# Patient Record
Sex: Female | Born: 1935 | Race: White | Hispanic: No | Smoking: Never smoker
Health system: Southern US, Community
[De-identification: ages and names within clinical notes are randomized; demographics above are authoritative.]

## PROBLEM LIST (undated history)

## (undated) DIAGNOSIS — M1990 Unspecified osteoarthritis, unspecified site: Secondary | ICD-10-CM

## (undated) DIAGNOSIS — I10 Essential (primary) hypertension: Secondary | ICD-10-CM

## (undated) DIAGNOSIS — R51 Headache: Secondary | ICD-10-CM

## (undated) DIAGNOSIS — Z8719 Personal history of other diseases of the digestive system: Secondary | ICD-10-CM

## (undated) DIAGNOSIS — R0600 Dyspnea, unspecified: Secondary | ICD-10-CM

## (undated) DIAGNOSIS — J449 Chronic obstructive pulmonary disease, unspecified: Secondary | ICD-10-CM

## (undated) DIAGNOSIS — I671 Cerebral aneurysm, nonruptured: Secondary | ICD-10-CM

## (undated) DIAGNOSIS — G4730 Sleep apnea, unspecified: Secondary | ICD-10-CM

## (undated) DIAGNOSIS — Z8672 Personal history of thrombophlebitis: Secondary | ICD-10-CM

## (undated) DIAGNOSIS — Z972 Presence of dental prosthetic device (complete) (partial): Secondary | ICD-10-CM

## (undated) DIAGNOSIS — R42 Dizziness and giddiness: Secondary | ICD-10-CM

## (undated) DIAGNOSIS — K219 Gastro-esophageal reflux disease without esophagitis: Secondary | ICD-10-CM

## (undated) DIAGNOSIS — E039 Hypothyroidism, unspecified: Secondary | ICD-10-CM

## (undated) DIAGNOSIS — J189 Pneumonia, unspecified organism: Secondary | ICD-10-CM

## (undated) DIAGNOSIS — M810 Age-related osteoporosis without current pathological fracture: Secondary | ICD-10-CM

## (undated) DIAGNOSIS — R519 Headache, unspecified: Secondary | ICD-10-CM

## (undated) DIAGNOSIS — R011 Cardiac murmur, unspecified: Secondary | ICD-10-CM

## (undated) DIAGNOSIS — F32A Depression, unspecified: Secondary | ICD-10-CM

## (undated) DIAGNOSIS — F329 Major depressive disorder, single episode, unspecified: Secondary | ICD-10-CM

## (undated) HISTORY — DX: Headache: R51

## (undated) HISTORY — DX: Depression, unspecified: F32.A

## (undated) HISTORY — DX: Headache, unspecified: R51.9

## (undated) HISTORY — PX: COLONOSCOPY: SHX174

## (undated) HISTORY — DX: Essential (primary) hypertension: I10

## (undated) HISTORY — PX: EYE SURGERY: SHX253

## (undated) HISTORY — DX: Gastro-esophageal reflux disease without esophagitis: K21.9

## (undated) HISTORY — DX: Sleep apnea, unspecified: G47.30

## (undated) HISTORY — PX: REPLACEMENT TOTAL KNEE: SUR1224

## (undated) HISTORY — PX: BREAST BIOPSY: SHX20

## (undated) HISTORY — PX: APPENDECTOMY: SHX54

---
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
## 2005-04-04 ENCOUNTER — Ambulatory Visit: Payer: Self-pay | Admitting: Physical Medicine & Rehabilitation

## 2005-04-04 ENCOUNTER — Inpatient Hospital Stay (HOSPITAL_COMMUNITY): Admission: RE | Admit: 2005-04-04 | Discharge: 2005-04-07 | Payer: Self-pay | Admitting: Orthopaedic Surgery

## 2005-04-07 ENCOUNTER — Inpatient Hospital Stay
Admission: RE | Admit: 2005-04-07 | Discharge: 2005-04-12 | Payer: Self-pay | Admitting: Physical Medicine & Rehabilitation

## 2005-04-10 HISTORY — PX: REPLACEMENT TOTAL KNEE: SUR1224

## 2005-05-04 ENCOUNTER — Encounter: Payer: Self-pay | Admitting: Orthopaedic Surgery

## 2005-05-11 ENCOUNTER — Encounter: Payer: Self-pay | Admitting: Orthopaedic Surgery

## 2005-06-10 ENCOUNTER — Encounter: Payer: Self-pay | Admitting: Orthopaedic Surgery

## 2007-05-28 ENCOUNTER — Ambulatory Visit: Payer: Self-pay | Admitting: Family Medicine

## 2007-10-07 ENCOUNTER — Ambulatory Visit: Payer: Self-pay | Admitting: Gastroenterology

## 2008-07-07 ENCOUNTER — Ambulatory Visit: Payer: Self-pay | Admitting: Family Medicine

## 2009-10-13 ENCOUNTER — Ambulatory Visit: Payer: Self-pay | Admitting: Family Medicine

## 2010-04-12 ENCOUNTER — Emergency Department: Payer: Self-pay | Admitting: Emergency Medicine

## 2010-07-20 ENCOUNTER — Ambulatory Visit: Payer: Self-pay | Admitting: Family Medicine

## 2010-08-20 ENCOUNTER — Observation Stay: Payer: Self-pay | Admitting: Internal Medicine

## 2010-09-12 ENCOUNTER — Emergency Department: Payer: Self-pay | Admitting: Emergency Medicine

## 2011-02-09 ENCOUNTER — Emergency Department: Payer: Self-pay | Admitting: Emergency Medicine

## 2011-06-15 ENCOUNTER — Emergency Department: Payer: Self-pay | Admitting: Emergency Medicine

## 2011-08-09 ENCOUNTER — Ambulatory Visit: Payer: Self-pay | Admitting: Family Medicine

## 2011-09-18 ENCOUNTER — Emergency Department: Payer: Self-pay | Admitting: Emergency Medicine

## 2012-03-28 IMAGING — NM NM MYOCARD GATED
7 series · 42 of 42 positions shown · non-contrast
Comparison: none

REASON FOR EXAM: chest pain
COMMENTS:

PROCEDURE:     NM  - NM GATED MYOCARDIAL ST SCAN [DATE]OF[DATE]  - [DATE] [DATE] [DATE] [DATE]
RESULT:
HISTORY: This is a 80-year-old with chest pain. The patient underwent a
Lexiscan infusion over 1 minute with a peak heart rate of 107 beats per
minute.  Peak systolic blood pressure 160/83, baseline 73; 150/71. The
patient had no symptoms. Baseline EKG shows normal sinus rhythm with
nonspecific ST and T-wave changes. Peak stress shows no electrocardiographic
changes or arrhythmia. The patient received 12.87 mCi of Sestamibi at rest
and 29.01 mCi of Sestamibi peak stress intravenously in the left wrist.

[Series 1000: rest mibi (recon) · 6.6mm · 6.59mm/px · 6 of 27 frames shown]
[frame 3/27]
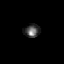
[frame 7/27]
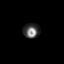
[frame 12/27]
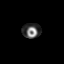
[frame 16/27]
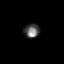
[frame 21/27]
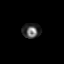
[frame 25/27]
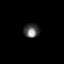

[Series 1000: gated stress myoview · 6.59mm/px · 6 of 64 frames shown]
[frame 6/64]
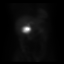
[frame 16/64]
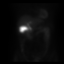
[frame 27/64]
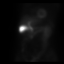
[frame 38/64]
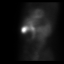
[frame 48/64]
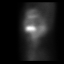
[frame 59/64]
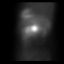

[Series 1000: rest mibi · 6.59mm/px · 6 of 64 frames shown]
[frame 6/64]
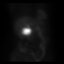
[frame 16/64]
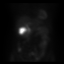
[frame 27/64]
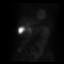
[frame 38/64]
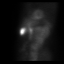
[frame 48/64]
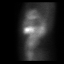
[frame 59/64]
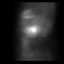

[Series 1000: gated stress myoview (recon) · 6.6mm · 6.59mm/px · 6 of 28 frames shown]
[frame 3/28]
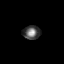
[frame 7/28]
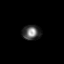
[frame 12/28]
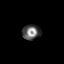
[frame 17/28]
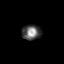
[frame 21/28]
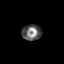
[frame 26/28]
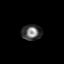

[Series 1000: gated stress myoview-gated (corrected) · 6.59mm/px · 6 of 512 frames shown]
[frame 43/512]
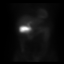
[frame 128/512]
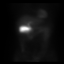
[frame 214/512]
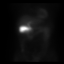
[frame 299/512]
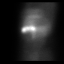
[frame 384/512]
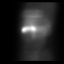
[frame 470/512]
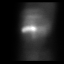

[Series 1000: gated stress myoview-gated · 6.59mm/px · 6 of 512 frames shown]
[frame 43/512]
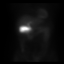
[frame 128/512]
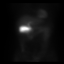
[frame 214/512]
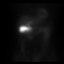
[frame 299/512]
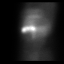
[frame 384/512]
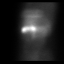
[frame 470/512]
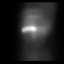

[Series 1000: gated stress myoview-gated (recon) · 6.6mm · 6.59mm/px · 6 of 224 frames shown]
[frame 19/224]
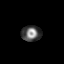
[frame 56/224]
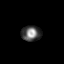
[frame 94/224]
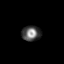
[frame 131/224]
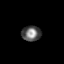
[frame 168/224]
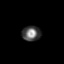
[frame 206/224]
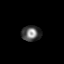

[42 of 42 positions shown; findings below may reference images not displayed]

IMAGE ANALYSIS: Normal LV systolic function with ejection fraction of 80%.
All myocardial walls have normal myocardial function and wall motion. Rest
and peak stress myocardial perfusion images were compared showing normal
myocardial perfusion at rest and peak stress without evidence of myocardial
ischemia.
IMPRESSION: 1.Normal Lexiscan infusion EKG without evidence of ischemia or arrhythmia.

2.Normal LV systolic function with ejection fraction of 80%.

## 2012-08-13 ENCOUNTER — Ambulatory Visit: Payer: Self-pay | Admitting: Family Medicine

## 2013-04-17 ENCOUNTER — Emergency Department: Payer: Self-pay | Admitting: Internal Medicine

## 2013-04-17 LAB — COMPREHENSIVE METABOLIC PANEL
Albumin: 3.3 g/dL — ABNORMAL LOW (ref 3.4–5.0)
Anion Gap: 6 — ABNORMAL LOW (ref 7–16)
BUN: 10 mg/dL (ref 7–18)
Bilirubin,Total: 0.4 mg/dL (ref 0.2–1.0)
Calcium, Total: 9 mg/dL (ref 8.5–10.1)
Chloride: 102 mmol/L (ref 98–107)
Co2: 29 mmol/L (ref 21–32)
EGFR (African American): >60
EGFR (Non-African Amer.): >60
Glucose: 87 mg/dL (ref 65–99)
Osmolality: 272 (ref 275–301)
Potassium: 3.9 mmol/L (ref 3.5–5.1)
SGOT(AST): 22 U/L (ref 15–37)
Sodium: 137 mmol/L (ref 136–145)
Total Protein: 6.8 g/dL (ref 6.4–8.2)

## 2013-04-17 LAB — PROTIME-INR
INR: 1
Prothrombin Time: 13.1 secs (ref 11.5–14.7)

## 2013-04-17 LAB — CK TOTAL AND CKMB (NOT AT ARMC)
CK, Total: 60 U/L (ref 21–215)
CK-MB: 0.9 ng/mL (ref 0.5–3.6)

## 2013-04-17 LAB — CBC
HGB: 12.8 g/dL (ref 12.0–16.0)
Platelet: 215 10*3/uL (ref 150–440)

## 2013-08-14 ENCOUNTER — Ambulatory Visit: Payer: Self-pay | Admitting: Family Medicine

## 2014-05-29 DIAGNOSIS — F419 Anxiety disorder, unspecified: Secondary | ICD-10-CM | POA: Insufficient documentation

## 2014-06-16 DIAGNOSIS — K625 Hemorrhage of anus and rectum: Secondary | ICD-10-CM | POA: Insufficient documentation

## 2014-06-16 DIAGNOSIS — R194 Change in bowel habit: Secondary | ICD-10-CM | POA: Insufficient documentation

## 2014-07-07 ENCOUNTER — Ambulatory Visit: Payer: Self-pay | Admitting: Gastroenterology

## 2014-07-09 LAB — PATHOLOGY REPORT

## 2014-10-07 ENCOUNTER — Ambulatory Visit: Payer: Self-pay | Admitting: Physician Assistant

## 2014-10-07 LAB — COMPREHENSIVE METABOLIC PANEL
ANION GAP: 5 — AB (ref 7–16)
AST: 21 U/L (ref 15–37)
Albumin: 3.4 g/dL (ref 3.4–5.0)
Alkaline Phosphatase: 57 U/L
BILIRUBIN TOTAL: 0.5 mg/dL (ref 0.2–1.0)
BUN: 9 mg/dL (ref 7–18)
CHLORIDE: 92 mmol/L — AB (ref 98–107)
Calcium, Total: 9.4 mg/dL (ref 8.5–10.1)
Co2: 30 mmol/L (ref 21–32)
Creatinine: 1.05 mg/dL (ref 0.60–1.30)
EGFR (African American): >60
GFR CALC NON AF AMER: 53 — AB
Glucose: 105 mg/dL — ABNORMAL HIGH (ref 65–99)
OSMOLALITY: 254 (ref 275–301)
Potassium: 4.6 mmol/L (ref 3.5–5.1)
SGPT (ALT): 17 U/L
SODIUM: 127 mmol/L — AB (ref 136–145)
TOTAL PROTEIN: 7.2 g/dL (ref 6.4–8.2)

## 2014-10-07 LAB — URINALYSIS, COMPLETE
Bacteria: NEGATIVE
Bilirubin,UR: NEGATIVE
Blood: NEGATIVE
GLUCOSE, UR: NEGATIVE
Ketone: NEGATIVE
LEUKOCYTE ESTERASE: NEGATIVE
Nitrite: NEGATIVE
PROTEIN: NEGATIVE
Ph: 7 (ref 5.0–8.0)
RBC, UR: NONE SEEN /HPF (ref 0–5)
SPECIFIC GRAVITY: 1.015 (ref 1.000–1.030)

## 2014-10-07 LAB — CBC WITH DIFFERENTIAL/PLATELET
Basophil #: 0 10*3/uL (ref 0.0–0.1)
Basophil %: 0.5 %
EOS ABS: 0.1 10*3/uL (ref 0.0–0.7)
Eosinophil %: 1.5 %
HCT: 39.6 % (ref 35.0–47.0)
HGB: 13.2 g/dL (ref 12.0–16.0)
LYMPHS PCT: 19.6 %
Lymphocyte #: 1.4 10*3/uL (ref 1.0–3.6)
MCH: 30.5 pg (ref 26.0–34.0)
MCHC: 33.4 g/dL (ref 32.0–36.0)
MCV: 91 fL (ref 80–100)
MONOS PCT: 9.4 %
Monocyte #: 0.7 x10 3/mm (ref 0.2–0.9)
NEUTROS ABS: 5 10*3/uL (ref 1.4–6.5)
NEUTROS PCT: 69 %
Platelet: 246 10*3/uL (ref 150–440)
RBC: 4.34 10*6/uL (ref 3.80–5.20)
RDW: 14.3 % (ref 11.5–14.5)
WBC: 7.3 10*3/uL (ref 3.6–11.0)

## 2014-11-19 ENCOUNTER — Emergency Department: Payer: Self-pay | Admitting: Internal Medicine

## 2014-11-19 LAB — CBC WITH DIFFERENTIAL/PLATELET
Basophil #: 0.1 10*3/uL (ref 0.0–0.1)
Basophil %: 0.6 %
Eosinophil #: 0.2 10*3/uL (ref 0.0–0.7)
Eosinophil %: 2.9 %
HCT: 38.1 % (ref 35.0–47.0)
HGB: 12.4 g/dL (ref 12.0–16.0)
Lymphocyte #: 1.9 10*3/uL (ref 1.0–3.6)
Lymphocyte %: 22.3 %
MCH: 30.5 pg (ref 26.0–34.0)
MCHC: 32.6 g/dL (ref 32.0–36.0)
MCV: 94 fL (ref 80–100)
Monocyte #: 0.5 x10 3/mm (ref 0.2–0.9)
Monocyte %: 5.8 %
Neutrophil #: 5.9 10*3/uL (ref 1.4–6.5)
Neutrophil %: 68.4 %
Platelet: 236 10*3/uL (ref 150–440)
RBC: 4.08 10*6/uL (ref 3.80–5.20)
RDW: 14.6 % — ABNORMAL HIGH (ref 11.5–14.5)
WBC: 8.6 10*3/uL (ref 3.6–11.0)

## 2014-11-19 LAB — BASIC METABOLIC PANEL
ANION GAP: 5 — AB (ref 7–16)
BUN: 11 mg/dL (ref 7–18)
CHLORIDE: 101 mmol/L (ref 98–107)
CREATININE: 0.89 mg/dL (ref 0.60–1.30)
Calcium, Total: 8.4 mg/dL — ABNORMAL LOW (ref 8.5–10.1)
Co2: 31 mmol/L (ref 21–32)
EGFR (African American): >60
EGFR (Non-African Amer.): >60
GLUCOSE: 101 mg/dL — AB (ref 65–99)
Osmolality: 273 (ref 275–301)
POTASSIUM: 4 mmol/L (ref 3.5–5.1)
SODIUM: 137 mmol/L (ref 136–145)

## 2014-11-19 LAB — TROPONIN I

## 2015-11-10 DIAGNOSIS — G4733 Obstructive sleep apnea (adult) (pediatric): Secondary | ICD-10-CM | POA: Insufficient documentation

## 2016-03-08 ENCOUNTER — Other Ambulatory Visit: Payer: Self-pay | Admitting: Family Medicine

## 2016-03-08 DIAGNOSIS — Z1231 Encounter for screening mammogram for malignant neoplasm of breast: Secondary | ICD-10-CM

## 2016-03-14 ENCOUNTER — Ambulatory Visit
Admission: RE | Admit: 2016-03-14 | Discharge: 2016-03-14 | Disposition: A | Discharge status: Home / Self Care | Payer: PPO | Source: Ambulatory Visit | Attending: Family Medicine | Admitting: Family Medicine

## 2016-03-14 DIAGNOSIS — Z1231 Encounter for screening mammogram for malignant neoplasm of breast: Secondary | ICD-10-CM

## 2016-04-12 ENCOUNTER — Other Ambulatory Visit: Payer: Self-pay | Admitting: Adult Health

## 2016-04-12 DIAGNOSIS — M79661 Pain in right lower leg: Secondary | ICD-10-CM

## 2016-04-12 DIAGNOSIS — M7989 Other specified soft tissue disorders: Principal | ICD-10-CM

## 2016-04-13 ENCOUNTER — Ambulatory Visit
Admission: RE | Admit: 2016-04-13 | Discharge: 2016-04-13 | Disposition: A | Discharge status: Home / Self Care | Payer: PPO | Source: Ambulatory Visit | Attending: Adult Health | Admitting: Adult Health

## 2016-04-13 DIAGNOSIS — M7989 Other specified soft tissue disorders: Secondary | ICD-10-CM | POA: Insufficient documentation

## 2016-04-13 DIAGNOSIS — M79661 Pain in right lower leg: Secondary | ICD-10-CM | POA: Insufficient documentation

## 2017-03-20 ENCOUNTER — Other Ambulatory Visit: Payer: Self-pay | Admitting: Ophthalmology

## 2017-03-20 DIAGNOSIS — H532 Diplopia: Secondary | ICD-10-CM

## 2017-03-29 ENCOUNTER — Ambulatory Visit: Payer: PPO

## 2017-03-30 ENCOUNTER — Ambulatory Visit: Payer: PPO

## 2017-04-02 ENCOUNTER — Ambulatory Visit
Admission: RE | Admit: 2017-04-02 | Discharge: 2017-04-02 | Disposition: A | Discharge status: Home / Self Care | Payer: Medicare PPO | Source: Ambulatory Visit | Attending: Ophthalmology | Admitting: Ophthalmology

## 2017-04-02 DIAGNOSIS — H532 Diplopia: Secondary | ICD-10-CM | POA: Diagnosis present

## 2017-04-02 DIAGNOSIS — I671 Cerebral aneurysm, nonruptured: Secondary | ICD-10-CM | POA: Insufficient documentation

## 2017-04-02 DIAGNOSIS — I6782 Cerebral ischemia: Secondary | ICD-10-CM | POA: Insufficient documentation

## 2017-04-02 LAB — POCT I-STAT CREATININE: Creatinine, Ser: 0.8 mg/dL (ref 0.44–1.00)

## 2017-04-02 MED ORDER — GADOBENATE DIMEGLUMINE 529 MG/ML IV SOLN
15.0000 mL | Freq: Once | INTRAVENOUS | Status: AC | PRN
  Administered 2017-04-02: 13 mL via INTRAVENOUS

## 2017-04-10 ENCOUNTER — Other Ambulatory Visit: Payer: Self-pay | Admitting: Neurosurgery

## 2017-04-10 DIAGNOSIS — I671 Cerebral aneurysm, nonruptured: Secondary | ICD-10-CM

## 2017-04-17 ENCOUNTER — Ambulatory Visit (HOSPITAL_COMMUNITY)
Admission: RE | Admit: 2017-04-17 | Discharge: 2017-04-17 | Disposition: A | Discharge status: Home / Self Care | Payer: Medicare PPO | Source: Ambulatory Visit | Attending: Neurosurgery | Admitting: Neurosurgery

## 2017-04-17 ENCOUNTER — Other Ambulatory Visit: Payer: Self-pay | Admitting: Neurosurgery

## 2017-04-17 DIAGNOSIS — I671 Cerebral aneurysm, nonruptured: Secondary | ICD-10-CM | POA: Insufficient documentation

## 2017-04-17 DIAGNOSIS — H532 Diplopia: Secondary | ICD-10-CM | POA: Insufficient documentation

## 2017-04-17 DIAGNOSIS — Z7982 Long term (current) use of aspirin: Secondary | ICD-10-CM | POA: Insufficient documentation

## 2017-04-17 DIAGNOSIS — Z79899 Other long term (current) drug therapy: Secondary | ICD-10-CM | POA: Insufficient documentation

## 2017-04-17 HISTORY — PX: IR ANGIO VERTEBRAL SEL VERTEBRAL UNI L MOD SED: IMG5367

## 2017-04-17 HISTORY — PX: IR ANGIO INTRA EXTRACRAN SEL COM CAROTID INNOMINATE BILAT MOD SED: IMG5360

## 2017-04-17 LAB — URINALYSIS, ROUTINE W REFLEX MICROSCOPIC
BACTERIA UA: NONE SEEN
Bilirubin Urine: NEGATIVE
Glucose, UA: NEGATIVE mg/dL
Hgb urine dipstick: NEGATIVE
Ketones, ur: NEGATIVE mg/dL
Nitrite: NEGATIVE
PROTEIN: NEGATIVE mg/dL
Specific Gravity, Urine: 1.011 (ref 1.005–1.030)
pH: 7 (ref 5.0–8.0)

## 2017-04-17 LAB — CBC WITH DIFFERENTIAL/PLATELET
BASOS ABS: 0.1 10*3/uL (ref 0.0–0.1)
Basophils Relative: 1 %
EOS PCT: 3 %
Eosinophils Absolute: 0.2 10*3/uL (ref 0.0–0.7)
HEMATOCRIT: 29.9 % — AB (ref 36.0–46.0)
HEMOGLOBIN: 9.2 g/dL — AB (ref 12.0–15.0)
LYMPHS PCT: 34 %
Lymphs Abs: 1.8 10*3/uL (ref 0.7–4.0)
MCH: 23.1 pg — ABNORMAL LOW (ref 26.0–34.0)
MCHC: 30.8 g/dL (ref 30.0–36.0)
MCV: 74.9 fL — AB (ref 78.0–100.0)
Monocytes Absolute: 0.4 10*3/uL (ref 0.1–1.0)
Monocytes Relative: 8 %
NEUTROS PCT: 54 %
Neutro Abs: 2.9 10*3/uL (ref 1.7–7.7)
Platelets: 250 10*3/uL (ref 150–400)
RBC: 3.99 MIL/uL (ref 3.87–5.11)
RDW: 16.7 % — AB (ref 11.5–15.5)
WBC: 5.4 10*3/uL (ref 4.0–10.5)

## 2017-04-17 LAB — BASIC METABOLIC PANEL
Anion gap: 7 (ref 5–15)
BUN: 11 mg/dL (ref 6–20)
CHLORIDE: 104 mmol/L (ref 101–111)
CO2: 28 mmol/L (ref 22–32)
Calcium: 9.2 mg/dL (ref 8.9–10.3)
Creatinine, Ser: 0.71 mg/dL (ref 0.44–1.00)
GFR calc Af Amer: >60 mL/min (ref >60)
GFR calc non Af Amer: >60 mL/min (ref >60)
GLUCOSE: 93 mg/dL (ref 65–99)
POTASSIUM: 4 mmol/L (ref 3.5–5.1)
Sodium: 139 mmol/L (ref 135–145)

## 2017-04-17 LAB — PROTIME-INR
INR: 1.08
Prothrombin Time: 14 seconds (ref 11.4–15.2)

## 2017-04-17 LAB — APTT: APTT: 33 s (ref 24–36)

## 2017-04-17 MED ORDER — LIDOCAINE HCL 1 % IJ SOLN
INTRAMUSCULAR | Status: AC | PRN
  Administered 2017-04-17: 10 mL

## 2017-04-17 MED ORDER — IOPAMIDOL (ISOVUE-300) INJECTION 61%
INTRAVENOUS | Status: AC
  Administered 2017-04-17: 50 mL
  Filled 2017-04-17: qty 150

## 2017-04-17 MED ORDER — MIDAZOLAM HCL 2 MG/2ML IJ SOLN
INTRAMUSCULAR | Status: AC
  Filled 2017-04-17: qty 2

## 2017-04-17 MED ORDER — HEPARIN SODIUM (PORCINE) 1000 UNIT/ML IJ SOLN
INTRAMUSCULAR | Status: AC
  Filled 2017-04-17: qty 3

## 2017-04-17 MED ORDER — SODIUM CHLORIDE 0.9 % IV SOLN: INTRAVENOUS | Status: DC

## 2017-04-17 MED ORDER — HEPARIN SODIUM (PORCINE) 1000 UNIT/ML IJ SOLN
INTRAMUSCULAR | Status: AC | PRN
  Administered 2017-04-17: 2000 [IU] via INTRAVENOUS

## 2017-04-17 MED ORDER — FENTANYL CITRATE (PF) 100 MCG/2ML IJ SOLN
INTRAMUSCULAR | Status: AC | PRN
  Administered 2017-04-17 (×2): 25 ug via INTRAVENOUS

## 2017-04-17 MED ORDER — MIDAZOLAM HCL 2 MG/2ML IJ SOLN
INTRAMUSCULAR | Status: AC | PRN
  Administered 2017-04-17: 0.5 mg via INTRAVENOUS
  Administered 2017-04-17: 1 mg via INTRAVENOUS

## 2017-04-17 MED ORDER — FENTANYL CITRATE (PF) 100 MCG/2ML IJ SOLN
INTRAMUSCULAR | Status: AC
  Filled 2017-04-17: qty 2

## 2017-04-17 MED ORDER — HYDROCODONE-ACETAMINOPHEN 5-325 MG PO TABS: 1.0000 | ORAL_TABLET | ORAL | Status: DC | PRN

## 2017-04-17 MED ORDER — LIDOCAINE HCL 1 % IJ SOLN
INTRAMUSCULAR | Status: AC
  Filled 2017-04-17: qty 20

## 2017-04-17 NOTE — Discharge Instructions (Signed)

## 2017-04-17 NOTE — H&P (Signed)
CC:  Aneurysm  HPI: Mrs. Ferd GlassingFarrell is a 83 year old woman I am seeing for the above at the request of Dr. 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 She initially began experiencing the double vision about 3 or 4 months ago. Initially, she was wearing fruste lenses to see if that would correct the problem, however she says that the double vision has not improved at all. She therefore underwent further testing which included MRI of the brain which demonstrated likely possibility of a right internal carotid artery aneurysm. She was therefore referred for neurosurgical evaluation. Aside from the double vision which she says is worse when looking towards the right, she does not have associated symptoms of headache, numbness, tingling, or weakness of the extremities. She has not had any difficulty walking, imbalance, or falls. She does have a daughter in law with intracranial aneurysms, but no blood relatives that she knows of. She does have a history of hypertension which appears well controlled, and is not a smoker.   PMH: No past medical history on file.  PSH: Past Surgical History:  Procedure Laterality Date  . BREAST BIOPSY Right 1970's   EXCISIONAL - NEG    SH: Social History  Substance Use Topics  . Smoking status: Not on file  . Smokeless tobacco: Not on file  . Alcohol use Not on file    MEDS: Prior to Admission medications   Not on File    ALLERGY: Allergies not on file  ROS: ROS  NEUROLOGIC EXAM: Awake, alert, oriented Memory and concentration grossly intact Speech fluent, appropriate CN grossly intact Motor exam: Upper Extremities Deltoid Bicep Tricep Grip  Right 5/5 5/5 5/5 5/5  Left 5/5 5/5 5/5 5/5   Lower Extremity IP Quad PF DF EHL  Right 5/5 5/5 5/5 5/5 5/5  Left 5/5 5/5 5/5 5/5 5/5   Sensation grossly intact to LT  [facility]: MRI of the brain was reviewed. This does demonstrate flow void in the region of  the right cavernous sinus measuring approximately 1.4 cm. This likely indicates cavernous segment right-sided internal carotid artery aneurysm.  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 I suspect this is likely due to direct compression of the 6th nerve within the cavernous sinus.  PLAN: we will plan on proceeding with diagnostic cerebral angiogram   I did review the MRI findings with the patient and her son in the office. Treatment options at this point were discussed, including no further treatment. I explained to them that given the location it is unlikely that she would suffer intracranial hemorrhage from rupture. I did also explained to them that without treatment it is unlikely that her diplopia would improve. I explained to her that treatment of the aneurysm with a flow diverter device may allow the aneurysm to regress and therefore provide some chance of improvement, although this is certainly not guaranteed. All her questions were answered, and she does wish to proceed with angiogram to gather more information.

## 2017-04-23 ENCOUNTER — Encounter (HOSPITAL_COMMUNITY): Payer: Self-pay | Admitting: Neurosurgery

## 2017-12-19 ENCOUNTER — Other Ambulatory Visit: Payer: Self-pay | Admitting: Unknown Physician Specialty

## 2017-12-19 ENCOUNTER — Ambulatory Visit
Admission: RE | Admit: 2017-12-19 | Discharge: 2017-12-19 | Disposition: A | Discharge status: Home / Self Care | Payer: Medicare PPO | Source: Ambulatory Visit | Attending: Unknown Physician Specialty | Admitting: Unknown Physician Specialty

## 2017-12-19 DIAGNOSIS — K449 Diaphragmatic hernia without obstruction or gangrene: Secondary | ICD-10-CM | POA: Insufficient documentation

## 2017-12-19 DIAGNOSIS — R05 Cough: Secondary | ICD-10-CM | POA: Diagnosis present

## 2017-12-19 DIAGNOSIS — R059 Cough, unspecified: Secondary | ICD-10-CM

## 2017-12-19 DIAGNOSIS — I700 Atherosclerosis of aorta: Secondary | ICD-10-CM | POA: Insufficient documentation

## 2017-12-19 DIAGNOSIS — R918 Other nonspecific abnormal finding of lung field: Secondary | ICD-10-CM | POA: Insufficient documentation

## 2017-12-21 ENCOUNTER — Ambulatory Visit: Payer: Medicare PPO | Admitting: Internal Medicine

## 2017-12-21 ENCOUNTER — Encounter: Payer: Self-pay | Admitting: Internal Medicine

## 2017-12-21 VITALS — BP 122/74 | HR 58 | Ht 61.5 in | Wt 142.0 lb

## 2017-12-21 DIAGNOSIS — J449 Chronic obstructive pulmonary disease, unspecified: Secondary | ICD-10-CM | POA: Diagnosis not present

## 2017-12-21 DIAGNOSIS — G4733 Obstructive sleep apnea (adult) (pediatric): Secondary | ICD-10-CM | POA: Diagnosis not present

## 2017-12-21 DIAGNOSIS — R0602 Shortness of breath: Secondary | ICD-10-CM

## 2017-12-21 DIAGNOSIS — R053 Chronic cough: Secondary | ICD-10-CM

## 2017-12-21 DIAGNOSIS — R05 Cough: Secondary | ICD-10-CM

## 2017-12-21 MED ORDER — BENZONATATE 100 MG PO CAPS: 100.0000 mg | ORAL_CAPSULE | Freq: Three times a day (TID) | ORAL | 3 refills | Status: DC

## 2017-12-21 NOTE — Progress Notes (Addendum)
[facility] Pulmonary Medicine Consultation      Assessment and Plan:  Right hilar fullness. -We will schedule CT chest.  Will contact the patient once we have results.  Chronic cough. -Chronic cough of 2 years, dry. -This has responded to Tessalon in the past, we will restart Tessalon.  Emphysema/COPD. - Demonstrated on chest x-ray with hyperinflation. -Currently the patient appears to be asymptomatic from this, we will not start inhalers at this time, if she does become symptomatically future can consider starting. -Patient remains fairly active she is encouraged to continue to do so.  Obstructive sleep apnea. - Currently on CPAP, doing well.  Addendum:  Ct chest imaging personally reviewed; large hiatal hernia, air trapping c/w emphysema, small nodules. Will start Breo, pt informed.    Date: 12/21/2017  MRN# [id number] Merlin Ege 08/03/1929   Briana Lozano is a 84 y.o. old female seen in consultation for chief complaint of:    Chief Complaint  Patient presents with  . pulmonary consult    per Dr. Jenne Campus- CXR 12/19/17. pt reports non prod cough &sob with exertion x2y, voice hoarseness x53m    HPI:  She is present with her son who gives some of the history. She has been hoarse for about 2 months and has throat pain.  She has never been diagnosed with COPD, she has had asthma in the past. She currently takes no inhalers. She gets winded with walking short distances, carrying laundry. She does have trouble with getting dressed or bathing. She takes no inhalers. She has had a cough, which has been dry for about 2 years. She was taken off ACEi, but did not help.  She has sinus drainage, she uses occasional nasal spray. She takes occasional benadryl which helps.  She has rare reflux/heartburn.  She has no pets at home.  She lives by herself, she drives a car, go a dance twice per week, working there.   She has OSA and is on CPAP for 6 months, she uses it  every night except for one night except the day she gets her hair done.    She has never smoked, no occupational exposures. Her dad and brother had lung cancer.   Imaging personally reviewed, chest x-ray 12/19/17; right hilar fullness, increased interstitial markings, chronic bronchitis changes, hiatal hernia.  Hyperinflation consistent with emphysema.     PMHX:   Past Medical History:  Diagnosis Date  . Depression   . GERD (gastroesophageal reflux disease)   . Headache   . Hiatal hernia   . Hypertension   . Obesity   . Overactive bladder   . Sleep apnea    Surgical Hx:  Past Surgical History:  Procedure Laterality Date  . BREAST BIOPSY    . BREAST BIOPSY Right    EXCISIONAL - NEG  . COLONOSCOPY    . EYE SURGERY    . GLAUCOMA SURGERY    . HERNIA REPAIR    . IR ANGIO INTRA EXTRACRAN SEL COM CAROTID INNOMINATE BILAT MOD SED  04/17/2017  . IR ANGIO VERTEBRAL SEL VERTEBRAL UNI L MOD SED  04/17/2017  . REPLACEMENT TOTAL KNEE     Family Hx:  Family History  Problem Relation Age of Onset  . Stroke Mother   . Lung cancer Father   . Lung cancer Brother   . Breast cancer Neg Hx    Social Hx:   Social History   Tobacco Use  . Smoking status: Never Smoker  . Smokeless tobacco:  Never Used  Substance Use Topics  . Alcohol use: No    Frequency: Never  . Drug use: No   Medication:    Current Outpatient Medications:  .  aspirin 325 MG tablet, Take 325 mg by mouth daily., Disp: , Rfl:  .  bisoprolol-hydrochlorothiazide (ZIAC) 5-6.25 MG tablet, Take 0.5 tablets by mouth 2 (two) times daily., Disp: , Rfl:  .  Cholecalciferol (VITAMIN D3) 1000 units CAPS, Take 1,000 Units by mouth daily., Disp: , Rfl:  .  citalopram (CELEXA) 20 MG tablet, Take 20 mg by mouth daily., Disp: , Rfl:  .  cyanocobalamin (,VITAMIN B-12,) 1000 MCG/ML injection, Inject 1,000 mcg into the muscle every 30 (thirty) days., Disp: , Rfl:  .  guaiFENesin (MUCINEX) 600 MG 12 hr tablet, Take 600 mg by mouth 2 (two)  times daily as needed for cough or to loosen phlegm., Disp: , Rfl:  .  levothyroxine (SYNTHROID, LEVOTHROID) 100 MCG tablet, Take 100 mcg by mouth daily before breakfast., Disp: , Rfl:  .  loratadine (CLARITIN) 10 MG tablet, Take 10 mg by mouth daily., Disp: , Rfl:  .  losartan (COZAAR) 100 MG tablet, Take by mouth., Disp: , Rfl:  .  Multiple Vitamins-Minerals (PRESERVISION AREDS 2 PO), Take 1 capsule by mouth 2 (two) times daily., Disp: , Rfl:  .  Polyethyl Glycol-Propyl Glycol (SYSTANE OP), Place 1 drop into both eyes daily., Disp: , Rfl:  .  polyethylene glycol (MIRALAX / GLYCOLAX) packet, Take 17 g by mouth 2 (two) times a week., Disp: , Rfl:    Allergies:  Penicillins and Valium [diazepam]  Review of Systems: Gen:  Denies  fever, sweats, chills HEENT: Denies blurred vision, double vision. bleeds, sore throat Cvc:  No dizziness, chest pain. Resp:   Denies cough or sputum production, shortness of breath Gi: Denies swallowing difficulty, stomach pain. Gu:  Denies bladder incontinence, burning urine Ext:   No Joint pain, stiffness. Skin: No skin rash,  hives  Endoc:  No polyuria, polydipsia. Psych: No depression, insomnia. Other:  All other systems were reviewed with the patient and were negative other that what is mentioned in the HPI.   Physical Examination:   VS: BP 122/74 (BP Location: Left Arm, Cuff Size: Normal)   Pulse (!) 58   Ht 5' 1.5" (1.562 m)   Wt 142 lb (64.4 kg)   SpO2 95%   BMI 26.40 kg/m   General Appearance: No distress  Neuro:without focal findings,  speech normal,  HEENT: PERRLA, EOM intact.   Pulmonary: normal breath sounds, No wheezing.  CardiovascularNormal S1,S2.  No m/r/g.   Abdomen: Benign, Soft, non-tender. Renal:  No costovertebral tenderness  GU:  No performed at this time. Endoc: No evident thyromegaly, no signs of acromegaly. Skin:   warm, no rashes, no ecchymosis  Extremities: normal, no cyanosis, clubbing.  Other findings:     LABORATORY PANEL:   CBC No results for input(s): WBC, HGB, HCT, PLT in the last 168 hours. ------------------------------------------------------------------------------------------------------------------  Chemistries  No results for input(s): NA, K, CL, CO2, GLUCOSE, BUN, CREATININE, CALCIUM, MG, AST, ALT, ALKPHOS, BILITOT in the last 168 hours.  Invalid input(s): GFRCGP ------------------------------------------------------------------------------------------------------------------  Cardiac Enzymes No results for input(s): TROPONINI in the last 168 hours. ------------------------------------------------------------  RADIOLOGY:  No results found.     Thank  you for the consultation and for allowing [facility] to assist in the care of your patient. Our recommendations are noted above.  Please contact us if we can  be of further service.   Wells Guiles, MD.  Board Certified in Internal Medicine, Pulmonary Medicine, Critical Care Medicine, and Sleep Medicine.  [facility] Office Number: [phone]  Santiago Glad, M.D.  Billy Fischer, M.D  12/21/2017

## 2017-12-21 NOTE — Patient Instructions (Signed)
Will send you for a CT chest.  

## 2018-01-04 ENCOUNTER — Other Ambulatory Visit: Payer: Self-pay | Admitting: Internal Medicine

## 2018-01-04 ENCOUNTER — Telehealth: Payer: Self-pay | Admitting: Internal Medicine

## 2018-01-04 DIAGNOSIS — Z01818 Encounter for other preprocedural examination: Secondary | ICD-10-CM

## 2018-01-04 NOTE — Telephone Encounter (Signed)
Patient calling in regards to the CT scan that was ordered Checking on status for when it will be scheduled Please call to advise

## 2018-01-04 NOTE — Progress Notes (Signed)
BMET ordered for CT scan scheduled 01/14/18.

## 2018-01-04 NOTE — Telephone Encounter (Signed)
[facility].

## 2018-01-08 ENCOUNTER — Other Ambulatory Visit
Admission: RE | Admit: 2018-01-08 | Discharge: 2018-01-08 | Disposition: A | Discharge status: Home / Self Care | Payer: Medicare PPO | Source: Ambulatory Visit | Attending: Internal Medicine | Admitting: Internal Medicine

## 2018-01-08 DIAGNOSIS — Z01818 Encounter for other preprocedural examination: Secondary | ICD-10-CM | POA: Diagnosis present

## 2018-01-08 LAB — BASIC METABOLIC PANEL
Anion gap: 7 (ref 5–15)
BUN: 10 mg/dL (ref 6–20)
CHLORIDE: 99 mmol/L — AB (ref 101–111)
CO2: 28 mmol/L (ref 22–32)
Calcium: 9 mg/dL (ref 8.9–10.3)
Creatinine, Ser: 0.73 mg/dL (ref 0.44–1.00)
GFR calc Af Amer: >60 mL/min (ref >60)
GFR calc non Af Amer: >60 mL/min (ref >60)
Glucose, Bld: 90 mg/dL (ref 65–99)
POTASSIUM: 3.9 mmol/L (ref 3.5–5.1)
SODIUM: 134 mmol/L — AB (ref 135–145)

## 2018-01-14 ENCOUNTER — Ambulatory Visit
Admission: RE | Admit: 2018-01-14 | Discharge: 2018-01-14 | Disposition: A | Discharge status: Home / Self Care | Payer: Medicare PPO | Source: Ambulatory Visit | Attending: Internal Medicine | Admitting: Internal Medicine

## 2018-01-14 DIAGNOSIS — R918 Other nonspecific abnormal finding of lung field: Secondary | ICD-10-CM | POA: Diagnosis not present

## 2018-01-14 DIAGNOSIS — R0602 Shortness of breath: Secondary | ICD-10-CM | POA: Diagnosis not present

## 2018-01-14 DIAGNOSIS — K449 Diaphragmatic hernia without obstruction or gangrene: Secondary | ICD-10-CM | POA: Insufficient documentation

## 2018-01-14 DIAGNOSIS — I700 Atherosclerosis of aorta: Secondary | ICD-10-CM | POA: Insufficient documentation

## 2018-01-14 MED ORDER — IOPAMIDOL (ISOVUE-300) INJECTION 61%
75.0000 mL | Freq: Once | INTRAVENOUS | Status: AC | PRN
  Administered 2018-01-14: 75 mL via INTRAVENOUS

## 2018-01-17 ENCOUNTER — Telehealth: Payer: Self-pay | Admitting: Internal Medicine

## 2018-01-17 DIAGNOSIS — I10 Essential (primary) hypertension: Secondary | ICD-10-CM | POA: Insufficient documentation

## 2018-01-17 DIAGNOSIS — E039 Hypothyroidism, unspecified: Secondary | ICD-10-CM | POA: Insufficient documentation

## 2018-01-17 DIAGNOSIS — I200 Unstable angina: Secondary | ICD-10-CM | POA: Insufficient documentation

## 2018-01-17 DIAGNOSIS — M810 Age-related osteoporosis without current pathological fracture: Secondary | ICD-10-CM | POA: Insufficient documentation

## 2018-01-17 DIAGNOSIS — H3530 Unspecified macular degeneration: Secondary | ICD-10-CM | POA: Insufficient documentation

## 2018-01-17 MED ORDER — FLUTICASONE FUROATE-VILANTEROL 200-25 MCG/INH IN AEPB: 1.0000 | INHALATION_SPRAY | Freq: Every day | RESPIRATORY_TRACT | 5 refills | Status: DC

## 2018-01-17 NOTE — Telephone Encounter (Signed)
Pt informed of results. Will send in Breo inhaler, please fax to her pharmacy in [place].

## 2018-01-17 NOTE — Addendum Note (Signed)
Addended by: Shane CrutchAMACHANDRAN, Leilanee Righetti on: 01/17/2018 02:23 PM   Modules accepted: Orders

## 2018-01-17 NOTE — Telephone Encounter (Signed)
done

## 2018-01-17 NOTE — Telephone Encounter (Signed)
Pt would like CT results. Please call. 

## 2018-01-18 NOTE — Telephone Encounter (Signed)
Advair Diskus change to Breo 200 mcg.

## 2018-01-21 ENCOUNTER — Telehealth: Payer: Self-pay | Admitting: Internal Medicine

## 2018-01-21 ENCOUNTER — Other Ambulatory Visit: Payer: Self-pay | Admitting: Internal Medicine

## 2018-01-21 MED ORDER — BUDESONIDE-FORMOTEROL FUMARATE 160-4.5 MCG/ACT IN AERO: 2.0000 | INHALATION_SPRAY | Freq: Two times a day (BID) | RESPIRATORY_TRACT | 6 refills | Status: DC

## 2018-01-21 MED ORDER — FLUTICASONE-SALMETEROL 250-50 MCG/DOSE IN AEPB: 1.0000 | INHALATION_SPRAY | Freq: Every day | RESPIRATORY_TRACT | 5 refills | Status: DC

## 2018-01-21 NOTE — Telephone Encounter (Signed)
Advair was the 1st medication sent then [facility]. What else can be prescribed?

## 2018-01-21 NOTE — Telephone Encounter (Signed)
Sent script for advair which should be generic.

## 2018-01-21 NOTE — Telephone Encounter (Signed)
Pt calling stating we prescribed patient Brio  It was costing her about $300   She is calling to see if there is a generic for this or if we can give her something that is lower in price  Please call back

## 2018-01-21 NOTE — Telephone Encounter (Signed)
symbicort 160 2 puffs twice daily, rinse mouth after use. If this is not covered, she will need to check with her insurance to see what medications are covered.

## 2018-01-21 NOTE — Telephone Encounter (Signed)
Pt made aware. Symbicort ordered Nothing further needed.

## 2018-01-22 ENCOUNTER — Telehealth: Payer: Self-pay | Admitting: *Deleted

## 2018-01-22 NOTE — Telephone Encounter (Signed)
PA pending Advair Diskus 250-50 mcg. Key: [id number]

## 2018-09-24 ENCOUNTER — Other Ambulatory Visit: Payer: Self-pay | Admitting: Gastroenterology

## 2018-09-24 DIAGNOSIS — R1013 Epigastric pain: Secondary | ICD-10-CM

## 2018-09-24 DIAGNOSIS — R140 Abdominal distension (gaseous): Secondary | ICD-10-CM

## 2018-09-24 DIAGNOSIS — R634 Abnormal weight loss: Secondary | ICD-10-CM

## 2018-09-24 DIAGNOSIS — R6881 Early satiety: Secondary | ICD-10-CM

## 2018-09-30 ENCOUNTER — Ambulatory Visit
Admission: RE | Admit: 2018-09-30 | Discharge: 2018-09-30 | Disposition: A | Discharge status: Home / Self Care | Payer: Medicare PPO | Source: Ambulatory Visit | Attending: Gastroenterology | Admitting: Gastroenterology

## 2018-09-30 DIAGNOSIS — K8020 Calculus of gallbladder without cholecystitis without obstruction: Secondary | ICD-10-CM | POA: Insufficient documentation

## 2018-09-30 DIAGNOSIS — I700 Atherosclerosis of aorta: Secondary | ICD-10-CM | POA: Diagnosis not present

## 2018-09-30 DIAGNOSIS — R634 Abnormal weight loss: Secondary | ICD-10-CM | POA: Diagnosis not present

## 2018-09-30 DIAGNOSIS — R6881 Early satiety: Secondary | ICD-10-CM | POA: Insufficient documentation

## 2018-09-30 DIAGNOSIS — K449 Diaphragmatic hernia without obstruction or gangrene: Secondary | ICD-10-CM | POA: Diagnosis not present

## 2018-09-30 DIAGNOSIS — M47896 Other spondylosis, lumbar region: Secondary | ICD-10-CM | POA: Diagnosis not present

## 2018-09-30 DIAGNOSIS — M4186 Other forms of scoliosis, lumbar region: Secondary | ICD-10-CM | POA: Insufficient documentation

## 2018-09-30 DIAGNOSIS — R1013 Epigastric pain: Secondary | ICD-10-CM | POA: Insufficient documentation

## 2018-09-30 DIAGNOSIS — R140 Abdominal distension (gaseous): Secondary | ICD-10-CM | POA: Diagnosis not present

## 2018-09-30 LAB — POCT I-STAT CREATININE: Creatinine, Ser: 0.8 mg/dL (ref 0.44–1.00)

## 2018-09-30 MED ORDER — IOPAMIDOL (ISOVUE-300) INJECTION 61%
80.0000 mL | Freq: Once | INTRAVENOUS | Status: AC | PRN
  Administered 2018-09-30: 85 mL via INTRAVENOUS

## 2018-10-09 ENCOUNTER — Other Ambulatory Visit: Payer: Self-pay | Admitting: Gastroenterology

## 2018-10-09 DIAGNOSIS — R935 Abnormal findings on diagnostic imaging of other abdominal regions, including retroperitoneum: Secondary | ICD-10-CM

## 2018-10-09 DIAGNOSIS — R6881 Early satiety: Secondary | ICD-10-CM

## 2018-10-09 DIAGNOSIS — R140 Abdominal distension (gaseous): Secondary | ICD-10-CM

## 2018-10-26 ENCOUNTER — Ambulatory Visit
Admission: RE | Admit: 2018-10-26 | Discharge: 2018-10-26 | Disposition: A | Discharge status: Home / Self Care | Payer: Medicare PPO | Source: Ambulatory Visit | Attending: Gastroenterology | Admitting: Gastroenterology

## 2018-10-26 DIAGNOSIS — R6881 Early satiety: Secondary | ICD-10-CM | POA: Diagnosis present

## 2018-10-26 DIAGNOSIS — R935 Abnormal findings on diagnostic imaging of other abdominal regions, including retroperitoneum: Secondary | ICD-10-CM | POA: Insufficient documentation

## 2018-10-26 DIAGNOSIS — R140 Abdominal distension (gaseous): Secondary | ICD-10-CM | POA: Diagnosis present

## 2018-10-26 MED ORDER — TECHNETIUM TC 99M SULFUR COLLOID
2.0000 | Freq: Once | INTRAVENOUS | Status: AC | PRN
  Administered 2018-10-26: 2.26 via ORAL

## 2019-01-28 ENCOUNTER — Ambulatory Visit (INDEPENDENT_AMBULATORY_CARE_PROVIDER_SITE_OTHER): Payer: Medicare Other | Admitting: Vascular Surgery

## 2019-01-28 ENCOUNTER — Encounter (INDEPENDENT_AMBULATORY_CARE_PROVIDER_SITE_OTHER): Payer: Self-pay | Admitting: Vascular Surgery

## 2019-01-28 VITALS — BP 138/76 | HR 59 | Resp 16 | Ht 61.0 in | Wt 123.4 lb

## 2019-01-28 DIAGNOSIS — I10 Essential (primary) hypertension: Secondary | ICD-10-CM | POA: Diagnosis not present

## 2019-01-28 DIAGNOSIS — R1084 Generalized abdominal pain: Secondary | ICD-10-CM

## 2019-01-28 DIAGNOSIS — R109 Unspecified abdominal pain: Secondary | ICD-10-CM | POA: Insufficient documentation

## 2019-01-28 NOTE — Assessment & Plan Note (Signed)
The patient has symptoms that are worrisome for chronic visceral ischemia with postprandial abdominal pain and weight loss.  No other obvious source of her symptoms have been found. She had a CT scan of the abdomen pelvis as part of her work-up which I have reviewed.  She does have some atherosclerosis of the aorta and branch vessels but this was a 5 mm cut study and very little information can be gathered on the visceral vessels from the study. At this point, I discussed the pathophysiology and natural history of chronic visceral ischemia.  I discussed this is a very serious and life-threatening situation if it is present.  I will obtain a mesenteric duplex in the near future at her convenience.  We will see her back following the study to discuss the results and determine further treatment options.

## 2019-01-28 NOTE — Progress Notes (Signed)
Patient ID: Briana Lozano, female   DOB: November 05, 1929, 85 y.o.   MRN: [id number]  Chief Complaint  Patient presents with  . New Patient (Initial Visit)    ref Clydene Pugh for abdominal pain    HPI Briana Lozano is a 85 y.o. female.  I am asked to see the patient by J. Woodard for evaluation of abdominal pain and weight loss.  The patient reports bloating and discomfort in her abdomen for 1 to 2 hours after eating.  This happens after most meals.  She denies any particular food or activity that seems to make it worse.  This happens after most meals.  This has been gradually progressing over months.  No clear inciting event or causative factor that started the symptoms.  She has had an extensive GI work-up without an obvious cause of her discomfort.  She had a CT scan of the abdomen pelvis as part of her work-up which I have reviewed.  She does have some atherosclerosis of the aorta and branch vessels but this was a 5 mm cut study and very little information can be gathered on the visceral vessels from the study.   Past Medical History:  Diagnosis Date  . GERD (gastroesophageal reflux disease)   . Headache   . Hypertension   . Sleep apnea     Past Surgical History:  Procedure Laterality Date  . BREAST BIOPSY    . EYE SURGERY    . IR ANGIO INTRA EXTRACRAN SEL COM CAROTID INNOMINATE BILAT MOD SED  04/17/2017  . IR ANGIO VERTEBRAL SEL VERTEBRAL UNI L MOD SED  04/17/2017  . REPLACEMENT TOTAL KNEE      Family History  Problem Relation Age of Onset  . Stroke Mother   . Lung cancer Father   . Lung cancer Brother   . Breast cancer Neg Hx     Social History Social History   Tobacco Use  . Smoking status: Never Smoker  . Smokeless tobacco: Never Used  Substance Use Topics  . Alcohol use: No    Frequency: Never  . Drug use: No    Allergies  Allergen Reactions  . Cortisone     Other reaction(s): Unknown  . Penicillins Swelling  . Valium [Diazepam] Other (See  Comments)    Couldn't walk    Current Outpatient Medications  Medication Sig Dispense Refill  . albuterol (PROVENTIL HFA;VENTOLIN HFA) 108 (90 Base) MCG/ACT inhaler Inhale into the lungs.    Marland Kitchen aspirin 325 MG tablet Take 81 mg by mouth daily.     . bisoprolol-hydrochlorothiazide (ZIAC) 5-6.25 MG tablet Take 0.5 tablets by mouth 2 (two) times daily.    . cetirizine (ZYRTEC) 10 MG tablet Take by mouth.    . cyanocobalamin (,VITAMIN B-12,) 1000 MCG/ML injection Inject 1,000 mcg into the muscle every 30 (thirty) days.    . fluticasone (FLONASE) 50 MCG/ACT nasal spray Place into the nose.    Marland Kitchen guaiFENesin (MUCINEX) 600 MG 12 hr tablet Take 600 mg by mouth 2 (two) times daily as needed for cough or to loosen phlegm.    Marland Kitchen levothyroxine (SYNTHROID, LEVOTHROID) 100 MCG tablet Take 100 mcg by mouth daily before breakfast.    . loratadine (CLARITIN) 10 MG tablet Take 10 mg by mouth daily.    Marland Kitchen losartan (COZAAR) 100 MG tablet Take 50 mg by mouth.     . metoCLOPramide (REGLAN) 5 MG tablet Take by mouth.    . montelukast (SINGULAIR) 10 MG tablet     .  Multiple Vitamins-Minerals (PRESERVISION AREDS 2 PO) Take 1 capsule by mouth 2 (two) times daily.    . naproxen sodium (ALEVE) 220 MG tablet Take by mouth.    . polyethylene glycol (MIRALAX / GLYCOLAX) packet Take 17 g by mouth 2 (two) times a week.    . Probiotic Product (PROBIOTIC DAILY PO) Take by mouth.    . Simethicone (PHAZYME PO) Take by mouth.    . benzonatate (TESSALON) 100 MG capsule Take 1 capsule (100 mg total) by mouth 3 (three) times daily. (Patient not taking: Reported on 01/28/2019) 90 capsule 3  . budesonide-formoterol (SYMBICORT) 160-4.5 MCG/ACT inhaler Inhale 2 puffs into the lungs 2 (two) times daily. (Patient not taking: Reported on 01/28/2019) 1 Inhaler 6  . Cholecalciferol (VITAMIN D3) 1000 units CAPS Take 1,000 Units by mouth daily.    . citalopram (CELEXA) 20 MG tablet Take 20 mg by mouth daily.    Bertram Gala. Polyethyl Glycol-Propyl Glycol  (SYSTANE OP) Place 1 drop into both eyes daily.     No current facility-administered medications for this visit.       REVIEW OF SYSTEMS (Negative unless checked)  Constitutional: [x] Weight loss  [] Fever  [] Chills Cardiac: [] Chest pain   [] Chest pressure   [] Palpitations   [] Shortness of breath when laying flat   [] Shortness of breath at rest   [] Shortness of breath with exertion. Vascular:  [] Pain in legs with walking   [] Pain in legs at rest   [] Pain in legs when laying flat   [] Claudication   [] Pain in feet when walking  [] Pain in feet at rest  [] Pain in feet when laying flat   [] History of DVT   [] Phlebitis   [] Swelling in legs   [] Varicose veins   [] Non-healing ulcers Pulmonary:   [] Uses home oxygen   [] Productive cough   [] Hemoptysis   [] Wheeze  [] COPD   [] Asthma Neurologic:  [] Dizziness  [] Blackouts   [] Seizures   [] History of stroke   [] History of TIA  [] Aphasia   [] Temporary blindness   [] Dysphagia   [] Weakness or numbness in arms   [] Weakness or numbness in legs Musculoskeletal:  [x] Arthritis   [] Joint swelling   [] Joint pain   [] Low back pain Hematologic:  [] Easy bruising  [] Easy bleeding   [] Hypercoagulable state   [] Anemic  [] Hepatitis Gastrointestinal:  [] Blood in stool   [] Vomiting blood  [] Gastroesophageal reflux/heartburn   [x] Abdominal pain Genitourinary:  [] Chronic kidney disease   [] Difficult urination  [] Frequent urination  [] Burning with urination   [] Hematuria Skin:  [] Rashes   [] Ulcers   [] Wounds Psychological:  [] History of anxiety   []  History of major depression.    Physical Exam BP 138/76 (BP Location: Left Arm)   Pulse (!) 59   Resp 16   Ht 5\' 1"  (1.549 m)   Wt 123 lb 6.4 oz (56 kg)   BMI 23.32 kg/m  Gen:  WD/WN, NAD. Appears younger than stated age. Head: [place]/AT, No temporalis wasting. Ear/Nose/Throat: Hearing grossly intact, nares w/o erythema or drainage, oropharynx w/o Erythema/Exudate Eyes: Conjunctiva clear, sclera non-icteric  Neck: trachea  midline.  Pulmonary:  Good air movement, respirations not labored, no use of accessory muscles Cardiac: RRR, no JVD Vascular:  Vessel Right Left  Radial Palpable Palpable                          PT 1+ Palpable Palpable  DP Palpable Palpable   Gastrointestinal: soft, non-tender/non-distended. No guarding/reflex.  Musculoskeletal: M/S 5/5  throughout.  Extremities without ischemic changes.  No deformity or atrophy. Trace LE edema. Neurologic: Sensation grossly intact in extremities.  Symmetrical.  Speech is fluent. Motor exam as listed above. Psychiatric: Judgment intact, Mood & affect appropriate for pt's clinical situation. Dermatologic: No rashes or ulcers noted.  No cellulitis or open wounds.    Radiology No results found.  Labs No results found for this or any previous visit (from the past 2160 hour(s)).  Assessment/Plan:  Benign essential hypertension blood pressure control important in reducing the progression of atherosclerotic disease. On appropriate oral medications.   Abdominal pain The patient has symptoms that are worrisome for chronic visceral ischemia with postprandial abdominal pain and weight loss.  No other obvious source of her symptoms have been found. She had a CT scan of the abdomen pelvis as part of her work-up which I have reviewed.  She does have some atherosclerosis of the aorta and branch vessels but this was a 5 mm cut study and very little information can be gathered on the visceral vessels from the study. At this point, I discussed the pathophysiology and natural history of chronic visceral ischemia.  I discussed this is a very serious and life-threatening situation if it is present.  I will obtain a mesenteric duplex in the near future at her convenience.  We will see her back following the study to discuss the results and determine further treatment options.      Festus Barren 01/28/2019, 10:50 AM   This note was created with Dragon medical  transcription system.  Any errors from dictation are unintentional.

## 2019-01-28 NOTE — Assessment & Plan Note (Signed)
blood pressure control important in reducing the progression of atherosclerotic disease. On appropriate oral medications.  

## 2019-01-28 NOTE — Patient Instructions (Signed)
Chronic Mesenteric Ischemia  Mesenteric ischemia is poor blood flow (circulation) in the vessels that supply blood to the stomach, intestines, and liver (mesenteric organs). Chronic mesenteric ischemia, also called mesenteric angina or intestinal angina, is a long-term (chronic) condition. It happens when an artery or vein that provides blood to the mesenteric organs gradually becomes blocked or narrow, restricting the blood supply to the organs. When the blood supply is severely restricted, the mesenteric organs cannot work properly. What are the causes? This condition is commonly caused by fatty deposits that build up in an artery (plaque), which can narrow the artery and restrict blood flow. Other causes include:  Weakened areas in blood vessel walls (aneurysms).  Conditions that cause twisting or inflammation of blood vessels, such as fibromuscular dysplasia or arteritis.  A disorder in which blood clots form in the veins (venous thrombosis).  Scarring and thickening (fibrosis) of blood vessels caused by radiation therapy.  A tear in the aorta, the body's main artery (aortic dissection).  Blood vessel problems after illegal drug use, such as use of cocaine.  Tumors in the nervous system (neurofibromatosis).  Certain autoimmune diseases, such as lupus. What increases the risk? The following factors may make you more likely to develop this condition:  Being female.  Being over age 50, especially if you have a history of heart problems.  Smoking.  Congestive heart failure.  Irregular heartbeat (arrhythmia).  Having a history of heart attack or stroke.  Diabetes.  High cholesterol.  High blood pressure (hypertension).  Being overweight or obese.  Kidney disease (renal disease) requiring dialysis. What are the signs or symptoms? Symptoms of this condition include:  Abdomen (abdominal) pain or cramps that develop 15-60 minutes after a meal. This pain may last for 1-3  hours. Some people may develop a fear of eating because of this symptom.  Weight loss.  Diarrhea.  Bloody stool.  Nausea.  Vomiting.  Bloating.  Abdominal pain after stress or with exercise. How is this diagnosed? This condition is diagnosed based on:  Your medical history.  A physical exam.  Tests, such as: ? Ultrasound. ? CT scan. ? Blood tests. ? Urine tests. ? An imaging test that involves injecting a dye into your arteries to show blood flow through blood vessels (angiogram). This can help to show if there are any blockages in the vessels that lead to the intestines. ? Passing a small probe through the mouth and into the stomach to measure the output of carbon dioxide (gastric tonometry). This can help to indicate whether there is decreased blood flow to the stomach and intestines. How is this treated? This condition may be treated with:  Dietary changes such as eating smaller, low-fat, meals more frequently.  Lifestyle changes to treat underlying conditions that contribute to the disease, such as high cholesterol and high blood pressure.  Medicines to reduce blood clotting and increase blood flow.  Surgery to remove the blockage, repair arteries or veins, and restore blood flow. This may involve: ? Angioplasty. This is surgery to widen the affected artery, reduce the blockage, and sometimes insert a small, mesh tube (stent). ? Bypass surgery. This may be done to go around (bypass) the blockage and reconnect healthy arteries or veins. ? Placing a stent in the affected area. This may be done to help keep blocked arteries open. Follow these instructions at home: Eating and drinking  Eat a heart-healthy diet. This includes fresh fruits and vegetables, whole grains, and lean proteins like chicken, fish, eggs,   and beans.  Avoid foods that contain a lot of: ? Salt (sodium). ? Sugar. ? Saturated fat (such as red meat). ? Trans fat (such as fried foods).  Stay  hydrated. Drink enough fluid to keep your urine clear or pale yellow. Lifestyle  Stay active and get regular exercise as told by your health care provider. Aim for 150 minutes of moderate activity or 75 minutes of vigorous activity a week. Ask your health care provider what activities and forms of exercise are safe for you.  Maintain a healthy weight.  Work with your health care provider to manage your cholesterol.  Manage any other health problems you have, such as high blood pressure, diabetes, or heart rhythm problems.  Do not use any products that contain nicotine or tobacco, such as cigarettes and e-cigarettes. If you need help quitting, ask your health care provider. General instructions  Take over-the-counter and prescription medicines only as told by your health care provider.  Keep all follow-up visits as told by your health care provider. This is important. Contact a health care provider if:  Your symptoms do not improve or they return after treatment.  You have a fever. Get help right away if:  You have severe abdominal pain.  You have severe chest pain.  You have shortness of breath.  You feel weak or dizzy.  You have palpitations.  You have numbness or weakness in your face, arm, or leg.  You are confused.  You have trouble speaking or people have trouble understanding what you are saying.  You are constipated.  You have trouble urinating.  You have blood in your stool.  You have severe nausea, vomiting, or persistent diarrhea. Summary  Mesenteric ischemia is poor circulation in the vessels that supply blood to the the stomach, intestines, and liver (mesenteric organs).  This condition happens when an artery or vein that provides blood to the mesenteric organs gradually becomes blocked or narrow, restricting the blood supply to the organs.  This condition is commonly caused by fatty deposits that build up in an artery (plaque), which can narrow the  artery and restrict blood flow.  You are more likely to develop this condition if you are over age 50 and have a history of heart problems, high blood pressure, diabetes, or high cholesterol.  This condition is usually treated with medicines, dietary and lifestyle changes, and surgery to remove the blockage, repair arteries or veins, and restore blood flow. This information is not intended to replace advice given to you by your health care provider. Make sure you discuss any questions you have with your health care provider. Document Released: 07/17/2011 Document Revised: 11/11/2016 Document Reviewed: 11/11/2016 Elsevier Interactive Patient Education  2019 [employer]  

## 2019-02-10 ENCOUNTER — Ambulatory Visit (INDEPENDENT_AMBULATORY_CARE_PROVIDER_SITE_OTHER): Payer: Medicare Other

## 2019-02-10 ENCOUNTER — Other Ambulatory Visit: Payer: Self-pay

## 2019-02-10 ENCOUNTER — Encounter (INDEPENDENT_AMBULATORY_CARE_PROVIDER_SITE_OTHER): Payer: Self-pay | Admitting: Nurse Practitioner

## 2019-02-10 ENCOUNTER — Ambulatory Visit (INDEPENDENT_AMBULATORY_CARE_PROVIDER_SITE_OTHER): Payer: Medicare Other | Admitting: Nurse Practitioner

## 2019-02-10 VITALS — BP 131/73 | HR 61 | Resp 10 | Ht 61.0 in | Wt 124.0 lb

## 2019-02-10 DIAGNOSIS — F419 Anxiety disorder, unspecified: Secondary | ICD-10-CM | POA: Diagnosis not present

## 2019-02-10 DIAGNOSIS — R1084 Generalized abdominal pain: Secondary | ICD-10-CM

## 2019-02-10 DIAGNOSIS — I10 Essential (primary) hypertension: Secondary | ICD-10-CM | POA: Diagnosis not present

## 2019-02-10 NOTE — Progress Notes (Signed)
SUBJECTIVE:  Patient ID: Briana Lozano, female    DOB: 24-May-1929, 85 y.o.   MRN: [id number] Chief Complaint  Patient presents with  . Bloated    abdominal discomfort after eating or drinking, bloating    HPI  Briana Lozano is a 85 y.o. female that presents for follow-up studies due to abdominal discomfort.  She describes more bloating rather than pain.  She said that the swelling is uncomfortable that she is unable to eat or drink.  She states that even water causes her swelling.  Since October she has lost approximately 10 pounds.  She denies any nausea or vomiting.  She is try to supplement her diet with boost high protein drinks, however these to cause significant discomfort.  She has not been able to pinpoint any food or drink that seems to make it worse.  She states that this happens after most meals.  There was a previous CT scan done of her abdomen pelvis however due to the large Size we were unable to examine the mesenteric arteries for stenosis.  She presents today for noninvasive studies.  She denies any fever, chills, chest pain or shortness of breath.  She denies any TIA-like symptoms.  She denies any changes in bowel patterns.  Today she underwent a mesenteric ultrasound which revealed no evidence of abdominal aortic aneurysm or stenosis.  The velocities were consistent with a normal celiac artery, superior mesenteric artery, inferior mesenteric artery, splenic artery and hepatic artery.  No evidence of significant stenosis is seen within vessels. Past Medical History:  Diagnosis Date  . GERD (gastroesophageal reflux disease)   . Headache   . Hypertension   . Sleep apnea     Past Surgical History:  Procedure Laterality Date  . BREAST BIOPSY    . EYE SURGERY    . IR ANGIO INTRA EXTRACRAN SEL COM CAROTID INNOMINATE BILAT MOD SED  04/17/2017  . IR ANGIO VERTEBRAL SEL VERTEBRAL UNI L MOD SED  04/17/2017  . REPLACEMENT TOTAL KNEE      Social History    Socioeconomic History  . Marital status: Divorced    Spouse name: Not on file  . Number of children: Not on file  . Years of education: Not on file  . Highest education level: Not on file  Occupational History  . Not on file  Social Needs  . Financial resource strain: Not on file  . Food insecurity:    Worry: Not on file    Inability: Not on file  . Transportation needs:    Medical: Not on file    Non-medical: Not on file  Tobacco Use  . Smoking status: Never Smoker  . Smokeless tobacco: Never Used  Substance and Sexual Activity  . Alcohol use: No    Frequency: Never  . Drug use: No  . Sexual activity: Not on file  Lifestyle  . Physical activity:    Days per week: Not on file    Minutes per session: Not on file  . Stress: Not on file  Relationships  . Social connections:    Talks on phone: Not on file    Gets together: Not on file    Attends religious service: Not on file    Active member of club or organization: Not on file    Attends meetings of clubs or organizations: Not on file    Relationship status: Not on file  . Intimate partner violence:    Fear of current or ex partner:  Not on file    Emotionally abused: Not on file    Physically abused: Not on file    Forced sexual activity: Not on file  Other Topics Concern  . Not on file  Social History Narrative  . Not on file    Family History  Problem Relation Age of Onset  . Stroke Mother   . Lung cancer Father   . Lung cancer Brother   . Breast cancer Neg Hx     Allergies  Allergen Reactions  . Cortisone     Other reaction(s): Unknown  . Penicillins Swelling  . Valium [Diazepam] Other (See Comments)    Couldn't walk     Review of Systems   Review of Systems: Negative Unless Checked Constitutional: Weight loss  Fever  Chills Cardiac: Chest pain    Atrial Fibrillation  Palpitations   Shortness of breath when laying flat   Shortness of breath with exertion. Shortness of breath  at rest Vascular:  Pain in legs with walking   Pain in legs with standing Pain in legs when laying flat   Claudication    Pain in feet when laying flat    History of DVT   Phlebitis   Swelling in legs   Varicose veins   Non-healing ulcers Pulmonary:   Uses home oxygen   Productive cough   Hemoptysis   Wheeze  COPD   Asthma Neurologic:  Dizziness   Seizures  Blackouts History of stroke   History of TIA  Aphasia   Temporary Blindness   Weakness or numbness in arm   Weakness or numbness in leg Musculoskeletal:   Joint swelling   Joint pain   Low back pain   History of Knee Replacement Arthritis back Surgeries   Spinal Stenosis    Hematologic:  Easy bruising  Easy bleeding   Hypercoagulable state   Anemic Gastrointestinal:  Diarrhea   Vomiting  Gastroesophageal reflux/heartburn   Difficulty swallowing. Abdominal pain Genitourinary:  Chronic kidney disease   Difficult urination  Anuric   Blood in urine Frequent urination  Burning with urination   Hematuria Skin:  Rashes   Ulcers Wounds Psychological:  History of anxiety    History of major depression   Memory Difficulties      OBJECTIVE:   Physical Exam  BP 131/73 (BP Location: Left Arm, Patient Position: Sitting, Cuff Size: Small)   Pulse 61   Resp 10   Ht  (1.549 m)   Wt 124 lb (56.2 kg)   BMI 23.43 kg/m   Gen: WD/WN, NAD Head: [place]/AT, No temporalis wasting.  Ear/Nose/Throat: Hearing grossly intact, nares w/o erythema or drainage Eyes: PER, EOMI, sclera nonicteric.  Neck: Supple, no masses.  No JVD.  Pulmonary:  Good air movement, no use of accessory muscles.  Cardiac: RRR Vascular:  Scattered varicosities Vessel Right Left  Radial Palpable Palpable   Gastrointestinal: soft, non-distended. No guarding/no peritoneal signs.  Musculoskeletal: M/S 5/5 throughout.  No deformity or atrophy.  Neurologic: Pain and light touch  intact in extremities.  Symmetrical.  Speech is fluent. Motor exam as listed above. Psychiatric: Judgment intact, Mood & affect appropriate for pt's clinical situation. Dermatologic: No Venous rashes. No Ulcers Noted.  No changes consistent with cellulitis. Lymph : No Cervical lymphadenopathy, no lichenification or skin changes of chronic lymphedema.       ASSESSMENT AND PLAN:  1. Generalized abdominal pain  Today she underwent a mesenteric ultrasound which revealed no evidence of abdominal aortic aneurysm or  stenosis.  The velocities were consistent with a normal celiac artery, superior mesenteric artery, inferior mesenteric artery, splenic artery and hepatic artery.  No evidence of significant stenosis is seen within vessels.   Recommend:  The patient has no evidence of chronic asymptomatic mesenteric atherosclerosis.  Given the lack of symptoms no intervention is warranted at this time.   No further invasive studies, angiography or surgery at this time  Patient will follow up on an as needed basis.   2. Benign essential hypertension Continue antihypertensive medications as already ordered, these medications have been reviewed and there are no changes at this time.   3. Anxiety Currently managed by her primary care physician, at this time it is well under control.   Current Outpatient Medications on File Prior to Visit  Medication Sig Dispense Refill  . albuterol (PROVENTIL HFA;VENTOLIN HFA) 108 (90 Base) MCG/ACT inhaler Inhale into the lungs.    . bisoprolol-hydrochlorothiazide (ZIAC) 5-6.25 MG tablet Take 0.5 tablets by mouth 2 (two) times daily.    . cetirizine (ZYRTEC) 10 MG tablet Take by mouth.    . Cholecalciferol (VITAMIN D3) 1000 units CAPS Take 1,000 Units by mouth daily.    . cyanocobalamin (,VITAMIN B-12,) 1000 MCG/ML injection Inject 1,000 mcg into the muscle every 30 (thirty) days.    . fluticasone (FLONASE) 50 MCG/ACT nasal spray Place into the nose.    Marland Kitchen  guaiFENesin (MUCINEX) 600 MG 12 hr tablet Take 600 mg by mouth 2 (two) times daily as needed for cough or to loosen phlegm.    Marland Kitchen levothyroxine (SYNTHROID, LEVOTHROID) 100 MCG tablet Take 100 mcg by mouth daily before breakfast.    . losartan (COZAAR) 100 MG tablet Take 50 mg by mouth. Every 2 or 3 days    . Multiple Vitamins-Minerals (PRESERVISION AREDS 2 PO) Take 1 capsule by mouth 2 (two) times daily.    . naproxen sodium (ALEVE) 220 MG tablet Take by mouth.    Bertram Gala Glycol-Propyl Glycol (SYSTANE OP) Place 1 drop into both eyes daily.    . polyethylene glycol (MIRALAX / GLYCOLAX) packet Take 17 g by mouth 2 (two) times a week.    Marland Kitchen aspirin 325 MG tablet Take 81 mg by mouth daily.     . benzonatate (TESSALON) 100 MG capsule Take 1 capsule (100 mg total) by mouth 3 (three) times daily. (Patient not taking: Reported on 01/28/2019) 90 capsule 3  . budesonide-formoterol (SYMBICORT) 160-4.5 MCG/ACT inhaler Inhale 2 puffs into the lungs 2 (two) times daily. (Patient not taking: Reported on 01/28/2019) 1 Inhaler 6  . citalopram (CELEXA) 20 MG tablet Take 20 mg by mouth daily.    Marland Kitchen loratadine (CLARITIN) 10 MG tablet Take 10 mg by mouth daily.    . metoCLOPramide (REGLAN) 5 MG tablet Take by mouth.    . montelukast (SINGULAIR) 10 MG tablet     . Probiotic Product (PROBIOTIC DAILY PO) Take by mouth.    . Simethicone (PHAZYME PO) Take by mouth.     No current facility-administered medications on file prior to visit.     There are no Patient Instructions on file for this visit. No follow-ups on file.   Georgiana Spinner, NP  This note was completed with Office manager.  Any errors are purely unintentional.

## 2019-05-08 ENCOUNTER — Other Ambulatory Visit: Payer: Self-pay | Admitting: Gastroenterology

## 2019-05-08 DIAGNOSIS — R1013 Epigastric pain: Secondary | ICD-10-CM

## 2019-05-08 DIAGNOSIS — R634 Abnormal weight loss: Secondary | ICD-10-CM

## 2019-05-08 DIAGNOSIS — K219 Gastro-esophageal reflux disease without esophagitis: Secondary | ICD-10-CM

## 2019-05-20 ENCOUNTER — Other Ambulatory Visit: Payer: Self-pay

## 2019-05-20 ENCOUNTER — Ambulatory Visit
Admission: RE | Admit: 2019-05-20 | Discharge: 2019-05-20 | Disposition: A | Discharge status: Home / Self Care | Payer: Medicare Other | Source: Ambulatory Visit | Attending: Gastroenterology | Admitting: Gastroenterology

## 2019-05-20 DIAGNOSIS — R1013 Epigastric pain: Secondary | ICD-10-CM | POA: Diagnosis present

## 2019-05-20 DIAGNOSIS — R634 Abnormal weight loss: Secondary | ICD-10-CM | POA: Diagnosis present

## 2019-05-20 DIAGNOSIS — K219 Gastro-esophageal reflux disease without esophagitis: Secondary | ICD-10-CM | POA: Diagnosis present

## 2019-05-23 ENCOUNTER — Other Ambulatory Visit (HOSPITAL_COMMUNITY): Payer: Self-pay | Admitting: Gastroenterology

## 2019-07-25 IMAGING — CR DG CHEST 2V
1 series · 2 of 2 positions shown · non-contrast
Comparison: Chest x-ray of October 07, 2014

CLINICAL DATA: Several month history of cough. History of
hypertension.

EXAM:
CHEST  2 VIEW

[Series 1: dg chest 2 view · 0.14mm/px · 2 of 2 slices shown]
[im 1/2]
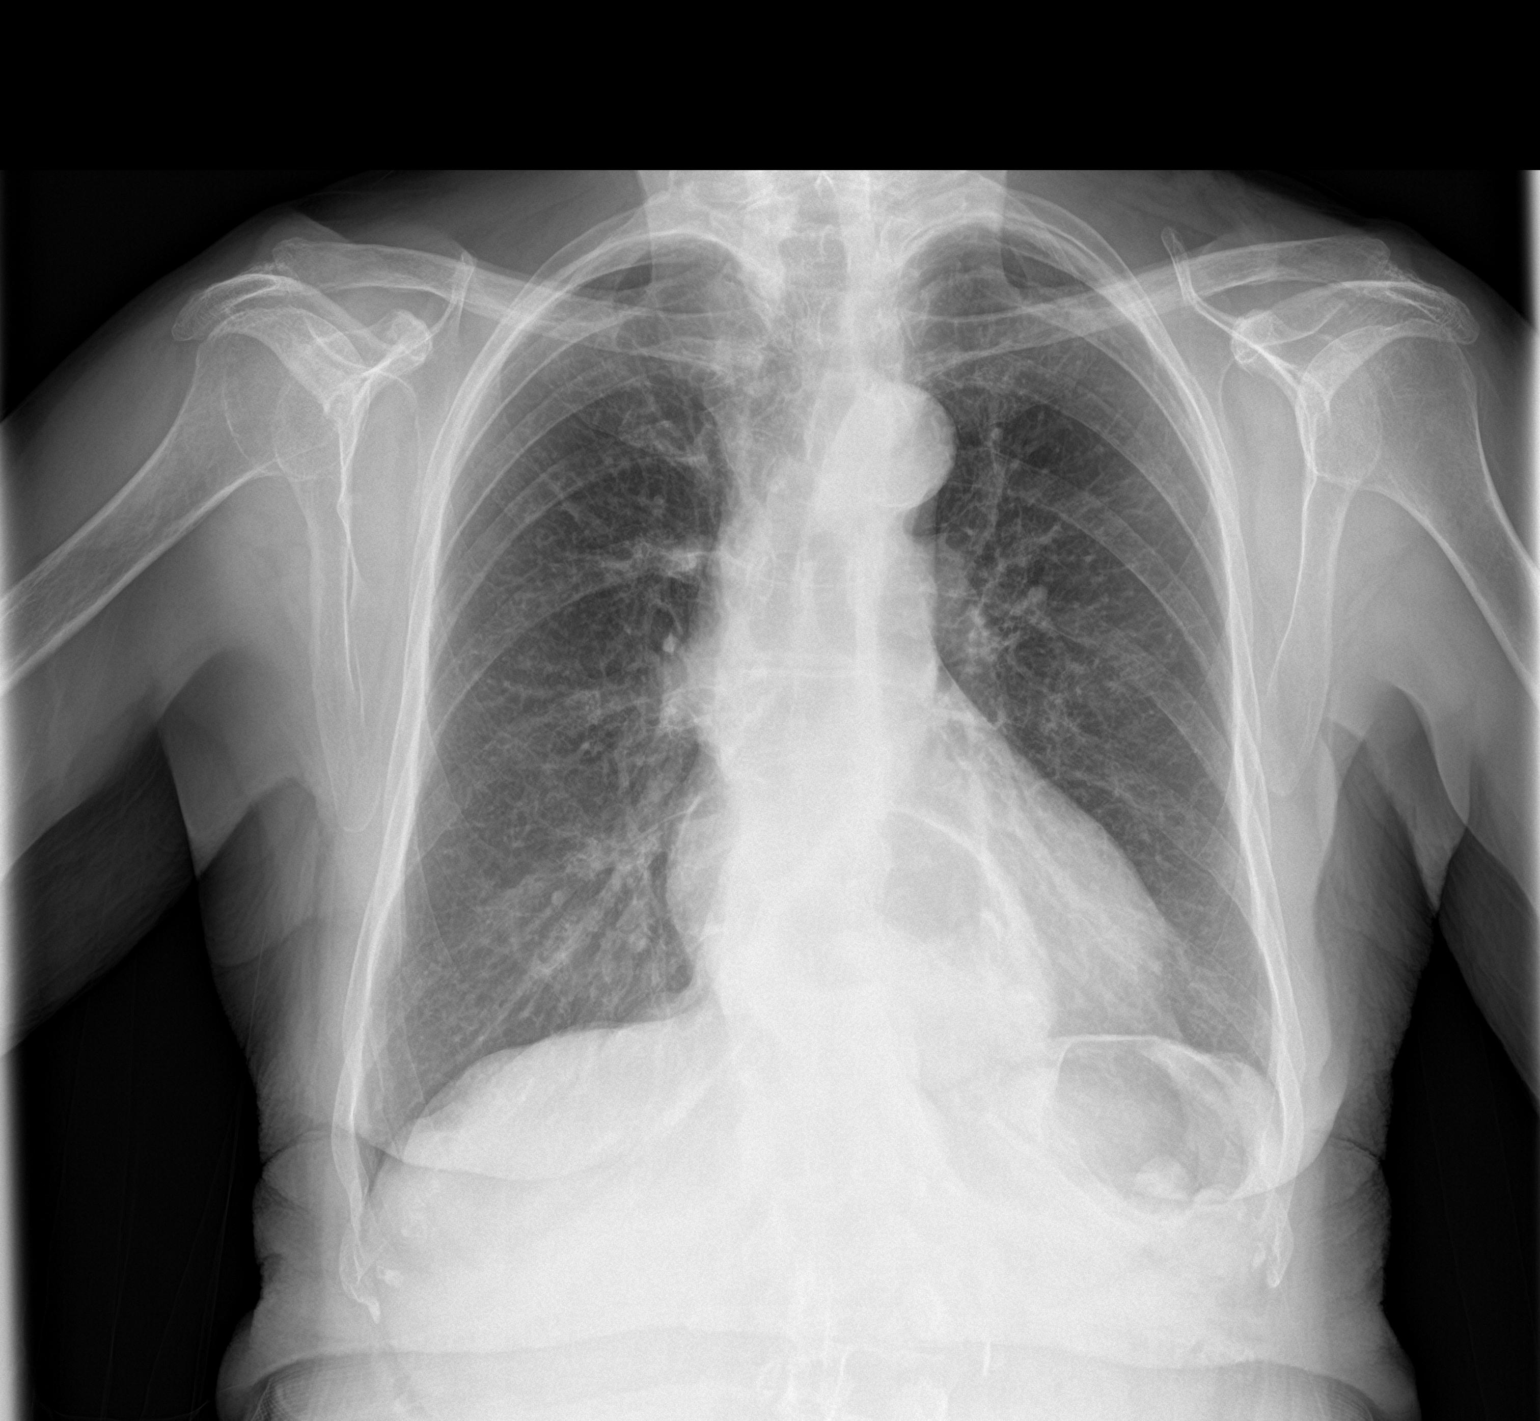
[im 2/2]
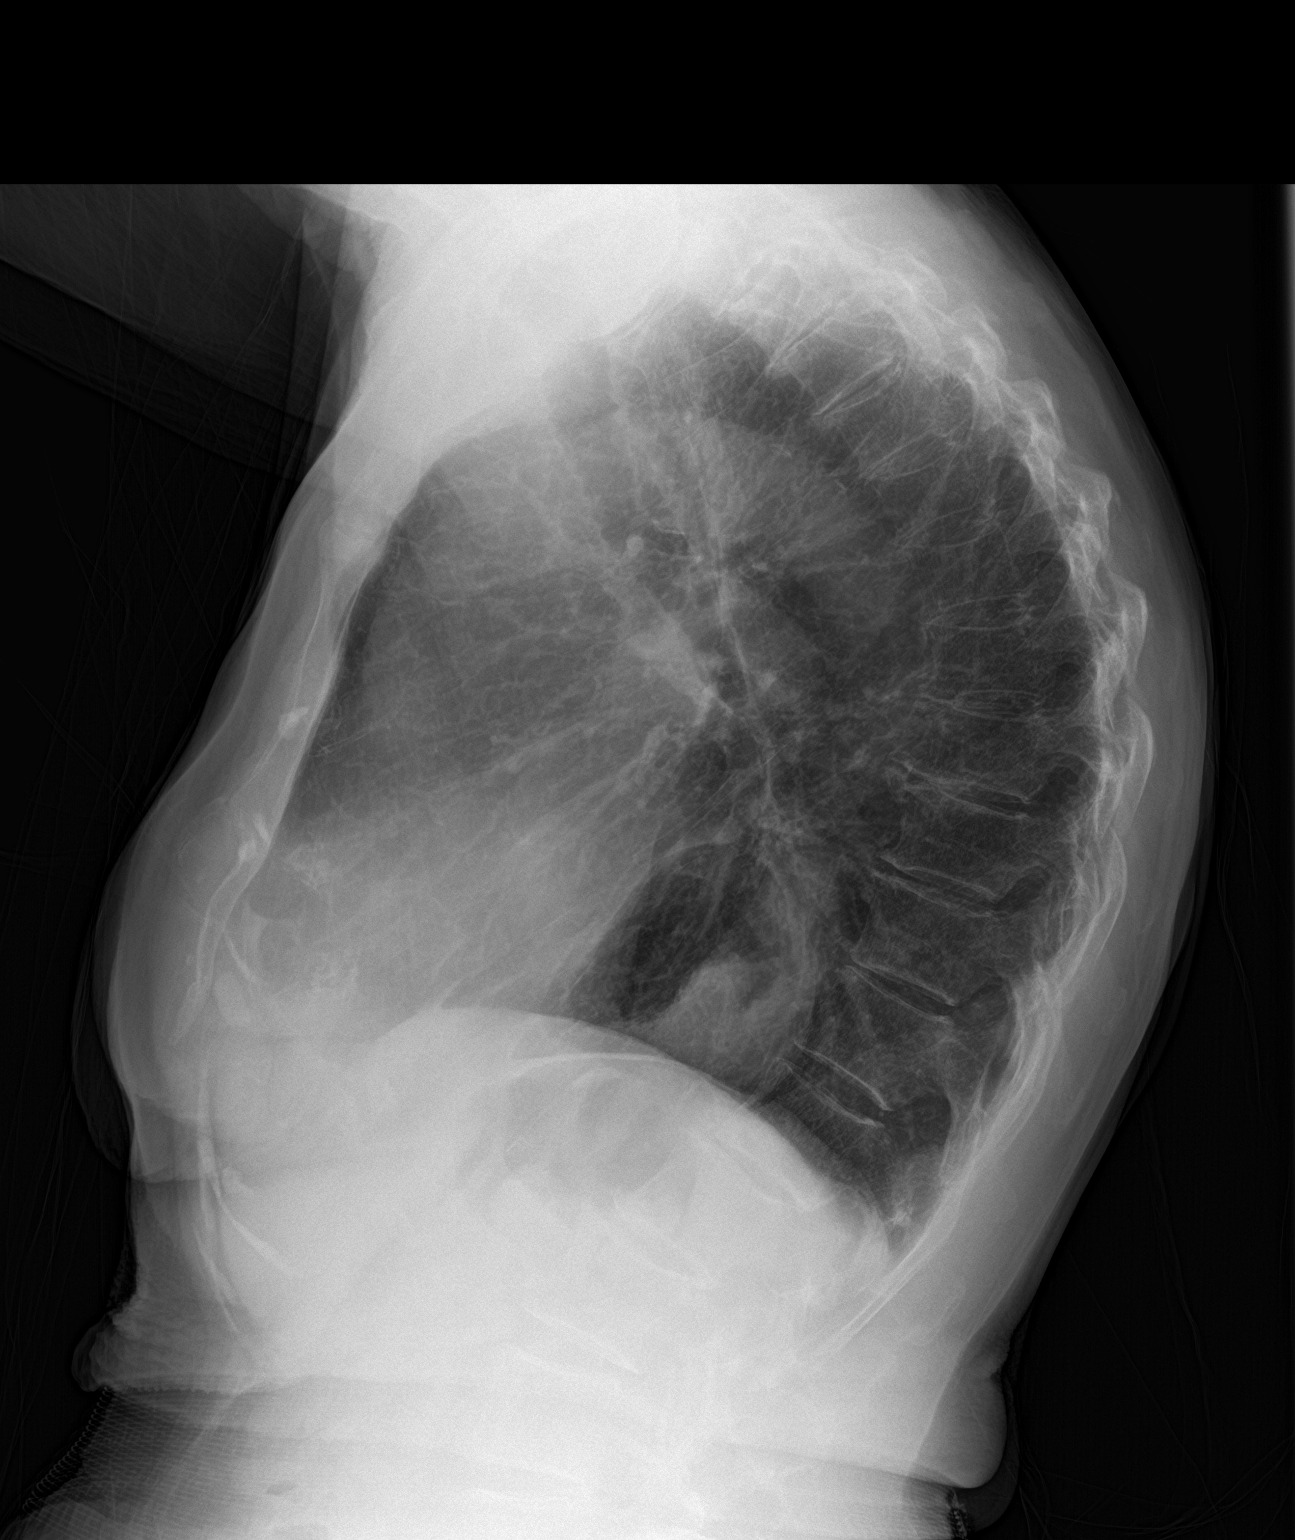

[2 of 2 positions shown; findings below may reference images not displayed]

FINDINGS: The lungs are adequately inflated. The lung markings are coarse in
the right infrahilar region. There is a large hiatal hernia. The
heart and pulmonary vascularity are normal. There calcification in
the wall of the aortic arch. There is prominent thoracic kyphosis
without thoracic compression fracture.
IMPRESSION: Chronic bronchitic changes, stable. Coarse infrahilar lung markings
on the right may reflect lower lobe atelectasis or early pneumonia.
The large hiatal hernia does place the patient at increased risk for
aspiration. Followup PA and lateral chest X-ray is recommended in
3-4 weeks following trial of antibiotic therapy to ensure resolution
and exclude underlying malignancy.

Thoracic aortic atherosclerosis.

## 2019-11-11 ENCOUNTER — Encounter: Payer: Self-pay | Admitting: *Deleted

## 2019-11-11 ENCOUNTER — Other Ambulatory Visit: Payer: Self-pay

## 2019-11-14 ENCOUNTER — Other Ambulatory Visit
Admission: RE | Admit: 2019-11-14 | Discharge: 2019-11-14 | Disposition: A | Discharge status: Home / Self Care | Payer: Medicare Other | Source: Ambulatory Visit | Attending: Ophthalmology | Admitting: Ophthalmology

## 2019-11-14 DIAGNOSIS — Z01812 Encounter for preprocedural laboratory examination: Secondary | ICD-10-CM | POA: Insufficient documentation

## 2019-11-14 DIAGNOSIS — Z20828 Contact with and (suspected) exposure to other viral communicable diseases: Secondary | ICD-10-CM | POA: Diagnosis not present

## 2019-11-14 LAB — SARS CORONAVIRUS 2 (TAT 6-24 HRS): SARS Coronavirus 2: NEGATIVE

## 2019-11-17 NOTE — Anesthesia Preprocedure Evaluation (Addendum)
Anesthesia Evaluation  Patient identified by MRN, date of birth, ID band Patient awake    Reviewed: Allergy & Precautions, NPO status , Patient's Chart, lab work & pertinent test results  History of Anesthesia Complications Negative for: history of anesthetic complications  Airway Mallampati: III   Neck ROM: Full    Dental  (+) Upper Dentures,    Pulmonary sleep apnea , COPD,    Pulmonary exam normal breath sounds clear to auscultation       Cardiovascular hypertension,  Rhythm:Regular Rate:Normal + Systolic murmurs Murmur    Neuro/Psych  Headaches, PSYCHIATRIC DISORDERS Anxiety    GI/Hepatic hiatal hernia, GERD  ,  Endo/Other  Hypothyroidism   Renal/GU negative Renal ROS     Musculoskeletal  (+) Arthritis ,   Abdominal   Peds  Hematology negative hematology ROS (+)   Anesthesia Other Findings   Reproductive/Obstetrics                            Anesthesia Physical Anesthesia Plan  ASA: II  Anesthesia Plan: MAC   Post-op Pain Management:    Induction: Intravenous  PONV Risk Score and Plan: 2 and TIVA, Midazolam and Treatment may vary due to age or medical condition  Airway Management Planned: Natural Airway  Additional Equipment:   Intra-op Plan:   Post-operative Plan:   Informed Consent: I have reviewed the patients History and Physical, chart, labs and discussed the procedure including the risks, benefits and alternatives for the proposed anesthesia with the patient or authorized representative who has indicated his/her understanding and acceptance.       Plan Discussed with: CRNA  Anesthesia Plan Comments:        Anesthesia Quick Evaluation

## 2019-11-17 NOTE — Discharge Instructions (Signed)

## 2019-11-18 ENCOUNTER — Encounter
Admission: RE | Disposition: A | Discharge status: Home / Self Care | Payer: Self-pay | Source: Home / Self Care | Attending: Ophthalmology

## 2019-11-18 ENCOUNTER — Other Ambulatory Visit: Payer: Self-pay

## 2019-11-18 ENCOUNTER — Ambulatory Visit: Payer: Medicare Other | Admitting: Anesthesiology

## 2019-11-18 ENCOUNTER — Ambulatory Visit
Admission: RE | Admit: 2019-11-18 | Discharge: 2019-11-18 | Disposition: A | Discharge status: Home / Self Care | Payer: Medicare Other | Attending: Ophthalmology | Admitting: Ophthalmology

## 2019-11-18 DIAGNOSIS — Z79899 Other long term (current) drug therapy: Secondary | ICD-10-CM | POA: Insufficient documentation

## 2019-11-18 DIAGNOSIS — H2512 Age-related nuclear cataract, left eye: Secondary | ICD-10-CM | POA: Diagnosis not present

## 2019-11-18 DIAGNOSIS — Z79890 Hormone replacement therapy (postmenopausal): Secondary | ICD-10-CM | POA: Insufficient documentation

## 2019-11-18 DIAGNOSIS — J449 Chronic obstructive pulmonary disease, unspecified: Secondary | ICD-10-CM | POA: Diagnosis not present

## 2019-11-18 DIAGNOSIS — I671 Cerebral aneurysm, nonruptured: Secondary | ICD-10-CM | POA: Diagnosis not present

## 2019-11-18 DIAGNOSIS — I10 Essential (primary) hypertension: Secondary | ICD-10-CM | POA: Diagnosis not present

## 2019-11-18 DIAGNOSIS — E039 Hypothyroidism, unspecified: Secondary | ICD-10-CM | POA: Insufficient documentation

## 2019-11-18 DIAGNOSIS — G4730 Sleep apnea, unspecified: Secondary | ICD-10-CM | POA: Insufficient documentation

## 2019-11-18 DIAGNOSIS — Z96651 Presence of right artificial knee joint: Secondary | ICD-10-CM | POA: Insufficient documentation

## 2019-11-18 DIAGNOSIS — Z7982 Long term (current) use of aspirin: Secondary | ICD-10-CM | POA: Insufficient documentation

## 2019-11-18 DIAGNOSIS — K219 Gastro-esophageal reflux disease without esophagitis: Secondary | ICD-10-CM | POA: Insufficient documentation

## 2019-11-18 HISTORY — DX: Cardiac murmur, unspecified: R01.1

## 2019-11-18 HISTORY — DX: Hypothyroidism, unspecified: E03.9

## 2019-11-18 HISTORY — DX: Unspecified osteoarthritis, unspecified site: M19.90

## 2019-11-18 HISTORY — DX: Presence of dental prosthetic device (complete) (partial): Z97.2

## 2019-11-18 HISTORY — PX: CATARACT EXTRACTION W/PHACO: SHX586

## 2019-11-18 HISTORY — DX: Age-related osteoporosis without current pathological fracture: M81.0

## 2019-11-18 HISTORY — DX: Personal history of other diseases of the digestive system: Z87.19

## 2019-11-18 HISTORY — DX: Chronic obstructive pulmonary disease, unspecified: J44.9

## 2019-11-18 SURGERY — PHACOEMULSIFICATION, CATARACT, WITH IOL INSERTION
Anesthesia: Monitor Anesthesia Care | Site: Eye | Laterality: Left

## 2019-11-18 MED ORDER — MOXIFLOXACIN HCL 0.5 % OP SOLN
OPHTHALMIC | Status: DC | PRN
  Administered 2019-11-18: 0.2 mL

## 2019-11-18 MED ORDER — ARMC OPHTHALMIC DILATING DROPS
1.0000 "application " | OPHTHALMIC | Status: DC | PRN
  Administered 2019-11-18 (×3): 1 via OPHTHALMIC

## 2019-11-18 MED ORDER — ACETAMINOPHEN 160 MG/5ML PO SOLN: 325.0000 mg | ORAL | Status: DC | PRN

## 2019-11-18 MED ORDER — TETRACAINE HCL 0.5 % OP SOLN
1.0000 [drp] | OPHTHALMIC | Status: DC | PRN
  Administered 2019-11-18 (×3): 1 [drp] via OPHTHALMIC

## 2019-11-18 MED ORDER — LIDOCAINE HCL (PF) 2 % IJ SOLN
INTRAOCULAR | Status: DC | PRN
  Administered 2019-11-18: 2 mL

## 2019-11-18 MED ORDER — BRIMONIDINE TARTRATE-TIMOLOL 0.2-0.5 % OP SOLN
OPHTHALMIC | Status: DC | PRN
  Administered 2019-11-18: 1 [drp] via OPHTHALMIC

## 2019-11-18 MED ORDER — EPINEPHRINE PF 1 MG/ML IJ SOLN
INTRAOCULAR | Status: DC | PRN
  Administered 2019-11-18: 60 mL via OPHTHALMIC

## 2019-11-18 MED ORDER — FENTANYL CITRATE (PF) 100 MCG/2ML IJ SOLN
INTRAMUSCULAR | Status: DC | PRN
  Administered 2019-11-18: 25 ug via INTRAVENOUS
  Administered 2019-11-18: 50 ug via INTRAVENOUS

## 2019-11-18 MED ORDER — ACETAMINOPHEN 325 MG PO TABS: 650.0000 mg | ORAL_TABLET | Freq: Once | ORAL | Status: DC | PRN

## 2019-11-18 MED ORDER — NA CHONDROIT SULF-NA HYALURON 40-17 MG/ML IO SOLN
INTRAOCULAR | Status: DC | PRN
  Administered 2019-11-18: 1 mL via INTRAOCULAR

## 2019-11-18 MED ORDER — ONDANSETRON HCL 4 MG/2ML IJ SOLN: 4.0000 mg | Freq: Once | INTRAMUSCULAR | Status: DC | PRN

## 2019-11-18 SURGICAL SUPPLY — 20 items
CANNULA ANT/CHMB 27G (MISCELLANEOUS) ×2 IMPLANT
CANNULA ANT/CHMB 27GA (MISCELLANEOUS) ×6 IMPLANT
GLOVE SURG LX 8.0 MICRO (GLOVE) ×2
GLOVE SURG LX STRL 8.0 MICRO (GLOVE) ×1 IMPLANT
GLOVE SURG TRIUMPH 8.0 PF LTX (GLOVE) ×3 IMPLANT
GOWN STRL REUS W/ TWL LRG LVL3 (GOWN DISPOSABLE) ×2 IMPLANT
GOWN STRL REUS W/TWL LRG LVL3 (GOWN DISPOSABLE) ×6
LENS IOL TECNIS ITEC 26.0 (Intraocular Lens) ×2 IMPLANT
MARKER SKIN DUAL TIP RULER LAB (MISCELLANEOUS) ×3 IMPLANT
NDL FILTER BLUNT 18X1 1/2 (NEEDLE) ×1 IMPLANT
NDL RETROBULBAR .5 NSTRL (NEEDLE) ×3 IMPLANT
NEEDLE FILTER BLUNT 18X 1/2SAF (NEEDLE) ×2
NEEDLE FILTER BLUNT 18X1 1/2 (NEEDLE) ×1 IMPLANT
PACK EYE AFTER SURG (MISCELLANEOUS) ×3 IMPLANT
PACK OPTHALMIC (MISCELLANEOUS) ×3 IMPLANT
PACK PORFILIO (MISCELLANEOUS) ×3 IMPLANT
SYR 3ML LL SCALE MARK (SYRINGE) ×3 IMPLANT
SYR TB 1ML LUER SLIP (SYRINGE) ×3 IMPLANT
WATER STERILE IRR 250ML POUR (IV SOLUTION) ×3 IMPLANT
WIPE NON LINTING 3.25X3.25 (MISCELLANEOUS) ×3 IMPLANT

## 2019-11-18 NOTE — OR Nursing (Signed)
Dr, Griffin Dakin into see pt, informed of elevated BP 184/94, OK'd to proceed with eye surgery.

## 2019-11-18 NOTE — Op Note (Signed)
PREOPERATIVE DIAGNOSIS:  Nuclear sclerotic cataract of the left eye.   POSTOPERATIVE DIAGNOSIS:  Nuclear sclerotic cataract of the left eye.   OPERATIVE PROCEDURE:@   SURGEON:  Birder Robson, MD.   ANESTHESIA:  Anesthesiologist: Darrin Nipper, MD CRNA: Georga Bora, CRNA  1.      Managed anesthesia care. 2.     0.3ml of Shugarcaine was instilled following the paracentesis   COMPLICATIONS:  None.   TECHNIQUE:   Stop and chop   DESCRIPTION OF PROCEDURE:  The patient was examined and consented in the preoperative holding area where the aforementioned topical anesthesia was applied to the left eye and then brought back to the Operating Room where the left eye was prepped and draped in the usual sterile ophthalmic fashion and a lid speculum was placed. A paracentesis was created with the side port blade and the anterior chamber was filled with viscoelastic. A near clear corneal incision was performed with the steel keratome. A continuous curvilinear capsulorrhexis was performed with a cystotome followed by the capsulorrhexis forceps. Hydrodissection and hydrodelineation were carried out with BSS on a blunt cannula. The lens was removed in a stop and chop  technique and the remaining cortical material was removed with the irrigation-aspiration handpiece. The capsular bag was inflated with viscoelastic and the Technis [id number] lens was placed in the capsular bag without complication. The remaining viscoelastic was removed from the eye with the irrigation-aspiration handpiece. The wounds were hydrated. The anterior chamber was flushed with BSS and the eye was inflated to physiologic pressure. 0.16ml Vigamox was placed in the anterior chamber. The wounds were found to be water tight. The eye was dressed with Combigan. The patient was given protective glasses to wear throughout the day and a shield with which to sleep tonight. The patient was also given drops with which to begin a drop regimen today and  will follow-up with me in one day. Implant Name Type Inv. Item Serial No. Manufacturer Lot No. LRB No. Used Action  LENS IOL DIOP 26.0 - [id number] Intraocular Lens LENS IOL DIOP 26.0 [id number] AMO  Left 1 Implanted    Procedure(s) with comments: CATARACT EXTRACTION PHACO AND INTRAOCULAR LENS PLACEMENT (IOC) LEFT 5.77, 00:41.2 (Left) - sleep apnea  Electronically signed: Birder Robson 11/18/2019 1:04 PM

## 2019-11-18 NOTE — Anesthesia Procedure Notes (Signed)
Procedure Name: MAC Date/Time: 11/18/2019 12:45 PM Performed by: Georga Bora, CRNA Pre-anesthesia Checklist: Patient identified, Suction available, Emergency Drugs available, Patient being monitored and Timeout performed Patient Re-evaluated:Patient Re-evaluated prior to induction Oxygen Delivery Method: Nasal cannula

## 2019-11-18 NOTE — Anesthesia Postprocedure Evaluation (Signed)
Anesthesia Post Note  Patient: Briana Lozano  Procedure(s) Performed: CATARACT EXTRACTION PHACO AND INTRAOCULAR LENS PLACEMENT (IOC) LEFT 5.77, 00:41.2 (Left Eye)     Patient location during evaluation: PACU Anesthesia Type: MAC Level of consciousness: awake and alert, oriented and patient cooperative Pain management: pain level controlled Vital Signs Assessment: post-procedure vital signs reviewed and stable Respiratory status: spontaneous breathing, nonlabored ventilation and respiratory function stable Cardiovascular status: blood pressure returned to baseline and stable Postop Assessment: adequate PO intake Anesthetic complications: no    Darrin Nipper

## 2019-11-18 NOTE — H&P (Signed)
All labs reviewed. Abnormal studies sent to patients PCP when indicated.  Previous H&P reviewed, patient examined, there are NO CHANGES.  Briana Desa Porfilio12/8/202012:27 PM

## 2019-11-18 NOTE — Transfer of Care (Signed)
Immediate Anesthesia Transfer of Care Note  Patient: Briana Lozano  Procedure(s) Performed: CATARACT EXTRACTION PHACO AND INTRAOCULAR LENS PLACEMENT (IOC) LEFT 5.77, 00:41.2 (Left Eye)  Patient Location: PACU  Anesthesia Type: MAC  Level of Consciousness: awake, alert  and patient cooperative  Airway and Oxygen Therapy: Patient Spontanous Breathing and Patient connected to supplemental oxygen  Post-op Assessment: Post-op Vital signs reviewed, Patient's Cardiovascular Status Stable, Respiratory Function Stable, Patent Airway and No signs of Nausea or vomiting  Post-op Vital Signs: Reviewed and stable  Complications: No apparent anesthesia complications

## 2019-11-19 ENCOUNTER — Encounter: Payer: Self-pay | Admitting: Ophthalmology

## 2019-12-15 ENCOUNTER — Encounter: Payer: Self-pay | Admitting: Ophthalmology

## 2019-12-16 ENCOUNTER — Encounter: Payer: Self-pay | Admitting: Anesthesiology

## 2019-12-19 ENCOUNTER — Other Ambulatory Visit
Admission: RE | Admit: 2019-12-19 | Discharge: 2019-12-19 | Disposition: A | Discharge status: Home / Self Care | Payer: Medicare Other | Source: Ambulatory Visit | Attending: Ophthalmology | Admitting: Ophthalmology

## 2019-12-19 ENCOUNTER — Other Ambulatory Visit: Payer: Self-pay

## 2019-12-19 DIAGNOSIS — U071 COVID-19: Secondary | ICD-10-CM | POA: Insufficient documentation

## 2019-12-19 DIAGNOSIS — Z01812 Encounter for preprocedural laboratory examination: Secondary | ICD-10-CM | POA: Insufficient documentation

## 2019-12-19 HISTORY — DX: COVID-19: U07.1

## 2019-12-19 LAB — SARS CORONAVIRUS 2 (TAT 6-24 HRS): SARS Coronavirus 2: POSITIVE — AB

## 2019-12-19 NOTE — Discharge Instructions (Signed)

## 2019-12-20 ENCOUNTER — Encounter: Payer: Self-pay | Admitting: Infectious Diseases

## 2019-12-23 ENCOUNTER — Ambulatory Visit: Admission: RE | Admit: 2019-12-23 | Payer: Medicare Other | Source: Home / Self Care | Admitting: Ophthalmology

## 2019-12-23 SURGERY — PHACOEMULSIFICATION, CATARACT, WITH IOL INSERTION
Anesthesia: Topical | Laterality: Right

## 2020-01-14 ENCOUNTER — Encounter: Payer: Self-pay | Admitting: Ophthalmology

## 2020-01-15 ENCOUNTER — Encounter: Payer: Self-pay | Admitting: Ophthalmology

## 2020-01-15 ENCOUNTER — Other Ambulatory Visit: Payer: Self-pay

## 2020-01-20 ENCOUNTER — Other Ambulatory Visit: Payer: Self-pay

## 2020-01-20 ENCOUNTER — Encounter
Admission: RE | Disposition: A | Discharge status: Home / Self Care | Payer: Self-pay | Source: Home / Self Care | Attending: Ophthalmology

## 2020-01-20 ENCOUNTER — Encounter: Payer: Self-pay | Admitting: Ophthalmology

## 2020-01-20 ENCOUNTER — Encounter: Payer: Self-pay | Admitting: Anesthesiology

## 2020-01-20 ENCOUNTER — Ambulatory Visit: Payer: Self-pay | Admitting: Anesthesiology

## 2020-01-20 ENCOUNTER — Ambulatory Visit
Admission: RE | Admit: 2020-01-20 | Discharge: 2020-01-20 | Disposition: A | Discharge status: Home / Self Care | Payer: Medicare Other | Attending: Ophthalmology | Admitting: Ophthalmology

## 2020-01-20 DIAGNOSIS — Z7982 Long term (current) use of aspirin: Secondary | ICD-10-CM | POA: Insufficient documentation

## 2020-01-20 DIAGNOSIS — E039 Hypothyroidism, unspecified: Secondary | ICD-10-CM | POA: Insufficient documentation

## 2020-01-20 DIAGNOSIS — Z9842 Cataract extraction status, left eye: Secondary | ICD-10-CM | POA: Diagnosis not present

## 2020-01-20 DIAGNOSIS — H2511 Age-related nuclear cataract, right eye: Secondary | ICD-10-CM | POA: Diagnosis present

## 2020-01-20 DIAGNOSIS — Z96651 Presence of right artificial knee joint: Secondary | ICD-10-CM | POA: Insufficient documentation

## 2020-01-20 DIAGNOSIS — Z79899 Other long term (current) drug therapy: Secondary | ICD-10-CM | POA: Insufficient documentation

## 2020-01-20 DIAGNOSIS — F329 Major depressive disorder, single episode, unspecified: Secondary | ICD-10-CM | POA: Diagnosis not present

## 2020-01-20 DIAGNOSIS — J449 Chronic obstructive pulmonary disease, unspecified: Secondary | ICD-10-CM | POA: Diagnosis not present

## 2020-01-20 DIAGNOSIS — F419 Anxiety disorder, unspecified: Secondary | ICD-10-CM | POA: Diagnosis not present

## 2020-01-20 DIAGNOSIS — Z79890 Hormone replacement therapy (postmenopausal): Secondary | ICD-10-CM | POA: Diagnosis not present

## 2020-01-20 DIAGNOSIS — Z8616 Personal history of COVID-19: Secondary | ICD-10-CM | POA: Diagnosis not present

## 2020-01-20 DIAGNOSIS — G4730 Sleep apnea, unspecified: Secondary | ICD-10-CM | POA: Diagnosis not present

## 2020-01-20 DIAGNOSIS — I671 Cerebral aneurysm, nonruptured: Secondary | ICD-10-CM | POA: Diagnosis not present

## 2020-01-20 DIAGNOSIS — I10 Essential (primary) hypertension: Secondary | ICD-10-CM | POA: Insufficient documentation

## 2020-01-20 HISTORY — DX: Dyspnea, unspecified: R06.00

## 2020-01-20 HISTORY — PX: CATARACT EXTRACTION W/PHACO: SHX586

## 2020-01-20 HISTORY — DX: Cerebral aneurysm, nonruptured: I67.1

## 2020-01-20 HISTORY — DX: Dizziness and giddiness: R42

## 2020-01-20 HISTORY — DX: Pneumonia, unspecified organism: J18.9

## 2020-01-20 HISTORY — DX: Personal history of thrombophlebitis: Z86.72

## 2020-01-20 SURGERY — PHACOEMULSIFICATION, CATARACT, WITH IOL INSERTION
Anesthesia: Monitor Anesthesia Care | Site: Eye | Laterality: Right

## 2020-01-20 MED ORDER — FENTANYL CITRATE (PF) 100 MCG/2ML IJ SOLN
INTRAMUSCULAR | Status: AC
  Filled 2020-01-20: qty 2

## 2020-01-20 MED ORDER — MOXIFLOXACIN HCL 0.5 % OP SOLN
OPHTHALMIC | Status: DC | PRN
  Administered 2020-01-20: 0.2 mL via OPHTHALMIC

## 2020-01-20 MED ORDER — EPINEPHRINE PF 1 MG/ML IJ SOLN
INTRAOCULAR | Status: DC | PRN
  Administered 2020-01-20: 200 mL via OPHTHALMIC

## 2020-01-20 MED ORDER — CARBACHOL 0.01 % IO SOLN
INTRAOCULAR | Status: DC | PRN
  Administered 2020-01-20: 0.5 mL via INTRAOCULAR

## 2020-01-20 MED ORDER — FENTANYL CITRATE (PF) 100 MCG/2ML IJ SOLN
INTRAMUSCULAR | Status: DC | PRN
  Administered 2020-01-20: 25 ug via INTRAVENOUS

## 2020-01-20 MED ORDER — ARMC OPHTHALMIC DILATING DROPS
1.0000 "application " | OPHTHALMIC | Status: DC | PRN
  Administered 2020-01-20 (×2): 1 via OPHTHALMIC

## 2020-01-20 MED ORDER — MOXIFLOXACIN HCL 0.5 % OP SOLN: 1.0000 [drp] | Freq: Once | OPHTHALMIC | Status: DC

## 2020-01-20 MED ORDER — SODIUM CHLORIDE 0.9 % IV SOLN: INTRAVENOUS | Status: DC

## 2020-01-20 MED ORDER — TETRACAINE HCL 0.5 % OP SOLN
1.0000 [drp] | OPHTHALMIC | Status: DC | PRN
  Administered 2020-01-20 (×2): 1 [drp] via OPHTHALMIC

## 2020-01-20 MED ORDER — LIDOCAINE HCL (PF) 4 % IJ SOLN
INTRAOCULAR | Status: DC | PRN
  Administered 2020-01-20: 2 mL via OPHTHALMIC

## 2020-01-20 MED ORDER — ARMC OPHTHALMIC DILATING DROPS
OPHTHALMIC | Status: AC
  Administered 2020-01-20: 1 via OPHTHALMIC
  Filled 2020-01-20: qty 0.5

## 2020-01-20 MED ORDER — NA CHONDROIT SULF-NA HYALURON 40-17 MG/ML IO SOLN
INTRAOCULAR | Status: DC | PRN
  Administered 2020-01-20: 1 mL via INTRAOCULAR

## 2020-01-20 MED ORDER — POVIDONE-IODINE 5 % OP SOLN
OPHTHALMIC | Status: DC | PRN
  Administered 2020-01-20: 1 via OPHTHALMIC

## 2020-01-20 MED ORDER — TETRACAINE HCL 0.5 % OP SOLN
OPHTHALMIC | Status: AC
  Administered 2020-01-20: 1 [drp] via OPHTHALMIC
  Filled 2020-01-20: qty 4

## 2020-01-20 MED ORDER — MOXIFLOXACIN HCL 0.5 % OP SOLN
OPHTHALMIC | Status: AC
  Filled 2020-01-20: qty 3

## 2020-01-20 SURGICAL SUPPLY — 16 items
GLOVE BIO SURGEON STRL SZ8 (GLOVE) ×2 IMPLANT
GLOVE BIOGEL M 6.5 STRL (GLOVE) ×2 IMPLANT
GLOVE SURG LX 8.0 MICRO (GLOVE) ×1
GLOVE SURG LX STRL 8.0 MICRO (GLOVE) ×1 IMPLANT
GOWN STRL REUS W/ TWL LRG LVL3 (GOWN DISPOSABLE) ×2 IMPLANT
GOWN STRL REUS W/TWL LRG LVL3 (GOWN DISPOSABLE) ×4
LABEL CATARACT MEDS ST (LABEL) ×2 IMPLANT
LENS IOL TECNIS ITEC 27.0 (Intraocular Lens) ×1 IMPLANT
PACK CATARACT (MISCELLANEOUS) ×2 IMPLANT
PACK CATARACT BRASINGTON LX (MISCELLANEOUS) ×2 IMPLANT
PACK EYE AFTER SURG (MISCELLANEOUS) ×2 IMPLANT
SOL BSS BAG (MISCELLANEOUS) ×2
SOLUTION BSS BAG (MISCELLANEOUS) ×1 IMPLANT
SYR 5ML LL (SYRINGE) ×2 IMPLANT
WATER STERILE IRR 250ML POUR (IV SOLUTION) ×2 IMPLANT
WIPE NON LINTING 3.25X3.25 (MISCELLANEOUS) ×2 IMPLANT

## 2020-01-20 NOTE — H&P (Signed)
All labs reviewed. Abnormal studies sent to patients PCP when indicated.  Previous H&P reviewed, patient examined, there are NO CHANGES.  Briana Laidler Porfilio2/9/202110:27 AM

## 2020-01-20 NOTE — Discharge Instructions (Addendum)
Cataract Surgery, Care After This sheet gives you information about how to care for yourself after your procedure. Your health care provider may also give you more specific instructions. If you have problems or questions, contact your health care provider. What can I expect after the procedure? After the procedure, it is common to have:  Itching.  Discomfort.  Fluid discharge.  Sensitivity to light and to touch.  Bruising in or around the eye.  Mild blurred vision. Follow these instructions at home: Eye care   Do not touch or rub your eyes.  Protect your eyes as told by your health care provider. You may be told to wear a protective eye shield or sunglasses.  Do not put a contact lens into the affected eye or eyes until your health care provider approves.  Keep the area around your eye clean and dry: ? Avoid swimming. ? Do not allow water to hit you directly in the face while showering. ? Keep soap and shampoo out of your eyes.  Check your eye every day for signs of infection. Watch for: ? Redness, swelling, or pain. ? Fluid, blood, or pus. ? Warmth. ? A bad smell. ? Vision that is getting worse. ? Sensitivity that is getting worse. Activity  Do not drive for 24 hours if you were given a sedative during your procedure.  Avoid strenuous activities, such as playing contact sports, for as long as told by your health care provider.  Do not drive or use heavy machinery until your health care provider approves.  Do not bend or lift heavy objects. Bending increases pressure in the eye. You can walk, climb stairs, and do light household chores.  Ask your health care provider when you can return to work. If you work in a dusty environment, you may be advised to wear protective eyewear for a period of time. General instructions  Take or apply over-the-counter and prescription medicines only as told by your health care provider. This includes eye drops.  Keep all follow-up  visits as told by your health care provider. This is important. Contact a health care provider if:  You have increased bruising around your eye.  You have pain that is not helped with medicine.  You have a fever.  You have redness, swelling, or pain in your eye.  You have fluid, blood, or pus coming from your incision.  Your vision gets worse.  Your sensitivity to light gets worse. Get help right away if:  You have sudden loss of vision.  You see flashes of light or spots (floaters).  You have severe eye pain.  You develop nausea or vomiting. Summary  After your procedure, it is common to have itching, discomfort, bruising, fluid discharge, or sensitivity to light.  Follow instructions from your health care provider about caring for your eye after the procedure.  Do not rub your eye after the procedure. You may need to wear eye protection or sunglasses. Do not wear contact lenses. Keep the area around your eye clean and dry.  Avoid activities that require a lot of effort. These include playing sports and lifting heavy objects.  Contact a health care provider if you have increased bruising, pain that does not go away, or a fever. Get help right away if you suddenly lose your vision, see flashes of light or spots, or have severe pain in the eye. This information is not intended to replace advice given to you by your health care provider. Make sure you discuss  any questions you have with your health care provider. Document Revised: 09/23/2019 Document Reviewed: 05/27/2018 Elsevier Patient Education  2020 [place] Anesthesia, Adult, Care After This sheet gives you information about how to care for yourself after your procedure. Your health care provider may also give you more specific instructions. If you have problems or questions, contact your health care provider. What can I expect after the procedure? After the procedure, the following side effects are  common:  Pain or discomfort at the IV site.  Nausea.  Vomiting.  Sore throat.  Trouble concentrating.  Feeling cold or chills.  Weak or tired.  Sleepiness and fatigue.  Soreness and body aches. These side effects can affect parts of the body that were not involved in surgery. Follow these instructions at home:  For at least 24 hours after the procedure:  Have a responsible adult stay with you. It is important to have someone help care for you until you are awake and alert.  Rest as needed.  Do not: ? Participate in activities in which you could fall or become injured. ? Drive. ? Use heavy machinery. ? Drink alcohol. ? Take sleeping pills or medicines that cause drowsiness. ? Make important decisions or sign legal documents. ? Take care of children on your own. Eating and drinking  Follow any instructions from your health care provider about eating or drinking restrictions.  When you feel hungry, start by eating small amounts of foods that are soft and easy to digest (bland), such as toast. Gradually return to your regular diet.  Drink enough fluid to keep your urine pale yellow.  If you vomit, rehydrate by drinking water, juice, or clear broth. General instructions  If you have sleep apnea, surgery and certain medicines can increase your risk for breathing problems. Follow instructions from your health care provider about wearing your sleep device: ? Anytime you are sleeping, including during daytime naps. ? While taking prescription pain medicines, sleeping medicines, or medicines that make you drowsy.  Return to your normal activities as told by your health care provider. Ask your health care provider what activities are safe for you.  Take over-the-counter and prescription medicines only as told by your health care provider.  If you smoke, do not smoke without supervision.  Keep all follow-up visits as told by your health care provider. This is  important. Contact a health care provider if:  You have nausea or vomiting that does not get better with medicine.  You cannot eat or drink without vomiting.  You have pain that does not get better with medicine.  You are unable to pass urine.  You develop a skin rash.  You have a fever.  You have redness around your IV site that gets worse. Get help right away if:  You have difficulty breathing.  You have chest pain.  You have blood in your urine or stool, or you vomit blood. Summary  After the procedure, it is common to have a sore throat or nausea. It is also common to feel tired.  Have a responsible adult stay with you for the first 24 hours after general anesthesia. It is important to have someone help care for you until you are awake and alert.  When you feel hungry, start by eating small amounts of foods that are soft and easy to digest (bland), such as toast. Gradually return to your regular diet.  Drink enough fluid to keep your urine pale yellow.  Return to  your normal activities as told by your health care provider. Ask your health care provider what activities are safe for you. This information is not intended to replace advice given to you by your health care provider. Make sure you discuss any questions you have with your health care provider. Document Revised: 11/30/2017 Document Reviewed: 07/13/2017 Elsevier Patient Education  2020 [employer]   Eye Surgery Discharge Instructions  Expect mild scratchy sensation or mild soreness. DO NOT RUB YOUR EYE!  The day of surgery: . Minimal physical activity, but bed rest is not required . No reading, computer work, or close hand work . No bending, lifting, or straining. . May watch TV  For 24 hours: . No driving, legal decisions, or alcoholic beverages . Safety precautions . Eat anything you prefer: It is better to start with liquids, then soup then solid foods. *     Solar shield eyeglasses should be  worn for comfort in the sunlight/patch while sleeping  Resume all regular medications including aspirin or Coumadin if these were discontinued prior to surgery. You may shower, bathe, shave, or wash your hair. Tylenol may be taken for mild discomfort. Follow Dr. Gerome Sam eye drop instruction sheet as reviewed.  Call your doctor if you experience significant pain, nausea, or vomiting, fever > 101 or other signs of infection. [phone] or [phone] Specific instructions:  Follow-up Information    Galen Manila, MD. Go on 01/21/2020.   Specialty: Ophthalmology Why: at 10:45 am  Contact information: [address] [place] [address] [phone]

## 2020-01-20 NOTE — Transfer of Care (Signed)
Immediate Anesthesia Transfer of Care Note  Patient: Briana Lozano  Procedure(s) Performed: CATARACT EXTRACTION PHACO AND INTRAOCULAR LENS PLACEMENT (IOC) RIGHT (Right Eye)  Patient Location: Short Stay  Anesthesia Type:MAC  Level of Consciousness: awake, alert  and oriented  Airway & Oxygen Therapy: Patient Spontanous Breathing  Post-op Assessment: Post -op Vital signs reviewed and stable  Post vital signs: stable  Last Vitals:  Vitals Value Taken Time  BP 149/66 01/20/20 1057  Temp 36.4 C 01/20/20 1057  Pulse 61 01/20/20 1057  Resp 16 01/20/20 1057  SpO2 100 % 01/20/20 1057    Last Pain:  Vitals:   01/20/20 1057  TempSrc: Temporal  PainSc: 0-No pain         Complications: No apparent anesthesia complications

## 2020-01-20 NOTE — Op Note (Signed)
PREOPERATIVE DIAGNOSIS:  Nuclear sclerotic cataract of the right eye.   POSTOPERATIVE DIAGNOSIS:  H25.11 Cataract   OPERATIVE PROCEDURE: Procedure(s): CATARACT EXTRACTION PHACO AND INTRAOCULAR LENS PLACEMENT (IOC) RIGHT   SURGEON:  Galen Manila, MD.   ANESTHESIA:  Anesthesiologist: Lenard Simmer, MD CRNA: Irving Burton, CRNA  1.      Managed anesthesia care. 2.      0.7ml of Shugarcaine was instilled in the eye following the paracentesis.   COMPLICATIONS:  None.   TECHNIQUE:   Stop and chop   DESCRIPTION OF PROCEDURE:  The patient was examined and consented in the preoperative holding area where the aforementioned topical anesthesia was applied to the right eye and then brought back to the Operating Room where the right eye was prepped and draped in the usual sterile ophthalmic fashion and a lid speculum was placed. A paracentesis was created with the side port blade and the anterior chamber was filled with viscoelastic. A near clear corneal incision was performed with the steel keratome. A continuous curvilinear capsulorrhexis was performed with a cystotome followed by the capsulorrhexis forceps. Hydrodissection and hydrodelineation were carried out with BSS on a blunt cannula. The lens was removed in a stop and chop  technique and the remaining cortical material was removed with the irrigation-aspiration handpiece. The capsular bag was inflated with viscoelastic and the Technis [id number]  lens was placed in the capsular bag without complication. The remaining viscoelastic was removed from the eye with the irrigation-aspiration handpiece. The wounds were hydrated. The anterior chamber was flushed with Miostat and the eye was inflated to physiologic pressure. 0.52ml of Vigamox was placed in the anterior chamber. The wounds were found to be water tight. The eye was dressed with Vigamox. The patient was given protective glasses to wear throughout the day and a shield with which to sleep  tonight. The patient was also given drops with which to begin a drop regimen today and will follow-up with me in one day. Implant Name Type Inv. Item Serial No. Manufacturer Lot No. LRB No. Used Action  LENS IOL DIOP 27.0 - [id number] Intraocular Lens LENS IOL DIOP 27.0 [id number]  Right 1 Implanted   Procedure(s) with comments: CATARACT EXTRACTION PHACO AND INTRAOCULAR LENS PLACEMENT (IOC) RIGHT (Right) - Lot #[id number] H [place]: 07:12 CDE: 00:59.3   Electronically signed: Galen Manila 01/20/2020 10:54 AM

## 2020-01-20 NOTE — Anesthesia Postprocedure Evaluation (Signed)
Anesthesia Post Note  Patient: Briana Lozano  Procedure(s) Performed: CATARACT EXTRACTION PHACO AND INTRAOCULAR LENS PLACEMENT (IOC) RIGHT (Right Eye)  Patient location during evaluation: Short Stay Anesthesia Type: MAC Level of consciousness: awake and alert Pain management: pain level controlled Vital Signs Assessment: post-procedure vital signs reviewed and stable Respiratory status: spontaneous breathing, nonlabored ventilation, respiratory function stable and patient connected to nasal cannula oxygen Cardiovascular status: stable and blood pressure returned to baseline Postop Assessment: no apparent nausea or vomiting Anesthetic complications: no     Last Vitals:  Vitals:   01/20/20 0851 01/20/20 1057  BP: (!) 148/69 (!) 149/66  Pulse: (!) 58 61  Resp: 16 16  Temp: (!) 36.3 C 36.4 C  SpO2: 98% 100%    Last Pain:  Vitals:   01/20/20 1057  TempSrc: Temporal  PainSc: 0-No pain                 Estill Batten

## 2020-01-20 NOTE — Anesthesia Preprocedure Evaluation (Signed)
Anesthesia Evaluation  Patient identified by MRN, date of birth, ID band Patient awake    Reviewed: Allergy & Precautions, NPO status , Patient's Chart, lab work & pertinent test results  History of Anesthesia Complications Negative for: history of anesthetic complications  Airway Mallampati: III   Neck ROM: Full    Dental  (+) Upper Dentures,    Pulmonary sleep apnea , COPD, Recent URI  (recent COVID+),    Pulmonary exam normal breath sounds clear to auscultation       Cardiovascular hypertension,  Rhythm:Regular Rate:Normal + Systolic murmurs Murmur    Neuro/Psych  Headaches, PSYCHIATRIC DISORDERS Anxiety Depression    GI/Hepatic hiatal hernia, GERD  ,  Endo/Other  Hypothyroidism   Renal/GU negative Renal ROS     Musculoskeletal  (+) Arthritis ,   Abdominal   Peds  Hematology negative hematology ROS (+)   Anesthesia Other Findings Past Medical History: No date: Arthritis     Comment:  fingers No date: Brain aneurysm No date: COPD (chronic obstructive pulmonary disease) (HCC)     Comment:  wheezing occasionally 12/19/2019: COVID-19     Comment:  mild symptoms No date: Depression No date: Dyspnea     Comment:  with exertion No date: GERD (gastroesophageal reflux disease) No date: Headache No date: Heart murmur     Comment:  followed by PCP No date: History of hiatal hernia No date: History of phlebitis No date: Hypertension No date: Hypothyroidism No date: Osteoporosis No date: Pneumonia     Comment:  in PAST No date: Sleep apnea     Comment:  no CPAP No date: Vertigo     Comment:  in past No date: Wears dentures     Comment:  full upper   Reproductive/Obstetrics                             Anesthesia Physical  Anesthesia Plan  ASA: II  Anesthesia Plan: MAC   Post-op Pain Management:    Induction: Intravenous  PONV Risk Score and Plan: 2 and TIVA, Midazolam  and Treatment may vary due to age or medical condition  Airway Management Planned: Natural Airway  Additional Equipment:   Intra-op Plan:   Post-operative Plan:   Informed Consent: I have reviewed the patients History and Physical, chart, labs and discussed the procedure including the risks, benefits and alternatives for the proposed anesthesia with the patient or authorized representative who has indicated his/her understanding and acceptance.       Plan Discussed with: CRNA  Anesthesia Plan Comments:         Anesthesia Quick Evaluation

## 2020-05-20 ENCOUNTER — Other Ambulatory Visit: Payer: Self-pay | Admitting: Gastroenterology

## 2020-05-20 DIAGNOSIS — R1013 Epigastric pain: Secondary | ICD-10-CM

## 2020-05-20 DIAGNOSIS — K8020 Calculus of gallbladder without cholecystitis without obstruction: Secondary | ICD-10-CM

## 2020-05-31 ENCOUNTER — Ambulatory Visit
Admission: RE | Admit: 2020-05-31 | Discharge: 2020-05-31 | Disposition: A | Discharge status: Home / Self Care | Payer: Medicare Other | Source: Ambulatory Visit | Attending: Gastroenterology | Admitting: Gastroenterology

## 2020-05-31 ENCOUNTER — Other Ambulatory Visit: Payer: Self-pay

## 2020-05-31 DIAGNOSIS — K8020 Calculus of gallbladder without cholecystitis without obstruction: Secondary | ICD-10-CM | POA: Diagnosis present

## 2020-05-31 DIAGNOSIS — R1013 Epigastric pain: Secondary | ICD-10-CM | POA: Insufficient documentation

## 2020-05-31 MED ORDER — TECHNETIUM TC 99M MEBROFENIN IV KIT
5.3200 | PACK | Freq: Once | INTRAVENOUS | Status: AC | PRN
  Administered 2020-05-31: 5.32 via INTRAVENOUS

## 2021-09-06 DIAGNOSIS — I10 Essential (primary) hypertension: Secondary | ICD-10-CM | POA: Insufficient documentation

## 2021-09-06 DIAGNOSIS — Z5321 Procedure and treatment not carried out due to patient leaving prior to being seen by health care provider: Secondary | ICD-10-CM | POA: Insufficient documentation

## 2021-09-06 DIAGNOSIS — R519 Headache, unspecified: Secondary | ICD-10-CM | POA: Insufficient documentation

## 2021-09-06 DIAGNOSIS — R112 Nausea with vomiting, unspecified: Secondary | ICD-10-CM | POA: Insufficient documentation

## 2021-09-06 LAB — CBC WITH DIFFERENTIAL/PLATELET
Abs Immature Granulocytes: 0.02 10*3/uL (ref 0.00–0.07)
Basophils Absolute: 0 10*3/uL (ref 0.0–0.1)
Basophils Relative: 0 %
Eosinophils Absolute: 0 10*3/uL (ref 0.0–0.5)
Eosinophils Relative: 0 %
HCT: 42 % (ref 36.0–46.0)
Hemoglobin: 15.4 g/dL — ABNORMAL HIGH (ref 12.0–15.0)
Immature Granulocytes: 0 %
Lymphocytes Relative: 25 %
Lymphs Abs: 2.2 10*3/uL (ref 0.7–4.0)
MCH: 36.1 pg — ABNORMAL HIGH (ref 26.0–34.0)
MCHC: 36.7 g/dL — ABNORMAL HIGH (ref 30.0–36.0)
MCV: 98.4 fL (ref 80.0–100.0)
Monocytes Absolute: 0.6 10*3/uL (ref 0.1–1.0)
Monocytes Relative: 6 %
Neutro Abs: 5.8 10*3/uL (ref 1.7–7.7)
Neutrophils Relative %: 69 %
Platelets: 190 10*3/uL (ref 150–400)
RBC: 4.27 MIL/uL (ref 3.87–5.11)
RDW: 12.9 % (ref 11.5–15.5)
WBC: 8.5 10*3/uL (ref 4.0–10.5)
nRBC: 0 % (ref 0.0–0.2)

## 2021-09-06 LAB — URINALYSIS, ROUTINE W REFLEX MICROSCOPIC
Bilirubin Urine: NEGATIVE
Glucose, UA: NEGATIVE mg/dL
Hgb urine dipstick: NEGATIVE
Ketones, ur: NEGATIVE mg/dL
Leukocytes,Ua: NEGATIVE
Nitrite: NEGATIVE
Protein, ur: NEGATIVE mg/dL
Specific Gravity, Urine: 1.012 (ref 1.005–1.030)
pH: 6 (ref 5.0–8.0)

## 2021-09-06 LAB — COMPREHENSIVE METABOLIC PANEL
ALT: 15 U/L (ref 0–44)
AST: 26 U/L (ref 15–41)
Albumin: 4 g/dL (ref 3.5–5.0)
Alkaline Phosphatase: 55 U/L (ref 38–126)
Anion gap: 11 (ref 5–15)
BUN: 10 mg/dL (ref 8–23)
CO2: 24 mmol/L (ref 22–32)
Calcium: 9.2 mg/dL (ref 8.9–10.3)
Chloride: 94 mmol/L — ABNORMAL LOW (ref 98–111)
Creatinine, Ser: 0.79 mg/dL (ref 0.44–1.00)
GFR, Estimated: >60 mL/min (ref >60)
Glucose, Bld: 135 mg/dL — ABNORMAL HIGH (ref 70–99)
Potassium: 4 mmol/L (ref 3.5–5.1)
Sodium: 129 mmol/L — ABNORMAL LOW (ref 135–145)
Total Bilirubin: 1 mg/dL (ref 0.3–1.2)
Total Protein: 7.7 g/dL (ref 6.5–8.1)

## 2021-09-06 LAB — TROPONIN I (HIGH SENSITIVITY): Troponin I (High Sensitivity): 8 ng/L (ref <18)

## 2021-09-06 NOTE — ED Triage Notes (Signed)
Pt states she has been feeling poorly for a few days. Complains of n/v, HA tha travels along right side of head.  Light sensitivity that started today.  Has tried multiple otc HA meds wo relief. Son gave 37 of losartin this am and double beta blocker this evening per PCP prior recommendations.

## 2021-09-07 ENCOUNTER — Emergency Department
Admission: EM | Admit: 2021-09-07 | Discharge: 2021-09-07 | Disposition: A | Discharge status: Home / Self Care | Payer: Medicare Other | Source: Home / Self Care

## 2021-09-07 ENCOUNTER — Emergency Department
Admission: EM | Admit: 2021-09-07 | Discharge: 2021-09-07 | Disposition: A | Discharge status: Home / Self Care | Payer: Medicare Other | Source: Home / Self Care | Attending: Emergency Medicine | Admitting: Emergency Medicine

## 2021-09-07 ENCOUNTER — Ambulatory Visit: Admission: RE | Admit: 2021-09-07 | Payer: Medicare Other | Source: Ambulatory Visit

## 2021-09-07 ENCOUNTER — Emergency Department: Payer: Medicare Other

## 2021-09-07 ENCOUNTER — Other Ambulatory Visit: Payer: Self-pay

## 2021-09-07 DIAGNOSIS — I10 Essential (primary) hypertension: Secondary | ICD-10-CM | POA: Insufficient documentation

## 2021-09-07 DIAGNOSIS — Z8616 Personal history of COVID-19: Secondary | ICD-10-CM | POA: Insufficient documentation

## 2021-09-07 DIAGNOSIS — R519 Headache, unspecified: Secondary | ICD-10-CM | POA: Insufficient documentation

## 2021-09-07 DIAGNOSIS — Z7982 Long term (current) use of aspirin: Secondary | ICD-10-CM | POA: Insufficient documentation

## 2021-09-07 DIAGNOSIS — K219 Gastro-esophageal reflux disease without esophagitis: Secondary | ICD-10-CM | POA: Insufficient documentation

## 2021-09-07 DIAGNOSIS — E039 Hypothyroidism, unspecified: Secondary | ICD-10-CM | POA: Insufficient documentation

## 2021-09-07 DIAGNOSIS — R1010 Upper abdominal pain, unspecified: Secondary | ICD-10-CM | POA: Insufficient documentation

## 2021-09-07 DIAGNOSIS — Z20822 Contact with and (suspected) exposure to covid-19: Secondary | ICD-10-CM | POA: Insufficient documentation

## 2021-09-07 DIAGNOSIS — R112 Nausea with vomiting, unspecified: Secondary | ICD-10-CM | POA: Insufficient documentation

## 2021-09-07 DIAGNOSIS — J181 Lobar pneumonia, unspecified organism: Secondary | ICD-10-CM | POA: Insufficient documentation

## 2021-09-07 DIAGNOSIS — Z96651 Presence of right artificial knee joint: Secondary | ICD-10-CM | POA: Insufficient documentation

## 2021-09-07 DIAGNOSIS — J449 Chronic obstructive pulmonary disease, unspecified: Secondary | ICD-10-CM | POA: Insufficient documentation

## 2021-09-07 DIAGNOSIS — Z79899 Other long term (current) drug therapy: Secondary | ICD-10-CM | POA: Insufficient documentation

## 2021-09-07 DIAGNOSIS — J189 Pneumonia, unspecified organism: Secondary | ICD-10-CM

## 2021-09-07 LAB — COMPREHENSIVE METABOLIC PANEL
ALT: 14 U/L (ref 0–44)
AST: 33 U/L (ref 15–41)
Albumin: 4 g/dL (ref 3.5–5.0)
Alkaline Phosphatase: 49 U/L (ref 38–126)
Anion gap: 15 (ref 5–15)
BUN: 9 mg/dL (ref 8–23)
CO2: 25 mmol/L (ref 22–32)
Calcium: 9.2 mg/dL (ref 8.9–10.3)
Chloride: 90 mmol/L — ABNORMAL LOW (ref 98–111)
Creatinine, Ser: 0.61 mg/dL (ref 0.44–1.00)
GFR, Estimated: >60 mL/min (ref >60)
Glucose, Bld: 151 mg/dL — ABNORMAL HIGH (ref 70–99)
Potassium: 3.8 mmol/L (ref 3.5–5.1)
Sodium: 130 mmol/L — ABNORMAL LOW (ref 135–145)
Total Bilirubin: 1.1 mg/dL (ref 0.3–1.2)
Total Protein: 7.2 g/dL (ref 6.5–8.1)

## 2021-09-07 LAB — CBC
HCT: 40.3 % (ref 36.0–46.0)
Hemoglobin: 15 g/dL (ref 12.0–15.0)
MCH: 35.8 pg — ABNORMAL HIGH (ref 26.0–34.0)
MCHC: 37.2 g/dL — ABNORMAL HIGH (ref 30.0–36.0)
MCV: 96.2 fL (ref 80.0–100.0)
Platelets: 186 10*3/uL (ref 150–400)
RBC: 4.19 MIL/uL (ref 3.87–5.11)
RDW: 12.6 % (ref 11.5–15.5)
WBC: 7.3 10*3/uL (ref 4.0–10.5)
nRBC: 0 % (ref 0.0–0.2)

## 2021-09-07 LAB — RESP PANEL BY RT-PCR (FLU A&B, COVID) ARPGX2
Influenza A by PCR: NEGATIVE
Influenza B by PCR: NEGATIVE
SARS Coronavirus 2 by RT PCR: NEGATIVE

## 2021-09-07 LAB — TROPONIN I (HIGH SENSITIVITY): Troponin I (High Sensitivity): 10 ng/L (ref <18)

## 2021-09-07 LAB — LIPASE, BLOOD: Lipase: 30 U/L (ref 11–51)

## 2021-09-07 MED ORDER — ACETAMINOPHEN 500 MG PO TABS
ORAL_TABLET | ORAL | Status: AC
  Filled 2021-09-07: qty 2

## 2021-09-07 MED ORDER — ACETAMINOPHEN 500 MG PO TABS
1000.0000 mg | ORAL_TABLET | Freq: Once | ORAL | Status: AC
  Administered 2021-09-07: 1000 mg via ORAL

## 2021-09-07 MED ORDER — ONDANSETRON HCL 4 MG/2ML IJ SOLN
4.0000 mg | Freq: Once | INTRAMUSCULAR | Status: AC
  Administered 2021-09-07: 4 mg via INTRAVENOUS
  Filled 2021-09-07: qty 2

## 2021-09-07 MED ORDER — IOHEXOL 350 MG/ML SOLN
80.0000 mL | Freq: Once | INTRAVENOUS | Status: AC | PRN
  Administered 2021-09-07: 80 mL via INTRAVENOUS

## 2021-09-07 MED ORDER — CEFPODOXIME PROXETIL 200 MG PO TABS: 200.0000 mg | ORAL_TABLET | Freq: Two times a day (BID) | ORAL | 0 refills | Status: DC

## 2021-09-07 MED ORDER — AZITHROMYCIN 250 MG PO TABS: ORAL_TABLET | ORAL | 0 refills | Status: DC

## 2021-09-07 MED ORDER — ONDANSETRON 4 MG PO TBDP: 4.0000 mg | ORAL_TABLET | Freq: Three times a day (TID) | ORAL | 0 refills | Status: DC | PRN

## 2021-09-07 MED ORDER — LACTATED RINGERS IV BOLUS
500.0000 mL | Freq: Once | INTRAVENOUS | Status: AC
  Administered 2021-09-07: 500 mL via INTRAVENOUS

## 2021-09-07 NOTE — ED Notes (Signed)
Pt to ED with son c/o was hypertensive this morning 212/134 with severe HA that started several days ago and has worsened. Pt c/o nausea with continuous dry heaves. Pt is currently resting comfortably in bed.

## 2021-09-07 NOTE — ED Notes (Signed)
Paper discharge consent signed. 

## 2021-09-07 NOTE — ED Notes (Signed)
Pt to CT

## 2021-09-07 NOTE — ED Notes (Signed)
First Rn note:  Pt comes into the ED via [facility] from home c/o headache, nausea and vomiting.  Pt states the headache is on the right side with dizziness.  Pt is orthostatic stable.  188/107 95% RA 71 HR 12 lea unremarkable

## 2021-09-07 NOTE — ED Triage Notes (Signed)
Pt to ED for headache, nausea, vomiting that started Monday. LWBS yesterday. Denies hx of headache or migraines. Denies recent falls or head injuries  [facility] [place]

## 2021-09-07 NOTE — ED Notes (Signed)
Dr Larinda Buttery talking with pt and son. Pt provided graham crackers and water for PO challenge.

## 2021-09-07 NOTE — ED Provider Notes (Signed)
[facility] Emergency Department Provider Note   ____________________________________________   Event Date/Time   First MD Initiated Contact with Patient 09/07/21 0945     (approximate)  I have reviewed the triage vital signs and the nursing notes.   HISTORY  Chief Complaint Nausea    HPI Briana Lozano is a 87 y.o. female with past medical history of hypertension, GERD, COPD, and arthritis who presents to the ED complaining of headache and nausea.  Patient reports that she developed nausea along with headache yesterday morning, symptoms have been gradually worsening since then.  She describes the headache as a constant throbbing that affects the entirety of the right side of her head, exacerbated by bright lights.  Since onset of headache, she has been feeling nauseous with multiple episodes of vomiting, denies any diarrhea.  She does endorse a cough with some mild difficulty breathing, denies any fevers or chest pain.  She additionally endorses some discomfort in her upper abdomen going on since she started vomiting.  She denies any associated dysuria, hematuria, or flank pain.  Son is at bedside and denies any recent sick contacts.        Past Medical History:  Diagnosis Date   Arthritis    fingers   Brain aneurysm    COPD (chronic obstructive pulmonary disease) (HCC)    wheezing occasionally   COVID-19 12/19/2019   mild symptoms   Depression    Dyspnea    with exertion   GERD (gastroesophageal reflux disease)    Headache    Heart murmur    followed by PCP   History of hiatal hernia    History of phlebitis    Hypertension    Hypothyroidism    Osteoporosis    Pneumonia    in PAST   Sleep apnea    no CPAP   Vertigo    in past   Wears dentures    full upper    Patient Active Problem List   Diagnosis Date Noted   Abdominal pain 01/28/2019   Acquired hypothyroidism 01/17/2018   Benign essential hypertension 01/17/2018    Intermediate coronary syndrome (HCC) 01/17/2018   Macular degeneration 01/17/2018   Senile osteoporosis 01/17/2018   Obstructive sleep apnea syndrome 11/10/2015   Bowel habit changes 06/16/2014   BRBPR (bright red blood per rectum) 06/16/2014   Anxiety 05/29/2014    Past Surgical History:  Procedure Laterality Date   APPENDECTOMY     BREAST BIOPSY     CATARACT EXTRACTION W/PHACO Left 11/18/2019   Procedure: CATARACT EXTRACTION PHACO AND INTRAOCULAR LENS PLACEMENT (IOC) LEFT 5.77, 00:41.2;  Surgeon: Galen Manila, MD;  Location: [facility];  Service: Ophthalmology;  Laterality: Left;  sleep apnea   CATARACT EXTRACTION W/PHACO Right 01/20/2020   Procedure: CATARACT EXTRACTION PHACO AND INTRAOCULAR LENS PLACEMENT (IOC) RIGHT;  Surgeon: Galen Manila, MD;  Location: [facility];  Service: Ophthalmology;  Laterality: Right;  Lot #[id number] H [place]: 07:12 CDE: 00:59.3    COLONOSCOPY  2002,2008,2015   EYE SURGERY     HEMORRHOID SURGERY  1950   IR ANGIO INTRA EXTRACRAN SEL COM CAROTID INNOMINATE BILAT MOD SED  04/17/2017   IR ANGIO VERTEBRAL SEL VERTEBRAL UNI L MOD SED  04/17/2017   REPLACEMENT TOTAL KNEE Right 04/2005    Prior to Admission medications   Medication Sig Start Date End Date Taking? Authorizing Provider  acetaminophen (TYLENOL) 650 MG CR tablet Take 1,300 mg by mouth every 8 (eight) hours as needed for pain.  Yes [provider]  azithromycin (ZITHROMAX Z-PAK) 250 MG tablet Take 2 tablets (500 mg) on  Day 1,  followed by 1 tablet (250 mg) once daily on Days 2 through 5. 09/07/21 09/12/21 Yes Chesley Noon, MD  bisoprolol-hydrochlorothiazide [facility]) 5-6.25 MG tablet Take 0.5 tablets by mouth every evening.    Yes [provider]  cefpodoxime (VANTIN) 200 MG tablet Take 1 tablet (200 mg total) by mouth 2 (two) times daily for 7 days. 09/07/21 09/14/21 Yes Chesley Noon, MD  citalopram (CELEXA) 20 MG tablet Take 20 mg by mouth daily.    Yes [provider]  cyanocobalamin (,VITAMIN B-12,) 1000 MCG/ML injection Inject 1,000 mcg into the muscle every 30 (thirty) days.   Yes [provider]  diphenhydrAMINE-APAP, sleep, (GOODYS PM PO) Take 1 packet by mouth at bedtime.   Yes [provider]  guaiFENesin (MUCINEX) 600 MG 12 hr tablet Take 600 mg by mouth 2 (two) times daily as needed for cough or to loosen phlegm.   Yes [provider]  levothyroxine (SYNTHROID) 88 MCG tablet Take 1 tablet by mouth daily. 12/29/20  Yes [provider]  loratadine (CLARITIN) 10 MG tablet Take 10 mg by mouth daily.   Yes [provider]  losartan (COZAAR) 50 MG tablet Take 50 mg by mouth daily. am   Yes [provider]  Multiple Vitamin (MULTIVITAMIN WITH MINERALS) TABS tablet Take 1 tablet by mouth daily.   Yes [provider]  Multiple Vitamins-Minerals (PRESERVISION AREDS 2 PO) Take 1 capsule by mouth 2 (two) times daily.   Yes [provider]  ondansetron (ZOFRAN ODT) 4 MG disintegrating tablet Take 1 tablet (4 mg total) by mouth every 8 (eight) hours as needed for nausea or vomiting. 09/07/21  Yes Chesley Noon, MD  pantoprazole (PROTONIX) 40 MG tablet Take 1 tablet by mouth daily. 07/04/21  Yes [provider]  Polyethyl Glycol-Propyl Glycol (SYSTANE OP) Place 1 drop into both eyes daily as needed (dry eyes).    Yes [provider]  polyethylene glycol (MIRALAX / GLYCOLAX) packet Take 17 g by mouth daily as needed for mild constipation.    Yes [provider]  Simethicone (PHAZYME MAXIMUM STRENGTH) 250 MG CAPS Take 250-500 mg by mouth daily as needed (gas).   Yes [provider]  aspirin EC 81 MG tablet Take 81 mg by mouth daily. Patient not taking: No sig reported    [provider]  famotidine (PEPCID) 20 MG tablet Take 20 mg by mouth 2 (two) times daily. Patient not taking: No sig reported    [provider]  levothyroxine  (SYNTHROID) 75 MCG tablet Take 75 mcg by mouth daily before breakfast.    [provider]    Allergies Cortisone, Penicillins, and Valium [diazepam]  Family History  Problem Relation Age of Onset   Stroke Mother    Lung cancer Father    Lung cancer Brother    Breast cancer Neg Hx     Social History Social History   Tobacco Use   Smoking status: Never   Smokeless tobacco: Never  Vaping Use   Vaping Use: Never used  Substance Use Topics   Alcohol use: No   Drug use: No    Review of Systems  Constitutional: No fever/chills Eyes: No visual changes. ENT: No sore throat. Cardiovascular: Denies chest pain. Respiratory: Positive for cough and shortness of breath. Gastrointestinal: Positive for abdominal pain, nausea, and vomiting.  No diarrhea.  No  constipation. Genitourinary: Negative for dysuria. Musculoskeletal: Negative for back pain. Skin: Negative for rash. Neurological: Positive for headache, negative for focal weakness or numbness.  ____________________________________________   PHYSICAL EXAM:  VITAL SIGNS: ED Triage Vitals  Enc Vitals Group     BP 09/07/21 0757 (!) 172/93     Pulse Rate 09/07/21 0757 74     Resp 09/07/21 0757 16     Temp 09/07/21 0757 98 F (36.7 C)     Temp Source 09/07/21 0757 Oral     SpO2 09/07/21 0757 95 %     Weight 09/07/21 0759 121 lb 4.1 oz (55 kg)     Height 09/07/21 0759 5\' 1"  (1.549 m)     Head Circumference --      Peak Flow --      Pain Score 09/07/21 0759 5     Pain Loc --      Pain Edu? --      Excl. in GC? --     Constitutional: Alert and oriented. Eyes: Conjunctivae are normal.  Right eye deviated inward, chronic per patient.  Left pupil reactive to light. Head: Atraumatic. Nose: No congestion/rhinnorhea. Mouth/Throat: Mucous membranes are moist. Neck: Normal ROM Cardiovascular: Normal rate, regular rhythm. Grossly normal heart sounds.  2+ radial pulses bilaterally. Respiratory: Normal respiratory  effort.  No retractions. Lungs CTAB. Gastrointestinal: Soft and tender to palpation in the bilateral lower quadrants with no rebound or guarding. No distention. Genitourinary: deferred Musculoskeletal: No lower extremity tenderness nor edema. Neurologic:  Normal speech and language. No gross focal neurologic deficits are appreciated. Skin:  Skin is warm, dry and intact. No rash noted. Psychiatric: Mood and affect are normal. Speech and behavior are normal.  ____________________________________________   LABS (all labs ordered are listed, but only abnormal results are displayed)  Labs Reviewed  CBC - Abnormal; Notable for the following components:      Result Value   MCH 35.8 (*)    MCHC 37.2 (*)    All other components within normal limits  COMPREHENSIVE METABOLIC PANEL - Abnormal; Notable for the following components:   Sodium 130 (*)    Chloride 90 (*)    Glucose, Bld 151 (*)    All other components within normal limits  RESP PANEL BY RT-PCR (FLU A&B, COVID) ARPGX2  LIPASE, BLOOD  TROPONIN I (HIGH SENSITIVITY)   ____________________________________________  EKG  ED ECG REPORT I, 09/09/21, the attending physician, personally viewed and interpreted this ECG.   Date: 09/07/2021  EKG Time: 10:32  Rate: 72  Rhythm: normal sinus rhythm  Axis: Normal  Intervals:none  ST&T Change: None   PROCEDURES  Procedure(s) performed (including Critical Care):  Procedures   ____________________________________________   INITIAL IMPRESSION / ASSESSMENT AND PLAN / ED COURSE      87 year old female with past medical history of hypertension, GERD, and arthritis who presents to the ED complaining of about 24 hours of gradually worsening headache, nausea, vomiting, abdominal pain, cough, and mild shortness of breath.  Patient is overall well-appearing with reassuring vital signs, no focal neurologic deficits noted on exam.  We will check CT head as well as CT abdomen/pelvis  given her tenderness.  Urine sample was obtained last night and showed no signs of infection, we will also check chest x-ray.  Labs thus far are reassuring, LFTs and lipase within normal limits.  Patient reports nausea is improved following dose of Zofran in triage.  I would also consider viral etiology and testing for COVID-19 and  influenza is pending.  CT head is negative for acute process, CT abdomen/pelvis shows large hiatal hernia but otherwise unremarkable.  Chest x-ray reviewed by me and does show questionable developing right-sided infiltrate.  Patient has been able to tolerate water and crackers without difficulty, states she feels much better.  Testing for COVID is negative.  She is appropriate for discharge home with close PCP follow-up, we will prescribe antibiotics for possible early pneumonia along with Zofran.  Son was counseled to have patient return to the ED for new worsening symptoms, patient and son agree with plan.      ____________________________________________   FINAL CLINICAL IMPRESSION(S) / ED DIAGNOSES  Final diagnoses:  Community acquired pneumonia of right lung, unspecified part of lung  Non-intractable vomiting with nausea, unspecified vomiting type     ED Discharge Orders          Ordered    cefpodoxime (VANTIN) 200 MG tablet  2 times daily        09/07/21 1216    azithromycin (ZITHROMAX Z-PAK) 250 MG tablet        09/07/21 1216    ondansetron (ZOFRAN ODT) 4 MG disintegrating tablet  Every 8 hours PRN        09/07/21 1216             Note:  This document was prepared using Dragon voice recognition software and may include unintentional dictation errors.    Chesley Noon, MD 09/07/21 [phone]

## 2021-09-07 NOTE — ED Provider Notes (Signed)
Emergency Medicine Provider Triage Evaluation Note  Briana Lozano , a 87 y.o. female  was evaluated in triage.  Pt complains of sudden onset of severe headache with nausea vomiting started on Monday.  Left without being seen yesterday..  Review of Systems  Positive: Headache, nausea/vomiting Negative: Chest pain, shortness of breath, fever or chills  Physical Exam  BP (!) 172/93   Pulse 74   Temp 98 F (36.7 C) (Oral)   Resp 16   Ht 5\' 1"  (1.549 m)   Wt 55 kg   SpO2 95%   BMI 22.91 kg/m  Gen:   Awake, no distress  Resp:  Normal effort  MSK:   Moves extremities without difficulty  Other:    Medical Decision Making  Medically screening exam initiated at 8:02 AM.  Appropriate orders placed.  Darl Jrue Yambao was informed that the remainder of the evaluation will be completed by another provider, this initial triage assessment does not replace that evaluation, and the importance of remaining in the ED until their evaluation is complete.  No strokelike symptoms   Dierdre Searles, PA-C 09/07/21 09/09/21    0102, MD 09/07/21 [phone]

## 2021-09-09 ENCOUNTER — Encounter: Payer: Self-pay | Admitting: Emergency Medicine

## 2021-09-09 ENCOUNTER — Emergency Department: Payer: Medicare Other

## 2021-09-09 ENCOUNTER — Inpatient Hospital Stay
Admission: EM | Admit: 2021-09-09 | Discharge: 2021-09-14 | DRG: 640 | Disposition: A | Discharge status: Skilled Nursing Facility | Payer: Medicare Other | Attending: Internal Medicine | Admitting: Internal Medicine

## 2021-09-09 ENCOUNTER — Other Ambulatory Visit: Payer: Self-pay

## 2021-09-09 DIAGNOSIS — Z801 Family history of malignant neoplasm of trachea, bronchus and lung: Secondary | ICD-10-CM

## 2021-09-09 DIAGNOSIS — Z20822 Contact with and (suspected) exposure to covid-19: Secondary | ICD-10-CM | POA: Diagnosis present

## 2021-09-09 DIAGNOSIS — K449 Diaphragmatic hernia without obstruction or gangrene: Secondary | ICD-10-CM | POA: Diagnosis not present

## 2021-09-09 DIAGNOSIS — I671 Cerebral aneurysm, nonruptured: Secondary | ICD-10-CM | POA: Diagnosis present

## 2021-09-09 DIAGNOSIS — I10 Essential (primary) hypertension: Secondary | ICD-10-CM

## 2021-09-09 DIAGNOSIS — M810 Age-related osteoporosis without current pathological fracture: Secondary | ICD-10-CM | POA: Diagnosis present

## 2021-09-09 DIAGNOSIS — G9341 Metabolic encephalopathy: Secondary | ICD-10-CM | POA: Diagnosis present

## 2021-09-09 DIAGNOSIS — G4730 Sleep apnea, unspecified: Secondary | ICD-10-CM | POA: Diagnosis present

## 2021-09-09 DIAGNOSIS — R202 Paresthesia of skin: Secondary | ICD-10-CM | POA: Diagnosis not present

## 2021-09-09 DIAGNOSIS — H52511 Internal ophthalmoplegia (complete) (total), right eye: Secondary | ICD-10-CM | POA: Diagnosis present

## 2021-09-09 DIAGNOSIS — R200 Anesthesia of skin: Secondary | ICD-10-CM | POA: Diagnosis not present

## 2021-09-09 DIAGNOSIS — R54 Age-related physical debility: Secondary | ICD-10-CM | POA: Diagnosis present

## 2021-09-09 DIAGNOSIS — Z961 Presence of intraocular lens: Secondary | ICD-10-CM | POA: Diagnosis present

## 2021-09-09 DIAGNOSIS — E876 Hypokalemia: Secondary | ICD-10-CM | POA: Diagnosis present

## 2021-09-09 DIAGNOSIS — Z823 Family history of stroke: Secondary | ICD-10-CM | POA: Diagnosis not present

## 2021-09-09 DIAGNOSIS — E871 Hypo-osmolality and hyponatremia: Principal | ICD-10-CM | POA: Diagnosis present

## 2021-09-09 DIAGNOSIS — Y92009 Unspecified place in unspecified non-institutional (private) residence as the place of occurrence of the external cause: Secondary | ICD-10-CM

## 2021-09-09 DIAGNOSIS — E039 Hypothyroidism, unspecified: Secondary | ICD-10-CM

## 2021-09-09 DIAGNOSIS — Z79890 Hormone replacement therapy (postmenopausal): Secondary | ICD-10-CM

## 2021-09-09 DIAGNOSIS — T502X5A Adverse effect of carbonic-anhydrase inhibitors, benzothiadiazides and other diuretics, initial encounter: Secondary | ICD-10-CM | POA: Diagnosis present

## 2021-09-09 DIAGNOSIS — Z8672 Personal history of thrombophlebitis: Secondary | ICD-10-CM | POA: Diagnosis not present

## 2021-09-09 DIAGNOSIS — Z7982 Long term (current) use of aspirin: Secondary | ICD-10-CM

## 2021-09-09 DIAGNOSIS — R55 Syncope and collapse: Secondary | ICD-10-CM

## 2021-09-09 DIAGNOSIS — H3530 Unspecified macular degeneration: Secondary | ICD-10-CM | POA: Diagnosis present

## 2021-09-09 DIAGNOSIS — E861 Hypovolemia: Secondary | ICD-10-CM | POA: Diagnosis present

## 2021-09-09 DIAGNOSIS — K5900 Constipation, unspecified: Secondary | ICD-10-CM | POA: Diagnosis not present

## 2021-09-09 DIAGNOSIS — I951 Orthostatic hypotension: Secondary | ICD-10-CM

## 2021-09-09 DIAGNOSIS — J449 Chronic obstructive pulmonary disease, unspecified: Secondary | ICD-10-CM | POA: Diagnosis present

## 2021-09-09 DIAGNOSIS — I729 Aneurysm of unspecified site: Secondary | ICD-10-CM | POA: Diagnosis not present

## 2021-09-09 DIAGNOSIS — R519 Headache, unspecified: Secondary | ICD-10-CM | POA: Diagnosis present

## 2021-09-09 DIAGNOSIS — R4182 Altered mental status, unspecified: Secondary | ICD-10-CM | POA: Diagnosis present

## 2021-09-09 DIAGNOSIS — G4459 Other complicated headache syndrome: Secondary | ICD-10-CM | POA: Diagnosis not present

## 2021-09-09 DIAGNOSIS — H5711 Ocular pain, right eye: Secondary | ICD-10-CM | POA: Diagnosis present

## 2021-09-09 DIAGNOSIS — F32A Depression, unspecified: Secondary | ICD-10-CM | POA: Diagnosis present

## 2021-09-09 DIAGNOSIS — Z888 Allergy status to other drugs, medicaments and biological substances status: Secondary | ICD-10-CM

## 2021-09-09 DIAGNOSIS — Z79899 Other long term (current) drug therapy: Secondary | ICD-10-CM

## 2021-09-09 DIAGNOSIS — R531 Weakness: Secondary | ICD-10-CM | POA: Diagnosis not present

## 2021-09-09 DIAGNOSIS — W19XXXA Unspecified fall, initial encounter: Secondary | ICD-10-CM | POA: Diagnosis present

## 2021-09-09 DIAGNOSIS — Z96651 Presence of right artificial knee joint: Secondary | ICD-10-CM | POA: Diagnosis present

## 2021-09-09 DIAGNOSIS — K219 Gastro-esophageal reflux disease without esophagitis: Secondary | ICD-10-CM

## 2021-09-09 DIAGNOSIS — Z8616 Personal history of COVID-19: Secondary | ICD-10-CM | POA: Diagnosis not present

## 2021-09-09 DIAGNOSIS — Z9842 Cataract extraction status, left eye: Secondary | ICD-10-CM

## 2021-09-09 DIAGNOSIS — Z9841 Cataract extraction status, right eye: Secondary | ICD-10-CM

## 2021-09-09 DIAGNOSIS — R4189 Other symptoms and signs involving cognitive functions and awareness: Secondary | ICD-10-CM

## 2021-09-09 DIAGNOSIS — Z880 Allergy status to penicillin: Secondary | ICD-10-CM

## 2021-09-09 LAB — TSH: TSH: 2.114 u[IU]/mL (ref 0.350–4.500)

## 2021-09-09 LAB — COMPREHENSIVE METABOLIC PANEL
ALT: 23 U/L (ref 0–44)
AST: 47 U/L — ABNORMAL HIGH (ref 15–41)
Albumin: 3.4 g/dL — ABNORMAL LOW (ref 3.5–5.0)
Alkaline Phosphatase: 36 U/L — ABNORMAL LOW (ref 38–126)
Anion gap: 7 (ref 5–15)
BUN: 14 mg/dL (ref 8–23)
CO2: 29 mmol/L (ref 22–32)
Calcium: 8.4 mg/dL — ABNORMAL LOW (ref 8.9–10.3)
Chloride: 85 mmol/L — ABNORMAL LOW (ref 98–111)
Creatinine, Ser: 0.63 mg/dL (ref 0.44–1.00)
GFR, Estimated: >60 mL/min (ref >60)
Glucose, Bld: 107 mg/dL — ABNORMAL HIGH (ref 70–99)
Potassium: 3 mmol/L — ABNORMAL LOW (ref 3.5–5.1)
Sodium: 121 mmol/L — ABNORMAL LOW (ref 135–145)
Total Bilirubin: 1.1 mg/dL (ref 0.3–1.2)
Total Protein: 6.2 g/dL — ABNORMAL LOW (ref 6.5–8.1)

## 2021-09-09 LAB — CBC WITH DIFFERENTIAL/PLATELET
Abs Immature Granulocytes: 0.03 10*3/uL (ref 0.00–0.07)
Basophils Absolute: 0 10*3/uL (ref 0.0–0.1)
Basophils Relative: 0 %
Eosinophils Absolute: 0 10*3/uL (ref 0.0–0.5)
Eosinophils Relative: 0 %
Hemoglobin: 14.1 g/dL (ref 12.0–15.0)
Immature Granulocytes: 0 %
Lymphocytes Relative: 18 %
Lymphs Abs: 1.8 10*3/uL (ref 0.7–4.0)
Monocytes Absolute: 0.9 10*3/uL (ref 0.1–1.0)
Monocytes Relative: 9 %
Neutro Abs: 7.3 10*3/uL (ref 1.7–7.7)
Neutrophils Relative %: 73 %
Platelets: 201 10*3/uL (ref 150–400)
WBC: 10 10*3/uL (ref 4.0–10.5)

## 2021-09-09 LAB — RESP PANEL BY RT-PCR (FLU A&B, COVID) ARPGX2
Influenza A by PCR: NEGATIVE
Influenza B by PCR: NEGATIVE
SARS Coronavirus 2 by RT PCR: NEGATIVE

## 2021-09-09 LAB — URINE DRUG SCREEN, QUALITATIVE (ARMC ONLY)
Amphetamines, Ur Screen: NOT DETECTED
Barbiturates, Ur Screen: NOT DETECTED
Benzodiazepine, Ur Scrn: NOT DETECTED
Cannabinoid 50 Ng, Ur ~~LOC~~: NOT DETECTED
Cocaine Metabolite,Ur ~~LOC~~: NOT DETECTED
MDMA (Ecstasy)Ur Screen: NOT DETECTED
Methadone Scn, Ur: NOT DETECTED
Opiate, Ur Screen: NOT DETECTED
Phencyclidine (PCP) Ur S: NOT DETECTED
Tricyclic, Ur Screen: POSITIVE — AB

## 2021-09-09 LAB — MAGNESIUM: Magnesium: 1.7 mg/dL (ref 1.7–2.4)

## 2021-09-09 LAB — URINALYSIS, COMPLETE (UACMP) WITH MICROSCOPIC
Bacteria, UA: NONE SEEN
Bilirubin Urine: NEGATIVE
Glucose, UA: NEGATIVE mg/dL
Hgb urine dipstick: NEGATIVE
Leukocytes,Ua: NEGATIVE
Nitrite: NEGATIVE
Protein, ur: NEGATIVE mg/dL
Specific Gravity, Urine: 1.015 (ref 1.005–1.030)
pH: 6.5 (ref 5.0–8.0)

## 2021-09-09 LAB — ETHANOL: Alcohol, Ethyl (B): <10 mg/dL (ref <10)

## 2021-09-09 LAB — BASIC METABOLIC PANEL
Anion gap: 6 (ref 5–15)
BUN: 10 mg/dL (ref 8–23)
CO2: 26 mmol/L (ref 22–32)
Calcium: 8.5 mg/dL — ABNORMAL LOW (ref 8.9–10.3)
Chloride: 93 mmol/L — ABNORMAL LOW (ref 98–111)
Creatinine, Ser: 0.74 mg/dL (ref 0.44–1.00)
GFR, Estimated: >60 mL/min (ref >60)
Glucose, Bld: 104 mg/dL — ABNORMAL HIGH (ref 70–99)
Potassium: 3.7 mmol/L (ref 3.5–5.1)
Sodium: 125 mmol/L — ABNORMAL LOW (ref 135–145)

## 2021-09-09 LAB — LACTIC ACID, PLASMA: Lactic Acid, Venous: 1.6 mmol/L (ref 0.5–1.9)

## 2021-09-09 LAB — PROCALCITONIN: Procalcitonin: <0.1 ng/mL

## 2021-09-09 LAB — OSMOLALITY: Osmolality: 259 mOsm/kg — ABNORMAL LOW (ref 275–295)

## 2021-09-09 LAB — SODIUM, URINE, RANDOM: Sodium, Ur: 53 mmol/L

## 2021-09-09 LAB — OSMOLALITY, URINE: Osmolality, Ur: 428 mOsm/kg (ref 300–900)

## 2021-09-09 LAB — AMMONIA: Ammonia: 10 umol/L (ref 9–35)

## 2021-09-09 LAB — TROPONIN I (HIGH SENSITIVITY): Troponin I (High Sensitivity): 12 ng/L (ref <18)

## 2021-09-09 MED ORDER — ACETAMINOPHEN 500 MG PO TABS
1000.0000 mg | ORAL_TABLET | Freq: Three times a day (TID) | ORAL | Status: DC | PRN
  Administered 2021-09-09 – 2021-09-14 (×12): 1000 mg via ORAL
  Filled 2021-09-09 (×13): qty 2

## 2021-09-09 MED ORDER — ONDANSETRON HCL 4 MG/2ML IJ SOLN
INTRAMUSCULAR | Status: AC
  Administered 2021-09-09: 4 mg via INTRAVENOUS
  Filled 2021-09-09: qty 2

## 2021-09-09 MED ORDER — POTASSIUM CHLORIDE IN NACL 40-0.9 MEQ/L-% IV SOLN
INTRAVENOUS | Status: DC
  Filled 2021-09-09 (×2): qty 1000

## 2021-09-09 MED ORDER — LABETALOL HCL 5 MG/ML IV SOLN: 10.0000 mg | INTRAVENOUS | Status: DC | PRN

## 2021-09-09 MED ORDER — PANTOPRAZOLE SODIUM 40 MG PO TBEC
40.0000 mg | DELAYED_RELEASE_TABLET | Freq: Every day | ORAL | Status: DC
  Administered 2021-09-09 – 2021-09-14 (×6): 40 mg via ORAL
  Filled 2021-09-09 (×7): qty 1

## 2021-09-09 MED ORDER — POLYETHYL GLYCOL-PROPYL GLYCOL 0.4-0.3 % OP GEL: Freq: Every day | OPHTHALMIC | Status: DC | PRN

## 2021-09-09 MED ORDER — POLYETHYLENE GLYCOL 3350 17 G PO PACK
17.0000 g | PACK | Freq: Every day | ORAL | Status: DC | PRN
  Administered 2021-09-10 – 2021-09-14 (×5): 17 g via ORAL
  Filled 2021-09-09 (×5): qty 1

## 2021-09-09 MED ORDER — IOHEXOL 350 MG/ML SOLN
60.0000 mL | Freq: Once | INTRAVENOUS | Status: AC | PRN
  Administered 2021-09-09: 60 mL via INTRAVENOUS

## 2021-09-09 MED ORDER — POTASSIUM CHLORIDE CRYS ER 20 MEQ PO TBCR
40.0000 meq | EXTENDED_RELEASE_TABLET | Freq: Once | ORAL | Status: AC
  Administered 2021-09-09: 40 meq via ORAL
  Filled 2021-09-09: qty 2

## 2021-09-09 MED ORDER — LEVOTHYROXINE SODIUM 50 MCG PO TABS
75.0000 ug | ORAL_TABLET | Freq: Every day | ORAL | Status: DC
  Administered 2021-09-10 – 2021-09-14 (×5): 75 ug via ORAL
  Filled 2021-09-09 (×3): qty 1
  Filled 2021-09-09: qty 2
  Filled 2021-09-09: qty 1

## 2021-09-09 MED ORDER — SIMETHICONE 80 MG PO CHEW
80.0000 mg | CHEWABLE_TABLET | Freq: Every day | ORAL | Status: DC | PRN
  Filled 2021-09-09: qty 1

## 2021-09-09 MED ORDER — ACETAMINOPHEN 500 MG PO TABS
1000.0000 mg | ORAL_TABLET | Freq: Once | ORAL | Status: AC
  Administered 2021-09-09: 1000 mg via ORAL
  Filled 2021-09-09: qty 2

## 2021-09-09 MED ORDER — BISOPROLOL FUMARATE 5 MG PO TABS
2.5000 mg | ORAL_TABLET | Freq: Every day | ORAL | Status: DC
  Administered 2021-09-09: 2.5 mg via ORAL
  Filled 2021-09-09 (×2): qty 0.5

## 2021-09-09 MED ORDER — PRESERVISION AREDS 2 PO CAPS: ORAL_CAPSULE | Freq: Two times a day (BID) | ORAL | Status: DC

## 2021-09-09 MED ORDER — SODIUM CHLORIDE 0.9 % IV BOLUS (SEPSIS)
500.0000 mL | Freq: Once | INTRAVENOUS | Status: AC
  Administered 2021-09-09: 500 mL via INTRAVENOUS

## 2021-09-09 MED ORDER — SODIUM CHLORIDE 0.9 % IV SOLN: INTRAVENOUS | Status: DC

## 2021-09-09 MED ORDER — ONDANSETRON HCL 4 MG/2ML IJ SOLN: 4.0000 mg | INTRAMUSCULAR | Status: AC

## 2021-09-09 MED ORDER — OCUVITE-LUTEIN PO CAPS
1.0000 | ORAL_CAPSULE | Freq: Every day | ORAL | Status: DC
  Administered 2021-09-09 – 2021-09-14 (×6): 1 via ORAL
  Filled 2021-09-09 (×7): qty 1

## 2021-09-09 MED ORDER — LORATADINE 10 MG PO TABS
10.0000 mg | ORAL_TABLET | Freq: Every day | ORAL | Status: DC
  Administered 2021-09-09 – 2021-09-10 (×2): 10 mg via ORAL
  Filled 2021-09-09 (×2): qty 1

## 2021-09-09 MED ORDER — CITALOPRAM HYDROBROMIDE 20 MG PO TABS: 20.0000 mg | ORAL_TABLET | Freq: Every day | ORAL | Status: DC

## 2021-09-09 MED ORDER — ONDANSETRON HCL 4 MG/2ML IJ SOLN: 4.0000 mg | Freq: Four times a day (QID) | INTRAMUSCULAR | Status: DC | PRN

## 2021-09-09 MED ORDER — ONDANSETRON HCL 4 MG PO TABS: 4.0000 mg | ORAL_TABLET | Freq: Four times a day (QID) | ORAL | Status: DC | PRN

## 2021-09-09 MED ORDER — CLEVIDIPINE BUTYRATE 0.5 MG/ML IV EMUL
0.0000 mg/h | INTRAVENOUS | Status: DC
  Administered 2021-09-09: 1 mg/h via INTRAVENOUS
  Administered 2021-09-10: 2 mg/h via INTRAVENOUS
  Filled 2021-09-09 (×2): qty 50

## 2021-09-09 MED ORDER — POLYVINYL ALCOHOL 1.4 % OP SOLN
1.0000 [drp] | OPHTHALMIC | Status: DC | PRN
  Filled 2021-09-09: qty 15

## 2021-09-09 NOTE — ED Provider Notes (Signed)
[facility] Emergency Department Provider Note ____________________________________________   Event Date/Time   First MD Initiated Contact with Patient 09/09/21 [phone]     (approximate)  I have reviewed the triage vital signs and the nursing notes.   HISTORY  Chief Complaint Altered Mental Status    HPI Briana Lozano is a 87 y.o. female with history of intracranial aneurysm, hypertension, COPD, hypothyroidism who lives with her son who comes in with EMS for concerns for a fall and episode of unresponsiveness.  Son reports that he heard a thud and went into the room and found her on the floor.  States she was initially awake.  They were trying to get her up and she had episode of shaking for about 2 to 3 seconds with her tongue sticking out of her mouth and then was unresponsive for 5 minutes.  He states that she never stopped breathing.  She did not turn blue.  They did not have to administer CPR.  He states for the past few days she has been complaining of severe right-sided headaches and vomiting.  She was seen in the emergency department on 09/07/2021 for this.  Had a CT of her head as well as her abdomen pelvis that showed no acute abnormality but chest x-ray was concerning for developing right lower lobe pneumonia.  Discharged on Vantin and azithromycin.  Family denies any previous history of seizures.  She is on aspirin but no anticoagulation.  Son reports for the past couple of days he has noticed that she has not been able to open her right eye but states that she has always had medial deviation of this eye.         Past Medical History:  Diagnosis Date   Arthritis    fingers   Brain aneurysm    COPD (chronic obstructive pulmonary disease) (HCC)    wheezing occasionally   COVID-19 12/19/2019   mild symptoms   Depression    Dyspnea    with exertion   GERD (gastroesophageal reflux disease)    Headache    Heart murmur    followed by PCP    History of hiatal hernia    History of phlebitis    Hypertension    Hypothyroidism    Osteoporosis    Pneumonia    in PAST   Sleep apnea    no CPAP   Vertigo    in past   Wears dentures    full upper    Patient Active Problem List   Diagnosis Date Noted   Abdominal pain 01/28/2019   Acquired hypothyroidism 01/17/2018   Benign essential hypertension 01/17/2018   Intermediate coronary syndrome (HCC) 01/17/2018   Macular degeneration 01/17/2018   Senile osteoporosis 01/17/2018   Obstructive sleep apnea syndrome 11/10/2015   Bowel habit changes 06/16/2014   BRBPR (bright red blood per rectum) 06/16/2014   Anxiety 05/29/2014    Past Surgical History:  Procedure Laterality Date   APPENDECTOMY     BREAST BIOPSY     CATARACT EXTRACTION W/PHACO Left 11/18/2019   Procedure: CATARACT EXTRACTION PHACO AND INTRAOCULAR LENS PLACEMENT (IOC) LEFT 5.77, 00:41.2;  Surgeon: Galen Manila, MD;  Location: [facility];  Service: Ophthalmology;  Laterality: Left;  sleep apnea   CATARACT EXTRACTION W/PHACO Right 01/20/2020   Procedure: CATARACT EXTRACTION PHACO AND INTRAOCULAR LENS PLACEMENT (IOC) RIGHT;  Surgeon: Galen Manila, MD;  Location: [facility];  Service: Ophthalmology;  Laterality: Right;  Lot #[id number] H [place]: 07:12 CDE: 00:59.3  COLONOSCOPY  2002,2008,2015   EYE SURGERY     HEMORRHOID SURGERY  1950   IR ANGIO INTRA EXTRACRAN SEL COM CAROTID INNOMINATE BILAT MOD SED  04/17/2017   IR ANGIO VERTEBRAL SEL VERTEBRAL UNI L MOD SED  04/17/2017   REPLACEMENT TOTAL KNEE Right 04/2005    Prior to Admission medications   Medication Sig Start Date End Date Taking? Authorizing Provider  acetaminophen (TYLENOL) 650 MG CR tablet Take 1,300 mg by mouth every 8 (eight) hours as needed for pain.     [provider]  aspirin EC 81 MG tablet Take 81 mg by mouth daily. Patient not taking: No sig reported    [provider]  azithromycin (ZITHROMAX Z-PAK) 250 MG tablet Take  2 tablets (500 mg) on  Day 1,  followed by 1 tablet (250 mg) once daily on Days 2 through 5. 09/07/21 09/12/21  Chesley Noon, MD  bisoprolol-hydrochlorothiazide [facility]) 5-6.25 MG tablet Take 0.5 tablets by mouth every evening.     [provider]  cefpodoxime (VANTIN) 200 MG tablet Take 1 tablet (200 mg total) by mouth 2 (two) times daily for 7 days. 09/07/21 09/14/21  Chesley Noon, MD  citalopram (CELEXA) 20 MG tablet Take 20 mg by mouth daily.     [provider]  cyanocobalamin (,VITAMIN B-12,) 1000 MCG/ML injection Inject 1,000 mcg into the muscle every 30 (thirty) days.    [provider]  diphenhydrAMINE-APAP, sleep, (GOODYS PM PO) Take 1 packet by mouth at bedtime.    [provider]  famotidine (PEPCID) 20 MG tablet Take 20 mg by mouth 2 (two) times daily. Patient not taking: No sig reported    [provider]  guaiFENesin (MUCINEX) 600 MG 12 hr tablet Take 600 mg by mouth 2 (two) times daily as needed for cough or to loosen phlegm.    [provider]  levothyroxine (SYNTHROID) 75 MCG tablet Take 75 mcg by mouth daily before breakfast.    [provider]  levothyroxine (SYNTHROID) 88 MCG tablet Take 1 tablet by mouth daily. 12/29/20   [provider]  loratadine (CLARITIN) 10 MG tablet Take 10 mg by mouth daily.    [provider]  losartan (COZAAR) 50 MG tablet Take 50 mg by mouth daily. am    [provider]  Multiple Vitamin (MULTIVITAMIN WITH MINERALS) TABS tablet Take 1 tablet by mouth daily.    [provider]  Multiple Vitamins-Minerals (PRESERVISION AREDS 2 PO) Take 1 capsule by mouth 2 (two) times daily.    [provider]  ondansetron (ZOFRAN ODT) 4 MG disintegrating tablet Take 1 tablet (4 mg total) by mouth every 8 (eight) hours as needed for nausea or vomiting. 09/07/21   Chesley Noon, MD  pantoprazole (PROTONIX) 40 MG tablet Take 1 tablet by mouth daily. 07/04/21    [provider]  Polyethyl Glycol-Propyl Glycol (SYSTANE OP) Place 1 drop into both eyes daily as needed (dry eyes).     [provider]  polyethylene glycol (MIRALAX / GLYCOLAX) packet Take 17 g by mouth daily as needed for mild constipation.     [provider]  Simethicone (PHAZYME MAXIMUM STRENGTH) 250 MG CAPS Take 250-500 mg by mouth daily as needed (gas).    [provider]    Allergies Cortisone, Penicillins, and Valium [diazepam]  Family History  Problem Relation Age of Onset   Stroke Mother    Lung cancer Father    Lung cancer Brother    Breast  cancer Neg Hx     Social History Social History   Tobacco Use   Smoking status: Never   Smokeless tobacco: Never  Vaping Use   Vaping Use: Never used  Substance Use Topics   Alcohol use: No   Drug use: No    Review of Systems Level 5 caveat secondary to confusion   ____________________________________________   PHYSICAL EXAM:  VITAL SIGNS: ED Triage Vitals  Enc Vitals Group     BP 09/09/21 0210 (!) 179/77     Pulse Rate 09/09/21 0210 70     Resp 09/09/21 0210 11     Temp 09/09/21 0207 98.2 F (36.8 C)     Temp Source 09/09/21 0207 Rectal     SpO2 09/09/21 0210 99 %     Weight 09/09/21 0208 121 lb 4.1 oz (55 kg)     Height 09/09/21 0208 5\' 1"  (1.549 m)     Head Circumference --      Peak Flow --      Pain Score --      Pain Loc --      Pain Edu? --      Excl. in GC? --    CONSTITUTIONAL: Alert and oriented to person only.  Elderly.  In no distress. HEAD: Normocephalic; atraumatic EYES: Conjunctivae clear, PERRL, patient has medial deviation of the right eye, extraocular movements of the left eye appear normal ENT: normal nose; no rhinorrhea; moist mucous membranes; pharynx without lesions noted; no dental injury; no septal hematoma NECK: Supple, no meningismus, no LAD; no midline spinal tenderness, step-off or deformity; trachea midline CARD: RRR; S1 and S2 appreciated;  no murmurs, no clicks, no rubs, no gallops RESP: Normal chest excursion without splinting or tachypnea; breath sounds clear and equal bilaterally; no wheezes, no rhonchi, no rales; no hypoxia or respiratory distress CHEST:  chest wall stable, no crepitus or ecchymosis or deformity, nontender to palpation; no flail chest ABD/GI: Normal bowel sounds; non-distended; soft, non-tender, no rebound, no guarding; no ecchymosis or other lesions noted PELVIS:  stable, nontender to palpation BACK:  The back appears normal and is non-tender to palpation, there is no CVA tenderness; no midline spinal tenderness, step-off or deformity EXT: Normal ROM in all joints; non-tender to palpation; no edema; normal capillary refill; no cyanosis, no bony tenderness or bony deformity of patient's extremities, no joint effusion, compartments are soft, extremities are warm and well-perfused, no ecchymosis SKIN: Normal color for age and race; warm NEURO: Moves all extremities equally, no drift, difficult to evaluate for facial asymmetry, reports normal sensation, normal speech PSYCH: The patient's mood and manner are appropriate. Grooming and personal hygiene are appropriate.  ____________________________________________   LABS (all labs ordered are listed, but only abnormal results are displayed)  Labs Reviewed  URINALYSIS, COMPLETE (UACMP) WITH MICROSCOPIC - Abnormal; Notable for the following components:      Result Value   Ketones, ur TRACE (*)    All other components within normal limits  URINE DRUG SCREEN, QUALITATIVE ([facility] ONLY) - Abnormal; Notable for the following components:   Tricyclic, Ur Screen POSITIVE (*)    All other components within normal limits  COMPREHENSIVE METABOLIC PANEL - Abnormal; Notable for the following components:   Sodium 121 (*)    Potassium 3.0 (*)    Chloride 85 (*)    Glucose, Bld 107 (*)    Calcium 8.4 (*)    Total Protein 6.2 (*)    Albumin 3.4 (*)    AST 47 (*)  Alkaline  Phosphatase 36 (*)    All other components within normal limits  RESP PANEL BY RT-PCR (FLU A&B, COVID) ARPGX2  CULTURE, BLOOD (ROUTINE X 2)  CULTURE, BLOOD (ROUTINE X 2)  AMMONIA  LACTIC ACID, PLASMA  PROCALCITONIN  ETHANOL  CBC WITH DIFFERENTIAL/PLATELET  TSH  CBC WITH DIFFERENTIAL/PLATELET  OSMOLALITY, URINE  CBG MONITORING, ED   ____________________________________________  EKG   EKG Interpretation  Date/Time:  Friday September 09 2021 02:12:26 EDT Ventricular Rate:  64 PR Interval:  213 QRS Duration: 96 QT Interval:  468 QTC Calculation: 483 R Axis:   -37 Text Interpretation: Sinus rhythm Atrial premature complex Borderline prolonged PR interval Left ventricular hypertrophy Baseline wander in lead(s) I III aVL Confirmed by Rochele Raring [phone]) on 09/09/2021 2:18:41 AM        ____________________________________________  RADIOLOGY Normajean Baxter Regnia Mathwig, personally viewed and evaluated these images (plain radiographs) as part of my medical decision making, as well as reviewing the written report by the radiologist.  ED MD interpretation: CT head and cervical spine show no acute abnormality.  She has a partially thrombosed right ICA aneurysm.  Official radiology report(s): CT Angio Head W or Wo Contrast  Result Date: 09/09/2021 CLINICAL DATA:  Head trauma, mod-severe Neuro deficit, acute, stroke suspected EXAM: CT ANGIOGRAPHY HEAD TECHNIQUE: Multidetector CT imaging of the head was performed using the standard protocol during bolus administration of intravenous contrast. Multiplanar CT image reconstructions and MIPs were obtained to evaluate the vascular anatomy. CONTRAST:  51mL OMNIPAQUE IOHEXOL 350 MG/ML SOLN COMPARISON:  09/07/2021 FINDINGS: CT HEAD Brain: There is no mass, hemorrhage or extra-axial collection. The size and configuration of the ventricles and extra-axial CSF spaces are normal. There is hypoattenuation of the white matter, most commonly indicating chronic  small vessel disease. Vascular: Right ICA aneurysm is unchanged from the most recent head CT, measuring 2.8 x 2.0 cm. Skull: The visualized skull base, calvarium and extracranial soft tissues are normal. Sinuses/Orbits: No fluid levels or advanced mucosal thickening of the visualized paranasal sinuses. No mastoid or middle ear effusion. The orbits are normal. CTA HEAD POSTERIOR CIRCULATION: --Vertebral arteries: Normal --Inferior cerebellar arteries: Normal. --Basilar artery: Normal. --Superior cerebellar arteries: Normal. --Posterior cerebral arteries: Normal. ANTERIOR CIRCULATION: --Intracranial internal carotid arteries: There is a 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 --Anterior cerebral arteries (ACA): Normal. --Middle cerebral arteries (MCA): Normal. ANATOMIC VARIANTS: Fetal origin of the right PCA. Review of the MIP images confirms the above findings. IMPRESSION: 1. Partially thrombosed right ICA aneurysm with filling portion measuring up to 1.5 cm. 2. No intracranial arterial occlusion or high-grade stenosis. Electronically Signed   By: Deatra Robinson M.D.   On: 09/09/2021 03:11   CT Cervical Spine Wo Contrast  Result Date: 09/09/2021 CLINICAL DATA:  Fall EXAM: CT CERVICAL SPINE WITHOUT CONTRAST TECHNIQUE: Multidetector CT imaging of the cervical spine was performed without intravenous contrast. Multiplanar CT image reconstructions were also generated. COMPARISON:  None. FINDINGS: Alignment: No static subluxation. Facets are aligned. Occipital condyles and the lateral masses of C1 and C2 are normally approximated. Skull base and vertebrae: No acute fracture. Soft tissues and spinal canal: No prevertebral fluid or swelling. No visible canal hematoma. Disc levels: No advanced spinal canal or neural foraminal stenosis. Upper chest: Biapical emphysema. Other: Normal visualized paraspinal cervical  soft tissues. IMPRESSION: 1. No acute fracture or static subluxation of the cervical spine. Emphysema (ICD10-J43.9). Electronically Signed  By: Deatra Robinson M.D.   On: 09/09/2021 03:14   DG Chest Portable 1 View  Result Date: 09/09/2021 CLINICAL DATA:  Seizure with unresponsiveness. EXAM: PORTABLE CHEST 1 VIEW COMPARISON:  September 07, 2021 FINDINGS: Stable, diffuse, chronic appearing increased lung markings are seen. There is no evidence of acute infiltrate, pleural effusion or pneumothorax. There is a large hiatal hernia. The heart size and mediastinal contours are within normal limits. The visualized skeletal structures are unremarkable. IMPRESSION: 1. No acute cardiopulmonary disease. 2. Large hiatal hernia. Electronically Signed   By: Aram Candela M.D.   On: 09/09/2021 03:33    ____________________________________________   PROCEDURES  Procedure(s) performed (including Critical Care):  Procedures  CRITICAL CARE Performed by: Baxter Hire Ryah Cribb   Total critical care time: 45 minutes  Critical care time was exclusive of separately billable procedures and treating other patients.  Critical care was necessary to treat or prevent imminent or life-threatening deterioration.  Critical care was time spent personally by me on the following activities: development of treatment plan with patient and/or surrogate as well as nursing, discussions with consultants, evaluation of patient's response to treatment, examination of patient, obtaining history from patient or surrogate, ordering and performing treatments and interventions, ordering and review of laboratory studies, ordering and review of radiographic studies, pulse oximetry and re-evaluation of patient's condition.  ____________________________________________   INITIAL IMPRESSION / ASSESSMENT AND PLAN / ED COURSE  As part of my medical decision making, I reviewed the following data within the electronic MEDICAL RECORD NUMBER History  obtained from family, Nursing notes reviewed and incorporated, Labs reviewed , EKG interpreted , Old EKG reviewed, Old chart reviewed, Discussed with admitting physician , CTs reviewed, and Notes from prior ED visits         Patient here with an unwitnessed fall at home and then an episode of unresponsiveness.  Son reports she did have 2 to 3 seconds of shaking.  This could have been a seizure versus myoclonic jerks from a syncopal event.  She is awake and alert here but appears slightly confused.  She has difficulty opening the right eye with son reports is new over the past couple of days but she always has medial deviation of this eye.  Is difficult to tell if she has any facial asymmetry on exam as she has a hard time following some commands.  She is able to move her extremities equally.  I do not appreciate any signs of trauma on exam and she is hemodynamically stable.  Her EKG shows no ischemia or arrhythmia.  Normal intervals.  Differential includes anemia, electrolyte derangement, hepatic encephalopathy, UTI, CVA, intracranial hemorrhage, seizure, syncope from arrhythmia or cardiac event.  Will obtain labs, urine, CT of her head and cervical spine, chest x-ray.  I feel patient will likely need admission to the hospital and son agrees.  ED PROGRESS  Patient's mental status seems to be slowly improving.  CT head shows stable ICA right-sided aneurysm but no hemorrhage or high-grade stenosis.  Aneurysm is partially thrombosed.  CT cervical spine unremarkable.  Patient's labs have come back markedly abnormal per lab.  They suggest recollection especially given labs were normal 2 days ago.   6:35 AM  Repeat labs show a sodium 121.  This makes me concerned that she had a seizure at home.  She has not had any further seizure-like activity here and mental status again has improved.  Potassium level also slightly low at 3.0.  Will give oral replacement.  We  will check magnesium level.  White blood cell  count normal.  Lactic and procalcitonin normal.  Urine shows no sign of infection.  Will discuss with ICU for admission.  Son updated with plan.  Will start NS IVF.  I do not feel at this time she currently needs hypertonic saline unless she has another seizure or her mental status declines.  6:48 AM  Spoke with Webb Silversmith, NP with critical care.  She will discuss patient with the daytime ICU team for admission.   ____________________________________________   FINAL CLINICAL IMPRESSION(S) / ED DIAGNOSES  Final diagnoses:  Altered mental status, unspecified altered mental status type  Episode of unresponsiveness  Fall, initial encounter  Hyponatremia     ED Discharge Orders     None       *Please note:  Julie-Ann Vanmaanen was evaluated in Emergency Department on 09/09/2021 for the symptoms described in the history of present illness. She was evaluated in the context of the global COVID-19 pandemic, which necessitated consideration that the patient might be at risk for infection with the SARS-CoV-2 virus that causes COVID-19. Institutional protocols and algorithms that pertain to the evaluation of patients at risk for COVID-19 are in a state of rapid change based on information released by regulatory bodies including the CDC and federal and state organizations. These policies and algorithms were followed during the patient's care in the ED.  Some ED evaluations and interventions may be delayed as a result of limited staffing during and the pandemic.*   Note:  This document was prepared using Dragon voice recognition software and may include unintentional dictation errors.    Iylah Dworkin, Layla Maw, DO 09/09/21 [phone]

## 2021-09-09 NOTE — ED Notes (Signed)
Pt assisted to the rest room by this RN. Pt had some urinary incontinence when getting out of bed. Pts bed sheets changed and pads placed on bed. Pt voided 300 cc. Pt assisted back to bed by this RN.

## 2021-09-09 NOTE — ED Notes (Signed)
Pt resting in bed, alert and oriented. No complaints other than dry mouth, ice chips given. Pt appreciative.

## 2021-09-09 NOTE — ED Notes (Signed)
Pt assisted to toilet with assist of one. Pt voided . Pr bedding changed and assisted back to bed.

## 2021-09-09 NOTE — ED Notes (Signed)
Pt resting in locked and low ED bed with no complaints at this time. Call light in reach, requesting water. Alert and oriented. Repositioned for comfort.

## 2021-09-09 NOTE — ED Notes (Signed)
Pt tolerating oral liquid tray, receiving RN made aware.

## 2021-09-09 NOTE — Consult Note (Signed)
Neurosurgery consulted regarding findings of a known right cavernous ICA aneurysm with enlargement and concern for recent headache. CT shows no subarachnoid blood and LP was non-diagnostic. Given this, a formal angiogram might be useful but this procedure is not available at [facility] and therefore patient will need to be transferred to higher level facility if one is desired. Further treatment may be directed by this but risks and benefits will need to be discussed by vascular neurosurgery which is not a capability at [facility].  If patient is full code and would desire intervention, recommend transfer Can maintain SBP < 140 for now given concerns but no other management recommended at this time without advanced imaging

## 2021-09-09 NOTE — ED Notes (Signed)
ICU NP at bedside to complete pt assessment. Pt answering questions appropriately, no complaints at this time. Bed locked and low, call light in reach.

## 2021-09-09 NOTE — Progress Notes (Signed)
   09/09/21 1800  Clinical Encounter Type  Visited With Patient and family together  Visit Type Initial;Spiritual support;Social support  Referral From Other (Comment) (rounding)  Spiritual Encounters  Spiritual Needs Prayer  Chaplain Burris engaged Pt and her son while on unit. Chaplain offered compassionate presence and words of encouragement. Shared prayer with Pt; encouraged son in his self-care.

## 2021-09-09 NOTE — ED Notes (Signed)
Pt assisted with bed change. Pt resting in bed

## 2021-09-09 NOTE — ED Notes (Signed)
Pt resting in bed, assisted with medications. Patient oriented x3. Denies any other needs at this time

## 2021-09-09 NOTE — Consult Note (Signed)
NEUROLOGY CONSULTATION NOTE   Date of service: September 09, 2021 Patient Name: Briana Lozano MRN:  [id number] DOB:  1929-09-29 Reason for consult: syncope Requesting physician: Dr. Elwyn Lade Agbata _ _ _   _ __   _ __ _ _  __ __   _ __   __ _  History of Present Illness   87 yo woman with hx brain aneurysm (states she is f/b outside eye doctor for this but has not followed up in a while), COPD, HTN, sleep apnea, vertigo admitted after syncopal episode upon standing up yesterday evening. Patient states that on Monday night she was awoken from sleep with a 10/10 thunderclap worst headache of her life and it has persisted since that time, only slightly improved with tylenol, is currently 9/10. She has never had a headache like this before. Then yesterday she was standing up from a seated position, felt briefly lightheaded, then had a syncopal episode. She does not rememberwhat happened next but does not think she hit her head. Her son ran into the room a few seconds later and held her in his arms. At that point she had a very fine tremor in her BUE which he demonstrated to me more as tremulousness without rhythmic jerking. There was concern for possible seizure given Na+ 121 down from 129 two days ago.  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 She tells me this was related to her aneurysm and a cataract repair. She has no other focal neurologic deficits.   ROS   Per HPI: all other systems reviewed and are negative  Past History   I have reviewed the following:  Past Medical History:  Diagnosis Date  . Arthritis    fingers  . Brain aneurysm   . COPD (chronic obstructive pulmonary disease) (HCC)    wheezing occasionally  . COVID-19 12/19/2019   mild symptoms  . Depression   . Dyspnea    with  exertion  . GERD (gastroesophageal reflux disease)   . Headache   . Heart murmur    followed by PCP  . History of hiatal hernia   . History of phlebitis   . Hypertension   . Hypothyroidism   . Osteoporosis   . Pneumonia    in PAST  . Sleep apnea    no CPAP  . Vertigo    in past  . Wears dentures    full upper   Past Surgical History:  Procedure Laterality Date  . APPENDECTOMY    . BREAST BIOPSY    . CATARACT EXTRACTION W/PHACO Left 11/18/2019   Procedure: CATARACT EXTRACTION PHACO AND INTRAOCULAR LENS PLACEMENT (IOC) LEFT 5.77, 00:41.2;  Surgeon: Galen Manila, MD;  Location: [facility];  Service: Ophthalmology;  Laterality: Left;  sleep apnea  . CATARACT EXTRACTION W/PHACO Right 01/20/2020   Procedure: CATARACT EXTRACTION PHACO AND INTRAOCULAR LENS PLACEMENT (IOC) RIGHT;  Surgeon: Galen Manila, MD;  Location: [facility];  Service: Ophthalmology;  Laterality: Right;  Lot #[id number] H [place]: 07:12 CDE: 00:59.3   . COLONOSCOPY  2002,2008,2015  . EYE SURGERY    . HEMORRHOID SURGERY  1950  . IR ANGIO INTRA EXTRACRAN SEL COM CAROTID INNOMINATE BILAT MOD SED  04/17/2017  . IR ANGIO VERTEBRAL SEL VERTEBRAL UNI L MOD SED  04/17/2017  . REPLACEMENT TOTAL KNEE  Right 04/2005   Family History  Problem Relation Age of Onset  . Stroke Mother   . Lung cancer Father   . Lung cancer Brother   . Breast cancer Neg Hx    Social History   Socioeconomic History  . Marital status: Divorced    Spouse name: Not on file  . Number of children: Not on file  . Years of education: Not on file  . Highest education level: Not on file  Occupational History  . Not on file  Tobacco Use  . Smoking status: Never  . Smokeless tobacco: Never  Vaping Use  . Vaping Use: Never used  Substance and Sexual Activity  . Alcohol use: No  . Drug use: No  . Sexual activity: Not on file  Other Topics Concern  . Not on file  Social History Narrative  . Not on file   Social Determinants of Health    Financial Resource Strain: Not on file  Food Insecurity: Not on file  Transportation Needs: Not on file  Physical Activity: Not on file  Stress: Not on file  Social Connections: Not on file   Allergies  Allergen Reactions  . Cortisone Swelling       . Penicillins Swelling    Did it involve swelling of the face/tongue/throat, SOB, or low BP? No Did it involve sudden or severe rash/hives, skin peeling, or any reaction on the inside of your mouth or nose? No Did you need to seek medical attention at a hospital or doctor's office? No When did it last happen?      7-8 years If all above answers are "NO", may proceed with cephalosporin use.   . Valium [Diazepam] Other (See Comments)    Couldn't walk    Medications    Current Facility-Administered Medications:  .  0.9 % NaCl with KCl 40 mEq / L  infusion, , Intravenous, Continuous, Agbata, Tochukwu, MD, Last Rate: 100 mL/hr at 09/09/21 1107, New Bag at 09/09/21 1107 .  acetaminophen (TYLENOL) tablet 1,000 mg, 1,000 mg, Oral, Q8H PRN, Agbata, Tochukwu, MD .  bisoprolol (ZEBETA) tablet 2.5 mg, 2.5 mg, Oral, Daily, Agbata, Tochukwu, MD, 2.5 mg at 09/09/21 1139 .  [START ON 09/10/2021] levothyroxine (SYNTHROID) tablet 75 mcg, 75 mcg, Oral, QAC breakfast, Agbata, Tochukwu, MD .  loratadine (CLARITIN) tablet 10 mg, 10 mg, Oral, Daily, Agbata, Tochukwu, MD, 10 mg at 09/09/21 1141 .  multivitamin-lutein (OCUVITE-LUTEIN) capsule 1 capsule, 1 capsule, Oral, Daily, Tressie Ellis, RPH, 1 capsule at 09/09/21 1141 .  ondansetron (ZOFRAN) tablet 4 mg, 4 mg, Oral, Q6H PRN **OR** ondansetron (ZOFRAN) injection 4 mg, 4 mg, Intravenous, Q6H PRN, Agbata, Tochukwu, MD .  pantoprazole (PROTONIX) EC tablet 40 mg, 40 mg, Oral, Daily, Agbata, Tochukwu, MD, 40 mg at 09/09/21 1141 .  polyethylene glycol (MIRALAX / GLYCOLAX) packet 17 g, 17 g, Oral, Daily PRN, Agbata, Tochukwu, MD .  polyvinyl alcohol (LIQUIFILM TEARS) 1.4 % ophthalmic solution 1 drop, 1 drop,  Both Eyes, PRN, Tressie Ellis, RPH .  simethicone (MYLICON) chewable tablet 80 mg, 80 mg, Oral, Daily PRN, Agbata, Tochukwu, MD  Current Outpatient Medications:  .  acetaminophen (TYLENOL) 650 MG CR tablet, Take 1,000 mg by mouth every 8 (eight) hours as needed for pain., Disp: , Rfl:  .  azithromycin (ZITHROMAX Z-PAK) 250 MG tablet, Take 2 tablets (500 mg) on  Day 1,  followed by 1 tablet (250 mg) once daily on Days 2 through 5., Disp: 6 each, Rfl:  0 .  bisoprolol-hydrochlorothiazide (ZIAC) 5-6.25 MG tablet, Take 0.5 tablets by mouth every evening. , Disp: , Rfl:  .  cefpodoxime (VANTIN) 200 MG tablet, Take 1 tablet (200 mg total) by mouth 2 (two) times daily for 7 days., Disp: 14 tablet, Rfl: 0 .  citalopram (CELEXA) 20 MG tablet, Take 20 mg by mouth daily. , Disp: , Rfl:  .  cyanocobalamin (,VITAMIN B-12,) 1000 MCG/ML injection, Inject 1,000 mcg into the muscle every 30 (thirty) days., Disp: , Rfl:  .  diphenhydrAMINE-APAP, sleep, (GOODYS PM PO), Take 1 packet by mouth at bedtime., Disp: , Rfl:  .  levothyroxine (SYNTHROID) 75 MCG tablet, Take 75 mcg by mouth daily before breakfast., Disp: , Rfl:  .  loratadine (CLARITIN) 10 MG tablet, Take 10 mg by mouth daily., Disp: , Rfl:  .  losartan (COZAAR) 50 MG tablet, Take 50 mg by mouth daily. am, Disp: , Rfl:  .  Multiple Vitamin (MULTIVITAMIN WITH MINERALS) TABS tablet, Take 1 tablet by mouth daily., Disp: , Rfl:  .  Multiple Vitamins-Minerals (PRESERVISION AREDS 2 PO), Take 1 capsule by mouth 2 (two) times daily., Disp: , Rfl:  .  ondansetron (ZOFRAN ODT) 4 MG disintegrating tablet, Take 1 tablet (4 mg total) by mouth every 8 (eight) hours as needed for nausea or vomiting., Disp: 12 tablet, Rfl: 0 .  polyethylene glycol (MIRALAX / GLYCOLAX) packet, Take 17 g by mouth daily as needed for mild constipation. , Disp: , Rfl:  .  Simethicone (PHAZYME MAXIMUM STRENGTH) 250 MG CAPS, Take 250-500 mg by mouth daily as needed (gas)., Disp: , Rfl:  .   aspirin EC 81 MG tablet, Take 81 mg by mouth daily. (Patient not taking: No sig reported), Disp: , Rfl:  .  famotidine (PEPCID) 20 MG tablet, Take 20 mg by mouth 2 (two) times daily. (Patient not taking: No sig reported), Disp: , Rfl:  .  guaiFENesin (MUCINEX) 600 MG 12 hr tablet, Take 600 mg by mouth 2 (two) times daily as needed for cough or to loosen phlegm. (Patient not taking: Reported on 09/09/2021), Disp: , Rfl:  .  levothyroxine (SYNTHROID) 88 MCG tablet, Take 1 tablet by mouth daily. (Patient not taking: Reported on 09/09/2021), Disp: , Rfl:  .  pantoprazole (PROTONIX) 40 MG tablet, Take 1 tablet by mouth daily. (Patient not taking: Reported on 09/09/2021), Disp: , Rfl:  .  Polyethyl Glycol-Propyl Glycol (SYSTANE OP), Place 1 drop into both eyes daily as needed (dry eyes). , Disp: , Rfl:   Vitals   Vitals:   09/09/21 0500 09/09/21 0730 09/09/21 0930 09/09/21 1230  BP: (!) 149/74 (!) 165/76 (!) 156/77 126/69  Pulse: (!) 59 60 62 63  Resp: (!) 24 16 (!) 22 16  Temp:      TempSrc:      SpO2: 94% 96% 95% 96%  Weight:      Height:         Body mass index is 22.91 kg/m.  Physical Exam   Physical Exam Gen: A&O x4, visibly uncomfortable and complaining of head pain HEENT: Atraumatic, normocephalic;mucous membranes moist; oropharynx clear, tongue without atrophy or fasciculations. Neck: Supple, trachea midline. Resp: CTAB, no w/r/r CV: RRR, no m/g/r; nml S1 and S2. 2+ symmetric peripheral pulses. Abd: soft/NT/ND; nabs x 4 quad Extrem: Nml bulk; no cyanosis, clubbing, or edema.  Neuro: *MS: A&O x4. Follows multi-step commands.  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  Smile symmetric.   VIII: Hearing intact to voice   IX,X: Voice normal, palate elevates symmetrically    XI: SCM/trap 5/5 bilat   XII: Tongue protrudes midline, no atrophy or fasciculations   *Motor:   Normal bulk.  No tremor, rigidity or bradykinesia. No pronator drift.    Strength: Dlt Bic Tri WrE WrF FgS Gr HF KnF KnE PlF DoF    Left 5 5 5 5 5 5 5 5 5 5 5 5     Right 5 5 5 5 5 5 5 5 5 5 5 5     *Sensory: Intact to light touch, pinprick, temperature vibration throughout. Symmetric. Propioception intact bilat.  No double-simultaneous extinction.  *Coordination:  Finger-to-nose, heel-to-shin, rapid alternating motions were intact. *Reflexes:  2+ and symmetric throughout without clonus; toes down-going bilat *Gait: deferred    Labs   CBC:  Recent Labs  Lab 09/06/21 2233 09/07/21 0808 09/09/21 0517  WBC 8.5 7.3 10.0  NEUTROABS 5.8  --  7.3  HGB 15.4* 15.0 14.1  HCT 42.0 40.3 RESULTS UNAVAILABLE DUE TO INTERFERING SUBSTANCE  MCV 98.4 96.2 RESULTS UNAVAILABLE DUE TO INTERFERING SUBSTANCE  PLT 190 186 201    Basic Metabolic Panel:  Lab Results  Component Value Date   NA 121 (L) 09/09/2021   K 3.0 (L) 09/09/2021   CO2 29 09/09/2021   GLUCOSE 107 (H) 09/09/2021   BUN 14 09/09/2021   CREATININE 0.63 09/09/2021   CALCIUM 8.4 (L) 09/09/2021   GFRNONAA >60 09/09/2021   GFRAA >60 01/08/2018   Lipid Panel: No results found for: LDLCALC HgbA1c: No results found for: HGBA1C Urine Drug Screen:     Component Value Date/Time   LABOPIA NONE DETECTED 09/09/2021 0344   COCAINSCRNUR NONE DETECTED 09/09/2021 0344   LABBENZ NONE DETECTED 09/09/2021 0344   AMPHETMU NONE DETECTED 09/09/2021 0344   THCU NONE DETECTED 09/09/2021 0344   LABBARB NONE DETECTED 09/09/2021 0344    Alcohol Level     Component Value Date/Time   ETH <10 09/09/2021 0212     Impression   87 yo woman with hx COPD, HTN, sleep apnea, vertigo admitted for worst headache of her life + syncope in  the setting of known giant R ICA aneurysm and hyponatremia. The event last night sounds more concerning for convulsive syncope than for seizure, although the acute hyponatremia would put her at risk for seizure and it remains on the differential. I am concerned about the headache 10/10, maximal at onset, woke her up from sleep, persistent x5 days and unlike any headache she has ever had before. Particularly in the setting of her known aneurysm we need to r/o SAH. Imaging cannot rule out SAH >6 hrs after sx onset so she will undergo LP. Plan d/c Dr. Joylene Igo and Dr. Judie Bonus of Neuro IR.  Recommendations   - LP w/ cell count in tubes 1 and 4, protein, glucose, culture - Further recommendations based on LP results ______________________________________________________________________   Thank you for the opportunity to take part in the care of this patient. If you have any further questions, please contact the neurology consultation attending.  Signed,  Jill Side  Selina Cooley, MD [facility] [phone]  If 7pm- 7am, please page neurology on call as listed in [facility]ION.

## 2021-09-09 NOTE — ED Notes (Signed)
Ice water sips and 3 spoonfuls of applesauce given by this RN for oral potassium administration. Pt tolerated well

## 2021-09-09 NOTE — ED Notes (Signed)
Pt transported to CT at this time.

## 2021-09-09 NOTE — ED Provider Notes (Signed)
ICU team evaluated patient, advises does not need ICU level care and recommended admit to hospitalist per Dr. Belia Heman.  Spoke with Dr. Joylene Igo who will eval for admit to hospitalist service at this time.   Sharyn Creamer, MD 09/09/21 [phone]

## 2021-09-09 NOTE — ED Notes (Signed)
Patient assisted to toilet with assist of one. Pt voided and assisted to bed. Pt resting in bed.

## 2021-09-09 NOTE — Consult Note (Signed)
[facility]  CONSULT NOTE    Date: 09/09/2021                  Patient Name:  Briana Lozano  MRN: [id number]  DOB: [birth year]  Age / Sex: 87 y.o., female         PCP: Juluis Pitch, MD                 Service Requesting Consult: [place]                 Reason for Consult: Hyponatremia            History of Present Illness: Briana Lozano is a 87 y.o.  female with medical history of GERD, COPD, and hypertension, who was admitted to [facility] on 09/09/2021 for Hyponatremia [E87.1]  Patient presents to ED via EMS after unwitnessed fall. Patient states she has had nausea and vomiting since Monday. She states this has caused her to have a decreased appetite. She has tried to maintain her fluid intake with water and pedialyte. She has continued to take all medications. Denies shortness of breath and cough. Denies diarrhea, fevers and chills. States she developed a headache on Wednesday. She reports waking this morning with the same headache and fell after getting dizzy.   Labs on arrival include sodium 121, potassium 3.0, creatinine 0.63, magnesium 1.7 and eGFR >60. Chest Xray only shows large hiatal hernia. CT head shos no intracranial occlusion, but partial thrombosed right ICA aneurysm. CT cervical spine negative for fracture  We have been consulted to manage hyponatremia.   Medications: Outpatient medications: (Not in a hospital admission)   Current medications: Current Facility-Administered Medications  Medication Dose Route Frequency Provider Last Rate Last Admin   0.9 % NaCl with KCl 40 mEq / L  infusion   Intravenous Continuous Agbata, Tochukwu, MD 100 mL/hr at 09/09/21 1107 New Bag at 09/09/21 1107   acetaminophen (TYLENOL) tablet 1,000 mg  1,000 mg Oral Q8H PRN Agbata, Tochukwu, MD   1,000 mg at 09/09/21 1657   bisoprolol (ZEBETA) tablet 2.5 mg  2.5 mg Oral Daily Agbata, Tochukwu, MD   2.5 mg at 09/09/21 1139   [START ON 09/10/2021]  levothyroxine (SYNTHROID) tablet 75 mcg  75 mcg Oral QAC breakfast Agbata, Tochukwu, MD       loratadine (CLARITIN) tablet 10 mg  10 mg Oral Daily Agbata, Tochukwu, MD   10 mg at 09/09/21 1141   multivitamin-lutein (OCUVITE-LUTEIN) capsule 1 capsule  1 capsule Oral Daily Benita Gutter, RPH   1 capsule at 09/09/21 1141   ondansetron (ZOFRAN) tablet 4 mg  4 mg Oral Q6H PRN Agbata, Tochukwu, MD       Or   ondansetron (ZOFRAN) injection 4 mg  4 mg Intravenous Q6H PRN Agbata, Tochukwu, MD       pantoprazole (PROTONIX) EC tablet 40 mg  40 mg Oral Daily Agbata, Tochukwu, MD   40 mg at 09/09/21 1141   polyethylene glycol (MIRALAX / GLYCOLAX) packet 17 g  17 g Oral Daily PRN Agbata, Tochukwu, MD       polyvinyl alcohol (LIQUIFILM TEARS) 1.4 % ophthalmic solution 1 drop  1 drop Both Eyes PRN Benita Gutter, RPH       simethicone (MYLICON) chewable tablet 80 mg  80 mg Oral Daily PRN Agbata, Tochukwu, MD       Current Outpatient Medications  Medication Sig Dispense Refill   acetaminophen (TYLENOL) 650 MG CR tablet  Take 1,000 mg by mouth every 8 (eight) hours as needed for pain.     azithromycin (ZITHROMAX Z-PAK) 250 MG tablet Take 2 tablets (500 mg) on  Day 1,  followed by 1 tablet (250 mg) once daily on Days 2 through 5. 6 each 0   bisoprolol-hydrochlorothiazide (ZIAC) 5-6.25 MG tablet Take 0.5 tablets by mouth every evening.      cefpodoxime (VANTIN) 200 MG tablet Take 1 tablet (200 mg total) by mouth 2 (two) times daily for 7 days. 14 tablet 0   citalopram (CELEXA) 20 MG tablet Take 20 mg by mouth daily.      cyanocobalamin (,VITAMIN B-12,) 1000 MCG/ML injection Inject 1,000 mcg into the muscle every 30 (thirty) days.     diphenhydrAMINE-APAP, sleep, (GOODYS PM PO) Take 1 packet by mouth at bedtime.     levothyroxine (SYNTHROID) 75 MCG tablet Take 75 mcg by mouth daily before breakfast.     loratadine (CLARITIN) 10 MG tablet Take 10 mg by mouth daily.     losartan (COZAAR) 50 MG tablet Take 50 mg  by mouth daily. am     Multiple Vitamin (MULTIVITAMIN WITH MINERALS) TABS tablet Take 1 tablet by mouth daily.     Multiple Vitamins-Minerals (PRESERVISION AREDS 2 PO) Take 1 capsule by mouth 2 (two) times daily.     ondansetron (ZOFRAN ODT) 4 MG disintegrating tablet Take 1 tablet (4 mg total) by mouth every 8 (eight) hours as needed for nausea or vomiting. 12 tablet 0   polyethylene glycol (MIRALAX / GLYCOLAX) packet Take 17 g by mouth daily as needed for mild constipation.      Simethicone (PHAZYME MAXIMUM STRENGTH) 250 MG CAPS Take 250-500 mg by mouth daily as needed (gas).     aspirin EC 81 MG tablet Take 81 mg by mouth daily. (Patient not taking: No sig reported)     famotidine (PEPCID) 20 MG tablet Take 20 mg by mouth 2 (two) times daily. (Patient not taking: No sig reported)     guaiFENesin (MUCINEX) 600 MG 12 hr tablet Take 600 mg by mouth 2 (two) times daily as needed for cough or to loosen phlegm. (Patient not taking: Reported on 09/09/2021)     levothyroxine (SYNTHROID) 88 MCG tablet Take 1 tablet by mouth daily. (Patient not taking: Reported on 09/09/2021)     pantoprazole (PROTONIX) 40 MG tablet Take 1 tablet by mouth daily. (Patient not taking: Reported on 09/09/2021)     Polyethyl Glycol-Propyl Glycol (SYSTANE OP) Place 1 drop into both eyes daily as needed (dry eyes).         Allergies: Allergies  Allergen Reactions   Cortisone Swelling        Penicillins Swelling    Did it involve swelling of the face/tongue/throat, SOB, or low BP? No Did it involve sudden or severe rash/hives, skin peeling, or any reaction on the inside of your mouth or nose? No Did you need to seek medical attention at a hospital or doctor's office? No When did it last happen?      7-8 years If all above answers are "NO", may proceed with cephalosporin use.    Valium [Diazepam] Other (See Comments)    Couldn't walk      Past Medical History: Past Medical History:  Diagnosis Date   Arthritis     fingers   Brain aneurysm    COPD (chronic obstructive pulmonary disease) ([place])    wheezing occasionally   COVID-19 12/19/2019   mild symptoms  Depression    Dyspnea    with exertion   GERD (gastroesophageal reflux disease)    Headache    Heart murmur    followed by PCP   History of hiatal hernia    History of phlebitis    Hypertension    Hypothyroidism    Osteoporosis    Pneumonia    in PAST   Sleep apnea    no CPAP   Vertigo    in past   Wears dentures    full upper     Past Surgical History: Past Surgical History:  Procedure Laterality Date   APPENDECTOMY     BREAST BIOPSY     CATARACT EXTRACTION W/PHACO Left 11/18/2019   Procedure: CATARACT EXTRACTION PHACO AND INTRAOCULAR LENS PLACEMENT (IOC) LEFT 5.77, 00:41.2;  Surgeon: Birder Robson, MD;  Location: [facility];  Service: Ophthalmology;  Laterality: Left;  sleep apnea   CATARACT EXTRACTION W/PHACO Right 01/20/2020   Procedure: CATARACT EXTRACTION PHACO AND INTRAOCULAR LENS PLACEMENT (IOC) RIGHT;  Surgeon: Birder Robson, MD;  Location: [facility];  Service: Ophthalmology;  Laterality: Right;  Lot #[id number] H [place]: 07:12 CDE: 00:59.3    COLONOSCOPY  2002,2008,2015   EYE SURGERY     HEMORRHOID SURGERY  1950   IR ANGIO INTRA EXTRACRAN SEL COM CAROTID INNOMINATE BILAT MOD SED  04/17/2017   IR ANGIO VERTEBRAL SEL VERTEBRAL UNI L MOD SED  04/17/2017   REPLACEMENT TOTAL KNEE Right 04/2005     Family History: Family History  Problem Relation Age of Onset   Stroke Mother    Lung cancer Father    Lung cancer Brother    Breast cancer Neg Hx      Social History: Social History   Socioeconomic History   Marital status: Divorced    Spouse name: Not on file   Number of children: Not on file   Years of education: Not on file   Highest education level: Not on file  Occupational History   Not on file  Tobacco Use   Smoking status: Never   Smokeless tobacco: Never  Vaping Use   Vaping Use: Never used   Substance and Sexual Activity   Alcohol use: No   Drug use: No   Sexual activity: Not on file  Other Topics Concern   Not on file  Social History Narrative   Not on file   Social Determinants of Health   Financial Resource Strain: Not on file  Food Insecurity: Not on file  Transportation Needs: Not on file  Physical Activity: Not on file  Stress: Not on file  Social Connections: Not on file  Intimate Partner Violence: Not on file     Review of Systems: Review of Systems  Gastrointestinal:  Positive for nausea.  Musculoskeletal:  Positive for falls.  Neurological:  Positive for headaches.  All other systems reviewed and are negative.  Vital Signs: Blood pressure (!) 157/95, pulse 63, temperature 98.2 F (36.8 C), resp. rate 18, height '5\' 1"'  (1.549 m), weight 55 kg, SpO2 100 %.  Weight trends: Filed Weights   09/09/21 0208  Weight: 55 kg    Physical Exam: General: NAD, resting on stretcher  Head: Normocephalic, atraumatic. Moist oral mucosal membranes  Eyes: Anicteric  Neck: Supple, trachea midline  Lungs:  Clear to auscultation  Heart: Regular rate and rhythm  Abdomen:  Soft, nontender  Extremities:  no peripheral edema.  Neurologic: Alert, moving all four extremities  Skin: No lesions  Lab results: Basic Metabolic Panel: Recent Labs  Lab 09/06/21 2233 09/07/21 0808 09/09/21 0517  NA 129* 130* 121*  K 4.0 3.8 3.0*  CL 94* 90* 85*  CO2 '24 25 29  ' GLUCOSE 135* 151* 107*  BUN '10 9 14  ' CREATININE 0.79 0.61 0.63  CALCIUM 9.2 9.2 8.4*  MG  --   --  1.7    Liver Function Tests: Recent Labs  Lab 09/06/21 2233 09/07/21 0808 09/09/21 0517  AST 26 33 47*  ALT '15 14 23  ' ALKPHOS 55 49 36*  BILITOT 1.0 1.1 1.1  PROT 7.7 7.2 6.2*  ALBUMIN 4.0 4.0 3.4*   Recent Labs  Lab 09/07/21 0808  LIPASE 30   Recent Labs  Lab 09/09/21 0212  AMMONIA 10    CBC: Recent Labs  Lab 09/06/21 2233 09/07/21 0808 09/09/21 0517  WBC 8.5 7.3 10.0   NEUTROABS 5.8  --  7.3  HGB 15.4* 15.0 14.1  HCT 42.0 40.3 RESULTS UNAVAILABLE DUE TO INTERFERING SUBSTANCE  MCV 98.4 96.2 RESULTS UNAVAILABLE DUE TO INTERFERING SUBSTANCE  PLT 190 186 201    Cardiac Enzymes: No results for input(s): CKTOTAL, CKMB, CKMBINDEX, TROPONINI in the last 168 hours.  BNP: Invalid input(s): POCBNP  CBG: No results for input(s): GLUCAP in the last 168 hours.  Microbiology: Results for orders placed or performed during the hospital encounter of 09/09/21  Culture, blood (Routine X 2) w Reflex to ID Panel     Status: None (Preliminary result)   Collection Time: 09/09/21  2:12 AM   Specimen: BLOOD  Result Value Ref Range Status   Specimen Description BLOOD LEFT ASSIST CONTROL  Final   Special Requests   Final    BOTTLES DRAWN AEROBIC AND ANAEROBIC Blood Culture adequate volume   Culture   Final    NO GROWTH < 12 HOURS Performed at [facility], [address] [place], [place] [address]    Report Status PENDING  Incomplete  Culture, blood (Routine X 2) w Reflex to ID Panel     Status: None (Preliminary result)   Collection Time: 09/09/21  2:12 AM   Specimen: BLOOD  Result Value Ref Range Status   Specimen Description BLOOD RIGHT ASSIST CONTROL  Final   Special Requests   Final    BOTTLES DRAWN AEROBIC AND ANAEROBIC Blood Culture adequate volume   Culture   Final    NO GROWTH < 12 HOURS Performed at [facility], [address] [place], [place] [address]    Report Status PENDING  Incomplete  Resp Panel by RT-PCR (Flu A&B, Covid) Nasopharyngeal Swab     Status: None   Collection Time: 09/09/21  2:12 AM   Specimen: Nasopharyngeal Swab; Nasopharyngeal(NP) swabs in vial transport medium  Result Value Ref Range Status   SARS Coronavirus 2 by RT PCR NEGATIVE NEGATIVE Final    Comment: (NOTE) SARS-CoV-2 target nucleic acids are NOT DETECTED.  The SARS-CoV-2 RNA is generally detectable in upper respiratory specimens during the acute  phase of infection. The lowest concentration of SARS-CoV-2 viral copies this assay can detect is 138 copies/mL. A negative result does not preclude SARS-Cov-2 infection and should not be used as the sole basis for treatment or other patient management decisions. A negative result may occur with  improper specimen collection/handling, submission of specimen other than nasopharyngeal swab, presence of viral mutation(s) within the areas targeted by this assay, and inadequate number of viral copies(<138 copies/mL). A negative result must be combined with  clinical observations, patient history, and epidemiological information. The expected result is Negative.  Fact Sheet for Patients:  EntrepreneurPulse.com.au  Fact Sheet for Healthcare Providers:  IncredibleEmployment.be  This test is no t yet approved or cleared by the [place] FDA and  has been authorized for detection and/or diagnosis of SARS-CoV-2 by FDA under an Emergency Use Authorization (EUA). This EUA will remain  in effect (meaning this test can be used) for the duration of the COVID-19 declaration under Section 564(b)(1) of the Act, 21 U.S.C.section 360bbb-3(b)(1), unless the authorization is terminated  or revoked sooner.       Influenza A by PCR NEGATIVE NEGATIVE Final   Influenza B by PCR NEGATIVE NEGATIVE Final    Comment: (NOTE) The Xpert Xpress SARS-CoV-2/FLU/RSV plus assay is intended as an aid in the diagnosis of influenza from Nasopharyngeal swab specimens and should not be used as a sole basis for treatment. Nasal washings and aspirates are unacceptable for Xpert Xpress SARS-CoV-2/FLU/RSV testing.  Fact Sheet for Patients: EntrepreneurPulse.com.au  Fact Sheet for Healthcare Providers: IncredibleEmployment.be  This test is not yet approved or cleared by the [place] FDA and has been authorized for detection and/or diagnosis of  SARS-CoV-2 by FDA under an Emergency Use Authorization (EUA). This EUA will remain in effect (meaning this test can be used) for the duration of the COVID-19 declaration under Section 564(b)(1) of the Act, 21 U.S.C. section 360bbb-3(b)(1), unless the authorization is terminated or revoked.  Performed at [facility], [address] [place], [place] [address]     Coagulation Studies: No results for input(s): LABPROT, INR in the last 72 hours.  Urinalysis: Recent Labs    09/06/21 2233 09/09/21 0344  COLORURINE YELLOW* YELLOW  LABSPEC 1.012 1.015  PHURINE 6.0 6.5  GLUCOSEU NEGATIVE NEGATIVE  HGBUR NEGATIVE NEGATIVE  BILIRUBINUR NEGATIVE NEGATIVE  KETONESUR NEGATIVE TRACE*  PROTEINUR NEGATIVE NEGATIVE  NITRITE NEGATIVE NEGATIVE  LEUKOCYTESUR NEGATIVE NEGATIVE      Imaging: CT Angio Head W or Wo Contrast  Result Date: 09/09/2021 CLINICAL DATA:  Head trauma, mod-severe Neuro deficit, acute, stroke suspected EXAM: CT ANGIOGRAPHY HEAD TECHNIQUE: Multidetector CT imaging of the head was performed using the standard protocol during bolus administration of intravenous contrast. Multiplanar CT image reconstructions and MIPs were obtained to evaluate the vascular anatomy. CONTRAST:  44m OMNIPAQUE IOHEXOL 350 MG/ML SOLN COMPARISON:  09/07/2021 FINDINGS: CT HEAD Brain: There is no mass, hemorrhage or extra-axial collection. The size and configuration of the ventricles and extra-axial CSF spaces are normal. There is hypoattenuation of the white matter, most commonly indicating chronic small vessel disease. Vascular: Right ICA aneurysm is unchanged from the most recent head CT, measuring 2.8 x 2.0 cm. Skull: The visualized skull base, calvarium and extracranial soft tissues are normal. Sinuses/Orbits: No fluid levels or advanced mucosal thickening of the visualized paranasal sinuses. No mastoid or middle ear effusion. The orbits are normal. CTA HEAD POSTERIOR CIRCULATION: --Vertebral  arteries: Normal --Inferior cerebellar arteries: Normal. --Basilar artery: Normal. --Superior cerebellar arteries: Normal. --Posterior cerebral arteries: Normal. ANTERIOR CIRCULATION: --Intracranial internal carotid arteries: There is a 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 --Anterior cerebral arteries (ACA): Normal. --Middle cerebral arteries (MCA): Normal. ANATOMIC VARIANTS: Fetal origin of the right PCA. Review of the MIP images confirms the above findings. IMPRESSION: 1. Partially thrombosed right ICA aneurysm with filling portion measuring up to 1.5 cm. 2. No intracranial arterial occlusion or  high-grade stenosis. Electronically Signed   By: Ulyses Jarred M.D.   On: 09/09/2021 03:11   CT Cervical Spine Wo Contrast  Result Date: 09/09/2021 CLINICAL DATA:  Fall EXAM: CT CERVICAL SPINE WITHOUT CONTRAST TECHNIQUE: Multidetector CT imaging of the cervical spine was performed without intravenous contrast. Multiplanar CT image reconstructions were also generated. COMPARISON:  None. FINDINGS: Alignment: No static subluxation. Facets are aligned. Occipital condyles and the lateral masses of C1 and C2 are normally approximated. Skull base and vertebrae: No acute fracture. Soft tissues and spinal canal: No prevertebral fluid or swelling. No visible canal hematoma. Disc levels: No advanced spinal canal or neural foraminal stenosis. Upper chest: Biapical emphysema. Other: Normal visualized paraspinal cervical soft tissues. IMPRESSION: 1. No acute fracture or static subluxation of the cervical spine. Emphysema (ICD10-J43.9). Electronically Signed   By: Ulyses Jarred M.D.   On: 09/09/2021 03:14   DG Chest Portable 1 View  Result Date: 09/09/2021 CLINICAL DATA:  Seizure with unresponsiveness. EXAM: PORTABLE CHEST 1 VIEW COMPARISON:  September 07, 2021 FINDINGS: Stable, diffuse, chronic appearing increased  lung markings are seen. There is no evidence of acute infiltrate, pleural effusion or pneumothorax. There is a large hiatal hernia. The heart size and mediastinal contours are within normal limits. The visualized skeletal structures are unremarkable. IMPRESSION: 1. No acute cardiopulmonary disease. 2. Large hiatal hernia. Electronically Signed   By: Virgina Norfolk M.D.   On: 09/09/2021 03:33     Assessment & Plan: Ms. Ketara Cavness is a 87 y.o.  female with medical history of GERD, COPD, and hypertension, who was admitted to [facility] on 09/09/2021 for Hyponatremia [E87.1]  Hyponatremia likely due to diuretic use in the setting of dehydration and nausea. Patient prescribed bisoprolol-hydrochlorothiazide for hypertension and continued to taking this medication. Recommend holding diuretics and continuing IVF. Will monitor sodium levels with conservative measures.   Hypertension Home regimen includes Losartan and bisoprolol-hydrochlorothiazide. Diuretic held at this time. BP 167/80     LOS: 0 Marcie Shearon 9/30/20225:15 PM

## 2021-09-09 NOTE — Progress Notes (Signed)
Neurology Update  CTH showed no e/o SAH but has low sensitivity at day 5 from sx onset. LP was non-diagnostic. I had a long discussion with Dr. Adriana Simas of [facility] by phone. Given known R cavernous ICA aneurysm with enlargement and sx c/f SAH (thunderclap headache maximal at onset, persistent 10/10 + syncope) or at least symptomatic of a sentinel bleed, a formal angiogram may be useful. I attempted multiple times to get in touch with son/HCPOA by phone and got VM. Per ED RN he has left the hospital for the night. Given her age and frailty risks associated with definitive tx of this aneurysm are high. We need to discuss with patient and son/HCPOA to ensure this aggressive mgmt would be in line with their GOC. Dr. Adriana Simas and I plan to meet with patient and son tomorrow when he returns to hospital.   Interim recommendations: - Transfer to stepdown at [facility] - Q4 hr neurochecks - STAT head CT for any decline in exam - IV labetalol or clevidipine gtt for goal SBP <140 - No antiplatelets or anticoagulants incl DVT prophylaxis. SCDs - Page Patrc[facility] neurohospitalist or telespecialists for questions overnight  Will f/u in AM.  Bing Neighbors, MD [facility] [phone]  If 7pm- 7am, please page neurology on call as listed in AMION.

## 2021-09-09 NOTE — ED Provider Notes (Signed)
-----------------------------------------   4:52 PM on 09/09/2021 ----------------------------------------- I was asked by neurology to perform lumbar puncture due to concern for subarachnoid hemorrhage in the setting of severe headache with large intracranial aneurysm.  Risks and benefits were discussed with patient and son, who both agreed to proceed with procedure.  Procedure was performed and obtained 3 tubes of bloody, very unlikely to be true CSF and more likely traumatic tap.  Neurology agrees and will place order for LP to be performed by IR.  Dr. Selina Cooley of neurology will also update hospitalist.   .Lumbar Puncture  Date/Time: 09/09/2021 4:51 PM Performed by: Chesley Noon, MD Authorized by: Chesley Noon, MD   Consent:    Consent obtained:  Verbal   Consent given by:  Patient and healthcare agent (Son)   Risks discussed:  Repeat procedure, pain, infection, nerve damage, headache and bleeding Universal protocol:    Procedure explained and questions answered to patient or proxy's satisfaction: yes     Relevant documents present and verified: yes     Test results available: yes     Imaging studies available: yes     Required blood products, implants, devices, and special equipment available: yes     Immediately prior to procedure a time out was called: yes     Site/side marked: yes     Patient identity confirmed:  Verbally with patient and arm band Pre-procedure details:    Procedure purpose:  Diagnostic   Preparation: Patient was prepped and draped in usual sterile fashion   Anesthesia:    Anesthesia method:  Local infiltration   Local anesthetic:  Lidocaine 1% w/o epi Procedure details:    Lumbar space:  L4-L5 interspace   Patient position:  Sitting   Needle gauge:  20   Needle type:  Spinal needle - Quincke tip   Ultrasound guidance: no     Number of attempts:  1   Fluid appearance:  Bloody   Tubes of fluid:  3   Total volume (ml):  3 Post-procedure details:     Puncture site:  Adhesive bandage applied and direct pressure applied   Procedure completion:  Tolerated well, no immediate complications    Chesley Noon, MD 09/09/21 1726

## 2021-09-09 NOTE — Progress Notes (Signed)
Contacted by neurologist who has seen patient in consultation and is concerned about a possible subarachnoid hemorrhage in this patient with new onset headache. Lumbar puncture is planned and further treatment recommendation will be based on the results

## 2021-09-09 NOTE — ED Triage Notes (Signed)
Pt to ED via [facility] from home with c/o fall. Per EMS pt with c/o unwitnessed fall. Per EMS possible start of "stroke" due to not being able to open R eye however pupils equal and reactive, per EMS pt c/o HA since being seen and treated for pneumonia 2 days ago. Per EMS pt with equal grip strengths. Per EMS unknown LKW.   98.4 CBG 134 EtCO2 24-28 92-93% RA 23-30RR

## 2021-09-09 NOTE — ED Triage Notes (Signed)
Pt's son to bedside, reports pt fell out of bed, heard a thump, went to get patient up out of floor and patient began shaking and became unrepsonsive, reports unresponsive episode lasted approx 5 minutes, no resp arrest, no CPR administered on scene. 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

## 2021-09-09 NOTE — H&P (Addendum)
History and Physical    Briana Lozano CBS:[id number] DOB: 1929/01/16 DOA: 09/09/2021  PCP: Dorothey Baseman, MD   Patient coming from: Home  I have personally briefly reviewed patient's old medical records in [facility]  Chief Complaint: Weakness  HPI: Briana Lozano is a 87 y.o. female with medical history significant for COPD, GERD, hypertension, depression who presents to the emergency room via EMS for evaluation of an unwitnessed fall. Patient states that she was seen in the emergency room 2 days prior to this admission for evaluation of nausea, headache and poor oral intake.  Nausea was associated with multiple episodes of vomiting but she denied having any abdominal pain or any changes in her bowel habits. Her symptoms improved in the emergency room and she was discharged home on oral antibiotics for presumed community-acquired pneumonia. On the morning of her admission patient states that she was so weak from not eating and drinking and she got up to use the bathroom and fell.  She denies loss of consciousness but states that she was dizzy or lightheaded when she got up.  She states that her son had her fall and found her on the floor.  She was unable to get up due to significant weakness. According to the son, he states that patient had an episode of shaking for about 2 to 3 seconds and was unresponsive for about 5 minutes but never stopped breathing.  There was no documentation of urinary or fecal incontinence or any tongue bite. Patient does not have a known history of seizures. She denies having any chest pain, no shortness of breath, no abdominal pain, no fever, no chills, no palpitations, no diaphoresis, no lower extremity swelling, no urinary symptoms, no blurred vision no focal deficits. Labs show sodium 121 compared to 130 on 09/28, potassium 3.0, chloride 95, bicarb 29, glucose 107, BUN 14, creatinine 0.63, calcium 8.4, magnesium 1.7, alkaline phosphatase 36,  albumin 3.4, AST 47, ALT 23, total protein 6.2, white count 10.0, hemoglobin 14.1, platelet count 201, TSH 2.11, urine osmolality is 428 Urinalysis is negative Urine drug screen is positive for tricyclic CT angiogram of the head and neck shows partially thrombosed right ICA aneurysm with filling portion measuring up to 1.5 cm.  No intracranial arterial occlusion or high-grade stenosis. Cervical spine CT shows no acute fracture or subluxation of the cervical spine. Chest x-ray reviewed by me shows no acute cardiopulmonary disease.  Large hiatal hernia. Twelve-lead EKG reviewed by me shows sinus rhythm with PACs and LVH  ED Course: Patient is an 87 year old female who was brought into the ER by EMS after she had an unwitnessed fall at home.  Her son who found her on the floor states that she had some jerking movements and was unresponsive for a brief period of time concerning for new seizure. Patient was found to have a serum sodium of 121 compared to a baseline of 130, 2 days ago. She has had poor oral intake due to nausea and vomiting for about 2 days and is also on hydrochlorothiazide. Patient was started on IV fluids and will be admitted to the hospital for further evaluation.    Review of Systems: As per HPI otherwise all other systems reviewed and negative.    Past Medical History:  Diagnosis Date   Arthritis    fingers   Brain aneurysm    COPD (chronic obstructive pulmonary disease) (HCC)    wheezing occasionally   COVID-19 12/19/2019   mild symptoms  Depression    Dyspnea    with exertion   GERD (gastroesophageal reflux disease)    Headache    Heart murmur    followed by PCP   History of hiatal hernia    History of phlebitis    Hypertension    Hypothyroidism    Osteoporosis    Pneumonia    in PAST   Sleep apnea    no CPAP   Vertigo    in past   Wears dentures    full upper    Past Surgical History:  Procedure Laterality Date   APPENDECTOMY     BREAST BIOPSY      CATARACT EXTRACTION W/PHACO Left 11/18/2019   Procedure: CATARACT EXTRACTION PHACO AND INTRAOCULAR LENS PLACEMENT (IOC) LEFT 5.77, 00:41.2;  Surgeon: Galen Manila, MD;  Location: [facility];  Service: Ophthalmology;  Laterality: Left;  sleep apnea   CATARACT EXTRACTION W/PHACO Right 01/20/2020   Procedure: CATARACT EXTRACTION PHACO AND INTRAOCULAR LENS PLACEMENT (IOC) RIGHT;  Surgeon: Galen Manila, MD;  Location: [facility];  Service: Ophthalmology;  Laterality: Right;  Lot #[id number] H [place]: 07:12 CDE: 00:59.3    COLONOSCOPY  2002,2008,2015   EYE SURGERY     HEMORRHOID SURGERY  1950   IR ANGIO INTRA EXTRACRAN SEL COM CAROTID INNOMINATE BILAT MOD SED  04/17/2017   IR ANGIO VERTEBRAL SEL VERTEBRAL UNI L MOD SED  04/17/2017   REPLACEMENT TOTAL KNEE Right 04/2005     reports that she has never smoked. She has never used smokeless tobacco. She reports that she does not drink alcohol and does not use drugs.  Allergies  Allergen Reactions   Cortisone Swelling        Penicillins Swelling    Did it involve swelling of the face/tongue/throat, SOB, or low BP? No Did it involve sudden or severe rash/hives, skin peeling, or any reaction on the inside of your mouth or nose? No Did you need to seek medical attention at a hospital or doctor's office? No When did it last happen?      7-8 years If all above answers are "NO", may proceed with cephalosporin use.    Valium [Diazepam] Other (See Comments)    Couldn't walk    Family History  Problem Relation Age of Onset   Stroke Mother    Lung cancer Father    Lung cancer Brother    Breast cancer Neg Hx       Prior to Admission medications   Medication Sig Start Date End Date Taking? Authorizing Provider  acetaminophen (TYLENOL) 650 MG CR tablet Take 1,000 mg by mouth every 8 (eight) hours as needed for pain.   Yes [provider]  azithromycin (ZITHROMAX Z-PAK) 250 MG tablet Take 2 tablets (500 mg) on  Day 1,  followed by 1  tablet (250 mg) once daily on Days 2 through 5. 09/07/21 09/12/21 Yes Chesley Noon, MD  bisoprolol-hydrochlorothiazide [facility]) 5-6.25 MG tablet Take 0.5 tablets by mouth every evening.    Yes [provider]  cefpodoxime (VANTIN) 200 MG tablet Take 1 tablet (200 mg total) by mouth 2 (two) times daily for 7 days. 09/07/21 09/14/21 Yes Chesley Noon, MD  citalopram (CELEXA) 20 MG tablet Take 20 mg by mouth daily.    Yes [provider]  cyanocobalamin (,VITAMIN B-12,) 1000 MCG/ML injection Inject 1,000 mcg into the muscle every 30 (thirty) days.   Yes [provider]  diphenhydrAMINE-APAP, sleep, (GOODYS PM PO) Take 1 packet by mouth at bedtime.  Yes [provider]  levothyroxine (SYNTHROID) 75 MCG tablet Take 75 mcg by mouth daily before breakfast.   Yes [provider]  loratadine (CLARITIN) 10 MG tablet Take 10 mg by mouth daily.   Yes [provider]  losartan (COZAAR) 50 MG tablet Take 50 mg by mouth daily. am   Yes [provider]  Multiple Vitamin (MULTIVITAMIN WITH MINERALS) TABS tablet Take 1 tablet by mouth daily.   Yes [provider]  Multiple Vitamins-Minerals (PRESERVISION AREDS 2 PO) Take 1 capsule by mouth 2 (two) times daily.   Yes [provider]  ondansetron (ZOFRAN ODT) 4 MG disintegrating tablet Take 1 tablet (4 mg total) by mouth every 8 (eight) hours as needed for nausea or vomiting. 09/07/21  Yes Chesley Noon, MD  polyethylene glycol (MIRALAX / GLYCOLAX) packet Take 17 g by mouth daily as needed for mild constipation.    Yes [provider]  Simethicone (PHAZYME MAXIMUM STRENGTH) 250 MG CAPS Take 250-500 mg by mouth daily as needed (gas).   Yes [provider]  aspirin EC 81 MG tablet Take 81 mg by mouth daily. Patient not taking: No sig reported    [provider]  famotidine (PEPCID) 20 MG tablet Take 20 mg by mouth 2 (two) times daily. Patient not taking: No sig  reported    [provider]  guaiFENesin (MUCINEX) 600 MG 12 hr tablet Take 600 mg by mouth 2 (two) times daily as needed for cough or to loosen phlegm. Patient not taking: Reported on 09/09/2021    [provider]  levothyroxine (SYNTHROID) 88 MCG tablet Take 1 tablet by mouth daily. Patient not taking: Reported on 09/09/2021 12/29/20   [provider]  pantoprazole (PROTONIX) 40 MG tablet Take 1 tablet by mouth daily. Patient not taking: Reported on 09/09/2021 07/04/21   [provider]  Polyethyl Glycol-Propyl Glycol (SYSTANE OP) Place 1 drop into both eyes daily as needed (dry eyes).     [provider]    Physical Exam: Vitals:   09/09/21 0430 09/09/21 0500 09/09/21 0730 09/09/21 0930  BP: (!) 158/76 (!) 149/74 (!) 165/76 (!) 156/77  Pulse: (!) 57 (!) 59 60 62  Resp: 18 (!) 24 16 (!) 22  Temp:      TempSrc:      SpO2: 95% 94% 96% 95%  Weight:      Height:         Vitals:   09/09/21 0430 09/09/21 0500 09/09/21 0730 09/09/21 0930  BP: (!) 158/76 (!) 149/74 (!) 165/76 (!) 156/77  Pulse: (!) 57 (!) 59 60 62  Resp: 18 (!) 24 16 (!) 22  Temp:      TempSrc:      SpO2: 95% 94% 96% 95%  Weight:      Height:          Constitutional: Alert and oriented x 3 . Not in any apparent distress. Appears weak  HEENT:      Head: Normocephalic and atraumatic.         Eyes: PERLA, EOMI, Conjunctivae are normal. Sclera is non-icteric.  Right pupil is medially rotated (Chronic)      Mouth/Throat: Mucous membranes are dry.       Neck: Supple with no signs of meningismus. Cardiovascular: Regular rate and rhythm. No murmurs, gallops, or rubs. 2+ symmetrical distal pulses are present . No JVD. No LE edema Respiratory: Respiratory effort normal .Lungs sounds clear bilaterally. No wheezes, crackles, or rhonchi.  Gastrointestinal: Soft, non tender, and non distended with positive bowel sounds.  Genitourinary: No CVA tenderness. Musculoskeletal: Nontender  with normal range of motion in all extremities. No cyanosis, or erythema of extremities. Neurologic:  Face is symmetric. Moving all extremities. No gross focal neurologic deficits .  Generalized weakness Skin: Skin is warm, dry.  Ecchymosis on both ankles as well as the left forearm (POA) Psychiatric: Mood and affect are normal    Labs on Admission: I have personally reviewed following labs and imaging studies  CBC: Recent Labs  Lab 09/06/21 2233 09/07/21 0808 09/09/21 0517  WBC 8.5 7.3 10.0  NEUTROABS 5.8  --  7.3  HGB 15.4* 15.0 14.1  HCT 42.0 40.3 RESULTS UNAVAILABLE DUE TO INTERFERING SUBSTANCE  MCV 98.4 96.2 RESULTS UNAVAILABLE DUE TO INTERFERING SUBSTANCE  PLT 190 186 201   Basic Metabolic Panel: Recent Labs  Lab 09/06/21 2233 09/07/21 0808 09/09/21 0517  NA 129* 130* 121*  K 4.0 3.8 3.0*  CL 94* 90* 85*  CO2 24 25 29   GLUCOSE 135* 151* 107*  BUN 10 9 14   CREATININE 0.79 0.61 0.63  CALCIUM 9.2 9.2 8.4*  MG  --   --  1.7   GFR: Estimated Creatinine Clearance: 36 mL/min (by C-G formula based on SCr of 0.63 mg/dL). Liver Function Tests: Recent Labs  Lab 09/06/21 2233 09/07/21 0808 09/09/21 0517  AST 26 33 47*  ALT 15 14 23   ALKPHOS 55 49 36*  BILITOT 1.0 1.1 1.1  PROT 7.7 7.2 6.2*  ALBUMIN 4.0 4.0 3.4*   Recent Labs  Lab 09/07/21 0808  LIPASE 30   Recent Labs  Lab 09/09/21 0212  AMMONIA 10   Coagulation Profile: No results for input(s): INR, PROTIME in the last 168 hours. Cardiac Enzymes: No results for input(s): CKTOTAL, CKMB, CKMBINDEX, TROPONINI in the last 168 hours. BNP (last 3 results) No results for input(s): PROBNP in the last 8760 hours. HbA1C: No results for input(s): HGBA1C in the last 72 hours. CBG: No results for input(s): GLUCAP in the last 168 hours. Lipid Profile: No results for input(s): CHOL, HDL, LDLCALC, TRIG, CHOLHDL, LDLDIRECT in the last 72 hours. Thyroid Function Tests: Recent Labs    09/09/21 0517  TSH 2.114    Anemia Panel: No results for input(s): VITAMINB12, FOLATE, FERRITIN, TIBC, IRON, RETICCTPCT in the last 72 hours. Urine analysis:    Component Value Date/Time   COLORURINE YELLOW 09/09/2021 0344   APPEARANCEUR CLEAR 09/09/2021 0344   APPEARANCEUR CLEAR 10/07/2014 0958   LABSPEC 1.015 09/09/2021 0344   LABSPEC 1.015 10/07/2014 0958   PHURINE 6.5 09/09/2021 0344   GLUCOSEU NEGATIVE 09/09/2021 0344   GLUCOSEU NEGATIVE 10/07/2014 0958   HGBUR NEGATIVE 09/09/2021 0344   BILIRUBINUR NEGATIVE 09/09/2021 0344   BILIRUBINUR NEGATIVE 10/07/2014 0958   KETONESUR TRACE (A) 09/09/2021 0344   PROTEINUR NEGATIVE 09/09/2021 0344   NITRITE NEGATIVE 09/09/2021 0344   LEUKOCYTESUR NEGATIVE 09/09/2021 0344   LEUKOCYTESUR NEGATIVE 10/07/2014 0958    Radiological Exams on Admission: CT Angio Head W or Wo Contrast  Result Date: 09/09/2021 CLINICAL DATA:  Head trauma, mod-severe Neuro deficit, acute, stroke suspected EXAM: CT ANGIOGRAPHY HEAD TECHNIQUE: Multidetector CT imaging of the head was performed using the standard protocol during bolus administration of intravenous contrast. Multiplanar CT image reconstructions and MIPs were obtained to evaluate the vascular anatomy. CONTRAST:  37mL OMNIPAQUE IOHEXOL 350 MG/ML SOLN COMPARISON:  09/07/2021 FINDINGS: CT HEAD Brain: There is no mass, hemorrhage or extra-axial collection. The size and  configuration of the ventricles and extra-axial CSF spaces are normal. There is hypoattenuation of the white matter, most commonly indicating chronic small vessel disease. Vascular: Right ICA aneurysm is unchanged from the most recent head CT, measuring 2.8 x 2.0 cm. Skull: The visualized skull base, calvarium and extracranial soft tissues are normal. Sinuses/Orbits: No fluid levels or advanced mucosal thickening of the visualized paranasal sinuses. No mastoid or middle ear effusion. The orbits are normal. CTA HEAD POSTERIOR CIRCULATION: --Vertebral arteries: Normal  --Inferior cerebellar arteries: Normal. --Basilar artery: Normal. --Superior cerebellar arteries: Normal. --Posterior cerebral arteries: Normal. ANTERIOR CIRCULATION: --Intracranial internal carotid arteries: There is a 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 --Anterior cerebral arteries (ACA): Normal. --Middle cerebral arteries (MCA): Normal. ANATOMIC VARIANTS: Fetal origin of the right PCA. Review of the MIP images confirms the above findings. IMPRESSION: 1. Partially thrombosed right ICA aneurysm with filling portion measuring up to 1.5 cm. 2. No intracranial arterial occlusion or high-grade stenosis. Electronically Signed   By: Deatra Robinson M.D.   On: 09/09/2021 03:11   DG Chest 2 View  Result Date: 09/07/2021 CLINICAL DATA:  Cough, headaches, vomiting EXAM: CHEST - 2 VIEW COMPARISON:  Chest radiograph 12/19/2017 FINDINGS: The cardiomediastinal silhouette is within normal limits. There are faint patchy opacities projecting over the right lower lobe on the frontal projection. There is no consolidative opacity. There is no pulmonary edema. There is no pleural effusion or pneumothorax. There is a large hiatal hernia, unchanged. There is multilevel degenerative change of the spine. There is no acute osseous abnormality. IMPRESSION: 1. Patchy opacities projecting over the right lower lobe could reflect infection in the correct clinical setting. 2. Unchanged large hiatal hernia. Electronically Signed   By: Lesia Hausen M.D.   On: 09/07/2021 11:33   CT Head Wo Contrast  Result Date: 09/07/2021 CLINICAL DATA:  Headache, intracranial hemorrhage suspected EXAM: CT HEAD WITHOUT CONTRAST TECHNIQUE: Contiguous axial images were obtained from the base of the skull through the vertex without intravenous contrast. COMPARISON:  11/19/2014, MRI 04/02/2017 FINDINGS: Brain: 3.1 x 2.2 cm slightly hyperdense mass  seen adjacent to the medial right temporal lobe in the medial middle cranial fossa along the right side of the sella turcica. This was seen on prior MRI and felt to represent right ICA aneurysm. This is much larger than prior study. Mild chronic small vessel disease throughout the deep white matter. No hydrocephalus, acute infarct or hemorrhage. Vascular: No hyperdense vessel. Skull: No acute calvarial abnormality. Sinuses/Orbits: Visualized paranasal sinuses and mastoids clear. Orbital soft tissues unremarkable. Other: None IMPRESSION: 3.1 x 2.2 cm giant right ICA aneurysm, increasing from 1.4 cm in 2018. No evidence of hemorrhage. Chronic small vessel disease. Electronically Signed   By: Charlett Nose M.D.   On: 09/07/2021 11:04   CT Cervical Spine Wo Contrast  Result Date: 09/09/2021 CLINICAL DATA:  Fall EXAM: CT CERVICAL SPINE WITHOUT CONTRAST TECHNIQUE: Multidetector CT imaging of the cervical spine was performed without intravenous contrast. Multiplanar CT image reconstructions were also generated. COMPARISON:  None. FINDINGS: Alignment: No static subluxation. Facets are aligned. Occipital condyles and the lateral masses of C1 and C2 are normally approximated. Skull base and vertebrae: No acute fracture. Soft tissues and spinal canal: No prevertebral fluid or swelling. No visible canal hematoma. Disc levels: No advanced spinal canal or neural foraminal stenosis. Upper chest: Biapical emphysema. Other: Normal visualized paraspinal cervical soft tissues. IMPRESSION: 1.  No acute fracture or static subluxation of the cervical spine. Emphysema (ICD10-J43.9). Electronically Signed   By: Deatra Robinson M.D.   On: 09/09/2021 03:14   CT Abdomen Pelvis W Contrast  Result Date: 09/07/2021 CLINICAL DATA:  Headaches crash that nausea, vomiting. Abdominal pain. EXAM: CT ABDOMEN AND PELVIS WITH CONTRAST TECHNIQUE: Multidetector CT imaging of the abdomen and pelvis was performed using the standard protocol following  bolus administration of intravenous contrast. CONTRAST:  80mL OMNIPAQUE IOHEXOL 350 MG/ML SOLN COMPARISON:  09/30/2018 FINDINGS: Lower chest: Large hiatal hernia, stable.  No acute abnormality. Hepatobiliary: Small layering gallstones within the gallbladder, stable. Small benign calcification in the right hepatic lobe, stable. No suspicious focal hepatic abnormality or biliary ductal dilatation. Pancreas: No focal abnormality or ductal dilatation. Spleen: No focal abnormality.  Normal size. Adrenals/Urinary Tract: No adrenal abnormality. No focal renal abnormality. No stones or hydronephrosis. Urinary bladder is unremarkable. Stomach/Bowel: Stomach, large and small bowel grossly unremarkable. Vascular/Lymphatic: Calcified aorta.  Anterior vascular Reproductive: Uterus and adnexa unremarkable.  No mass. Other: No free fluid or free air. Musculoskeletal: No acute bony abnormality. Degenerative changes and scoliosis in the lumbar spine. IMPRESSION: Large hiatal hernia. Cholelithiasis. Aortic atherosclerosis. No acute findings. Electronically Signed   By: Charlett Nose M.D.   On: 09/07/2021 10:55   DG Chest Portable 1 View  Result Date: 09/09/2021 CLINICAL DATA:  Seizure with unresponsiveness. EXAM: PORTABLE CHEST 1 VIEW COMPARISON:  September 07, 2021 FINDINGS: Stable, diffuse, chronic appearing increased lung markings are seen. There is no evidence of acute infiltrate, pleural effusion or pneumothorax. There is a large hiatal hernia. The heart size and mediastinal contours are within normal limits. The visualized skeletal structures are unremarkable. IMPRESSION: 1. No acute cardiopulmonary disease. 2. Large hiatal hernia. Electronically Signed   By: Aram Candela M.D.   On: 09/09/2021 03:33     Assessment/Plan Principal Problem:   Hyponatremia Active Problems:   Acquired hypothyroidism   Benign essential hypertension   Macular degeneration   Brain aneurysm   COPD (chronic obstructive pulmonary  disease) (HCC)   Depression   AMS (altered mental status)   Hiatal hernia with GERD   Hypokalemia    Patient is an 87 year old female who presents to the ER via EMS for evaluation of an unwitnessed fall and jerking movements concerning for seizure.  Labs show sodium of 121 compared to 132 days prior to her admission.    Acute hyponatremia with generalized weakness Appears to be multifactorial and secondary to poor oral intake related to nausea and vomiting with concomitant use of hydrochlorothiazide and SSRI Will start patient on normal saline Hold HCTZ and citalopram for now Obtain urine sodium, serum and urine osmolality We will request nephrology consult Place patient on fall precautions PT evaluation once improved   Altered mental status Transient Patient was said to be unresponsive for about 5 minutes after her fall with jerking movements concerning for seizure She does not have any history of seizures and there is no documentation of urine or fecal incontinence or any tongue bite ??  Seizure related to hyponatremia Will monitor patient closely Consult neurology     Hypokalemia Secondary to diuretic use Supplement potassium     History of brain aneurysm Patient had a CT angiogram of the head/neck which shows partially thrombosed right ICA aneurysm with filling portion measuring up to 1.5 cm Discussed with neurosurgery who recommends outpatient follow-up.    Hypothyroidism Stable Continue Synthroid    Hiatal hernia with GERD Patient with a  2-day history of nausea and vomiting which has improved Start patient on oral Protonix Place patient on a clear liquid diet and advance as tolerated     Hypertension Discontinue HCTZ Continue low-dose bisoprolol     DVT prophylaxis: SCD  Code Status: full code  Family Communication: Greater than 50% of time was spent discussing patient's condition and plan of care with her at the bedside.  All questions and  concerns have been addressed.  She verbalizes understanding and agrees with the plan.  CODE STATUS was discussed and she is a full code.  She lists her son Consuela Mimes as her healthcare power of attorney. Disposition Plan: Back to previous home environment Consults called: Neurology Status:At the time of admission, it appears that the appropriate admission status for this patient is inpatient. This is judged to be reasonable and necessary to provide the required intensity of service to ensure the patient's safety given the presenting symptoms, physical exam findings, and initial radiographic and laboratory data in the context of their comorbid conditions. Patient requires inpatient status due to high intensity of service, high risk for further deterioration and high frequency of surveillance required.     Lucile Shutters MD [facility]     09/09/2021, 10:10 AM

## 2021-09-10 DIAGNOSIS — I729 Aneurysm of unspecified site: Secondary | ICD-10-CM

## 2021-09-10 DIAGNOSIS — I671 Cerebral aneurysm, nonruptured: Secondary | ICD-10-CM | POA: Diagnosis not present

## 2021-09-10 DIAGNOSIS — I10 Essential (primary) hypertension: Secondary | ICD-10-CM

## 2021-09-10 DIAGNOSIS — R531 Weakness: Secondary | ICD-10-CM

## 2021-09-10 DIAGNOSIS — G4459 Other complicated headache syndrome: Secondary | ICD-10-CM

## 2021-09-10 DIAGNOSIS — E871 Hypo-osmolality and hyponatremia: Secondary | ICD-10-CM | POA: Diagnosis not present

## 2021-09-10 DIAGNOSIS — R519 Headache, unspecified: Secondary | ICD-10-CM | POA: Diagnosis present

## 2021-09-10 DIAGNOSIS — R55 Syncope and collapse: Secondary | ICD-10-CM | POA: Diagnosis not present

## 2021-09-10 DIAGNOSIS — G9341 Metabolic encephalopathy: Secondary | ICD-10-CM | POA: Diagnosis not present

## 2021-09-10 DIAGNOSIS — E039 Hypothyroidism, unspecified: Secondary | ICD-10-CM

## 2021-09-10 LAB — BASIC METABOLIC PANEL
Anion gap: 7 (ref 5–15)
BUN: 8 mg/dL (ref 8–23)
CO2: 28 mmol/L (ref 22–32)
Calcium: 8.5 mg/dL — ABNORMAL LOW (ref 8.9–10.3)
Chloride: 97 mmol/L — ABNORMAL LOW (ref 98–111)
Creatinine, Ser: 0.66 mg/dL (ref 0.44–1.00)
GFR, Estimated: >60 mL/min (ref >60)
Glucose, Bld: 95 mg/dL (ref 70–99)
Potassium: 4.4 mmol/L (ref 3.5–5.1)
Sodium: 132 mmol/L — ABNORMAL LOW (ref 135–145)

## 2021-09-10 LAB — CBC
HCT: 38.3 % (ref 36.0–46.0)
Hemoglobin: 14.1 g/dL (ref 12.0–15.0)
MCH: 35.5 pg — ABNORMAL HIGH (ref 26.0–34.0)
MCHC: 36.8 g/dL — ABNORMAL HIGH (ref 30.0–36.0)
MCV: 96.5 fL (ref 80.0–100.0)
Platelets: 179 10*3/uL (ref 150–400)
RBC: 3.97 MIL/uL (ref 3.87–5.11)
RDW: 12.5 % (ref 11.5–15.5)
WBC: 9.3 10*3/uL (ref 4.0–10.5)
nRBC: 0 % (ref 0.0–0.2)

## 2021-09-10 LAB — SODIUM: Sodium: 129 mmol/L — ABNORMAL LOW (ref 135–145)

## 2021-09-10 MED ORDER — BISOPROLOL FUMARATE 5 MG PO TABS
5.0000 mg | ORAL_TABLET | Freq: Every day | ORAL | Status: DC
  Administered 2021-09-11 – 2021-09-12 (×3): 5 mg via ORAL
  Filled 2021-09-10 (×6): qty 1

## 2021-09-10 MED ORDER — BISACODYL 10 MG RE SUPP
10.0000 mg | Freq: Once | RECTAL | Status: AC
  Administered 2021-09-10: 10 mg via RECTAL
  Filled 2021-09-10 (×2): qty 1

## 2021-09-10 MED ORDER — DIPHENHYDRAMINE HCL 12.5 MG/5ML PO ELIX
12.5000 mg | ORAL_SOLUTION | Freq: Every evening | ORAL | Status: DC | PRN
  Filled 2021-09-10: qty 5

## 2021-09-10 MED ORDER — LOSARTAN POTASSIUM 50 MG PO TABS
50.0000 mg | ORAL_TABLET | Freq: Every day | ORAL | Status: DC
  Administered 2021-09-10 – 2021-09-14 (×4): 50 mg via ORAL
  Filled 2021-09-10 (×5): qty 1

## 2021-09-10 MED ORDER — DIVALPROEX SODIUM 125 MG PO DR TAB
125.0000 mg | DELAYED_RELEASE_TABLET | Freq: Two times a day (BID) | ORAL | Status: DC
  Administered 2021-09-10 – 2021-09-14 (×9): 125 mg via ORAL
  Filled 2021-09-10 (×11): qty 1

## 2021-09-10 NOTE — Progress Notes (Signed)
Patient ID: Briana Lozano, female   DOB: 1929-04-30, 87 y.o.   MRN: [id number] [facility] PROGRESS NOTE  Briana Lozano OEV:[id number] DOB: 1929/03/11 DOA: 09/09/2021 PCP: Dorothey Baseman, MD  HPI/Subjective: Just received her Depakote I was started by neurology to try to prevent headache.  Patient not having headache now but did have one this morning.  She has been having headache now for a few days.  She did have nausea vomiting and dry heaving prior to coming into the hospital for 2 days.  Her blood pressure was very elevated.  She also complains of some constipation.  Patient found to have a right ICA aneurysm unchanged measuring 2.8 x 2.0 cm.  She cannot see out of the right eye and its deviated and going on for a while now.  Objective: Vitals:   09/10/21 0945 09/10/21 1000  BP: (!) 149/85 (!) 147/83  Pulse: 72 72  Resp: 15   Temp:    SpO2: 94% 93%    Intake/Output Summary (Last 24 hours) at 09/10/2021 1400 Last data filed at 09/09/2021 1608 Gross per 24 hour  Intake --  Output 300 ml  Net -300 ml   Filed Weights   09/09/21 0208  Weight: 55 kg    ROS: Review of Systems  Respiratory:  Negative for shortness of breath.   Cardiovascular:  Negative for chest pain.  Gastrointestinal:  Positive for constipation. Negative for abdominal pain, nausea and vomiting.  Exam: Physical Exam HENT:     Head: Normocephalic.     Mouth/Throat:     Pharynx: No oropharyngeal exudate.  Eyes:     General: Lids are normal.     Conjunctiva/sclera: Conjunctivae normal.     Comments: Right eye deviated and and irregular pupil.  Cardiovascular:     Rate and Rhythm: Normal rate and regular rhythm.     Heart sounds: Normal heart sounds, S1 normal and S2 normal.  Pulmonary:     Breath sounds: Normal breath sounds. No decreased breath sounds, wheezing, rhonchi or rales.  Abdominal:     Palpations: Abdomen is soft.     Tenderness: There is no abdominal tenderness.   Musculoskeletal:     Right lower leg: No swelling.     Left lower leg: No swelling.  Skin:    General: Skin is warm.     Findings: No rash.  Neurological:     Mental Status: She is alert and oriented to person, place, and time.     Comments: Able to straight leg raise bilaterally without a problem.      Scheduled Meds:  bisoprolol  5 mg Oral Daily   divalproex  125 mg Oral Q12H   levothyroxine  75 mcg Oral QAC breakfast   loratadine  10 mg Oral Daily   losartan  50 mg Oral Daily   multivitamin-lutein  1 capsule Oral Daily   pantoprazole  40 mg Oral Daily     Assessment/Plan:  Hyponatremia and generalized weakness.  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  Hold hydrochlorothiazide and Celexa.  Sodium improved into the normal range with IV fluids.  I discontinued IV fluids this morning.  Continue to watch closely.  Sodium as low as 121 on presentation up to 132. Severe headache and acute metabolic encephalopathy.  Mental status improved.  Acute metabolic encephalopathy could be secondary to low sodium.  Neurology recommended starting Depakote for headache.  Neurology does not think that this is a seizure. ICA aneurysm.  Seen by  neurology and neurosurgery and recommended outpatient follow-up and no surgical intervention at this point. Essential hypertension.  Discontinued Cleviprex drip this morning.  Discontinued hydrochlorothiazide secondary to hyponatremia.  Restarted bisoprolol and losartan. Hypothyroidism unspecified on levothyroxine GERD on PPI Generalized weakness.  Physical therapy evaluation.  Patient normally walks with a cane and felt a little wobbly walking back to the bathroom.        Code Status:     Code Status Orders  (From admission, onward)           Start     Ordered   09/09/21 0959  Full code  Continuous        09/09/21 0959           Code Status History     Date Active Date Inactive Code Status Order ID Comments User Context    04/17/2017 1320 04/18/2017 0312 Full Code [id number]  Lisbeth Renshaw, MD [facility]      Advance Directive Documentation    Flowsheet Row Most Recent Value  Type of Advance Directive Healthcare Power of Attorney  Pre-existing out of facility DNR order (yellow form or pink MOST form) --  "MOST" Form in Place? --      Family Communication: Spoke with son on the phone Disposition Plan: Status is: Inpatient  Dispo: The patient is from: Home              Anticipated d/c is to: Home potentially tomorrow              Patient currently off Cleviprex drip and now on oral medications.  Sodium better now than on presentation.   Difficult to place patient.  No.  Consultants: Neurosurgery Neurology  Time spent: 27 minutes  Briana Lozano [employer]

## 2021-09-10 NOTE — Progress Notes (Signed)
Neurology Progress Note  S: Patient states today that she is feeling better, and that the more she thinks about it she gets these headaches quite frequently, although it was more severe this week than usual. Dr. Cook [facility] and myself met with patient and her husband at bedside this AM. She is high risk candidate for definitive treatment of the aneurysm, and while it is not emergent, we agreed that she should at least have an outpatient consultation with neurovascular surgery to have a full assessment and discussion of the risks/benefits. I will arrange follow up with Dr. Nundkumar whom she saw in 2018. Dr. Cook feels that she should be able to be discharged once we get her headaches under better control and I agree. She will need a daily preventive bc the headaches are symptomatic from the aneurysm and will not go away on their own.  O:  Vitals:   09/10/21 0930 09/10/21 0945  BP: 129/62 (!) 149/85  Pulse: 64 72  Resp:  15  Temp:    SpO2: 93% 94%   Physical Exam Gen: A&O x4, visibly uncomfortable and complaining of head pain HEENT: Atraumatic, normocephalic;mucous membranes moist; oropharynx clear, tongue without atrophy or fasciculations. Neck: Supple, trachea midline. Resp: CTAB, no w/r/r CV: RRR, no m/g/r; nml S1 and S2. 2+ symmetric peripheral pulses. Abd: soft/NT/ND; nabs x 4 quad Extrem: Nml bulk; no cyanosis, clubbing, or edema.   Neuro: *MS: A&O x4. Follows multi-step commands.  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  Smile symmetric.   VIII: Hearing intact to voice   IX,X: Voice normal, palate elevates symmetrically    XI: SCM/trap 5/5 bilat   XII: Tongue  protrudes midline, no atrophy or fasciculations    *Motor:   Normal bulk.  No tremor, rigidity or bradykinesia. No pronator drift.     Strength: Dlt Bic Tri WrE WrF FgS Gr HF KnF KnE PlF DoF    Left 5 5 5 5 5 5 5 5 5 5 5 5    Right 5 5 5 5 5 5 5 5 5 5 5 5      *Sensory: Intact to light touch, pinprick, temperature vibration throughout. Symmetric. Propioception intact bilat.  No double-simultaneous extinction.  *Coordination:  Finger-to-nose, heel-to-shin, rapid alternating motions were intact. *Reflexes:  2+ and symmetric throughout without clonus; toes down-going bilat *Gait: deferred  A/P: 87 yo woman with hx COPD, HTN, sleep apnea, vertigo admitted for syncope and severe headache favored to be symptomatic 2/2 known giant R ICA aneurysm.  - Regarding headache prophylaxis, I recommend low-dose depakote given her age, low interactions with other medications, mood benefit, and overall tolerability. Topiramate is another option in this age group but is contraindicated in her 2/2 her baseline hyponatremia. Will start 125mg DR bid which may be uptitrated slowly as needed/toelrated by her outpatient neurologist.  - I will make referrals to Dr. Nundkumar [facility] (previously seen by her in 2018) and also a neurologist at [facility] who can manage her headaches. - OK to discharge patient later this afternoon if she tolerates first dose of depakote. If her headache is   still not improved (VPA can take some time to take effect esp at low doses) a dose of metoclopramide would be reasonable.  I spent >35 min on this visit >50% of which was spent at bedside coordinating care with patient, family, and neurosurgeon.  Neurology will not continue to actively follow but please re-engage if additional neurologic concerns arise.  Colleen Stack, MD [facility] [phone]  If 7pm- 7am, please page neurology on call as listed in AMION.   admitted for worst headache of her life + syncope in the setting of  known giant R ICA aneurysm and hyponatremia. The event last night sounds more concerning for convulsive syncope than for seizure, although the acute hyponatremia would put her at risk for seizure and it remains on the differential. I am concerned about the headache 10/10, maximal at onset, woke her up from sleep, persistent x5 days and unlike any headache she has ever had before. Particularly in the setting of her known aneurysm we need to r/o SAH. Imaging cannot rule out SAH >6 hrs after sx onset so she will undergo LP. Plan d/c Dr. Agbata and Dr. Kat de Macedo Rodriguez of Neuro IR. 

## 2021-09-10 NOTE — Progress Notes (Signed)
Briana Lozano  MRN: [id number]  DOB/AGE: 1929-02-15 87 y.o.  Primary Care Physician:Bronstein, Onalee Hua, MD  Admit date: 09/09/2021  Chief Complaint:  Chief Complaint  Patient presents with   Altered Mental Status    S-Pt presented on  09/09/2021 with  Chief Complaint  Patient presents with   Altered Mental Status  . Patient was seen today in the ED Patient resting comfortably in the bed Patient offers no new specific physical complaints  Medications  bisacodyl  10 mg Rectal Once   bisoprolol  2.5 mg Oral Daily   levothyroxine  75 mcg Oral QAC breakfast   loratadine  10 mg Oral Daily   multivitamin-lutein  1 capsule Oral Daily   pantoprazole  40 mg Oral Daily         DXI:PJASN from the symptoms mentioned above,there are no other symptoms referable to all systems reviewed.  Physical Exam: Vital signs in last 24 hours: Temp:  [98.2 F (36.8 C)] 98.2 F (36.8 C) (09/30 1550) Pulse Rate:  [55-78] 72 (10/01 0945) Resp:  [14-24] 15 (10/01 0945) BP: (100-181)/(44-108) 149/85 (10/01 0945) SpO2:  [90 %-100 %] 94 % (10/01 0945) Weight change:     Intake/Output from previous day: 09/30 0701 - 10/01 0700 In: 675.6 [I.V.:175.6; IV Piggyback:500] Out: 300 [Urine:300] No intake/output data recorded.   Physical Exam:  General- pt is awake,alert, oriented to time place and person  Resp- No acute REsp distress, CTA B/L NO Rhonchi  CVS- S1S2 regular in rate and rhythm  GIT- BS+, soft, Non tender , Non distended  EXT- No LE Edema,  No Cyanosis   Lab Results:  CBC  Recent Labs    09/09/21 0517 09/10/21 0632  WBC 10.0 9.3  HGB 14.1 14.1  HCT RESULTS UNAVAILABLE DUE TO INTERFERING SUBSTANCE 38.3  PLT 201 179    BMET  Recent Labs    09/09/21 2004 09/10/21 0118 09/10/21 0632  NA 125* 129* 132*  K 3.7  --  4.4  CL 93*  --  97*  CO2 26  --  28  GLUCOSE 104*  --  95  BUN 10  --  8  CREATININE 0.74  --  0.66  CALCIUM 8.5*  --  8.5*       Most recent Creatinine trend  Lab Results  Component Value Date   CREATININE 0.66 09/10/2021   CREATININE 0.74 09/09/2021   CREATININE 0.63 09/09/2021      MICRO   Recent Results (from the past 240 hour(s))  Resp Panel by RT-PCR (Flu A&B, Covid) Nasopharyngeal Swab     Status: None   Collection Time: 09/07/21 10:45 AM   Specimen: Nasopharyngeal Swab; Nasopharyngeal(NP) swabs in vial transport medium  Result Value Ref Range Status   SARS Coronavirus 2 by RT PCR NEGATIVE NEGATIVE Final    Comment: (NOTE) SARS-CoV-2 target nucleic acids are NOT DETECTED.  The SARS-CoV-2 RNA is generally detectable in upper respiratory specimens during the acute phase of infection. The lowest concentration of SARS-CoV-2 viral copies this assay can detect is 138 copies/mL. A negative result does not preclude SARS-Cov-2 infection and should not be used as the sole basis for treatment or other patient management decisions. A negative result may occur with  improper specimen collection/handling, submission of specimen other than nasopharyngeal swab, presence of viral mutation(s) within the areas targeted by this assay, and inadequate number of viral copies(<138 copies/mL). A negative result must be combined with clinical observations, patient history, and epidemiological information. The expected  result is Negative.  Fact Sheet for Patients:  BloggerCourse.com  Fact Sheet for Healthcare Providers:  SeriousBroker.it  This test is no t yet approved or cleared by the [place] FDA and  has been authorized for detection and/or diagnosis of SARS-CoV-2 by FDA under an Emergency Use Authorization (EUA). This EUA will remain  in effect (meaning this test can be used) for the duration of the COVID-19 declaration under Section 564(b)(1) of the Act, 21 U.S.C.section 360bbb-3(b)(1), unless the authorization is terminated  or revoked sooner.        Influenza A by PCR NEGATIVE NEGATIVE Final   Influenza B by PCR NEGATIVE NEGATIVE Final    Comment: (NOTE) The Xpert Xpress SARS-CoV-2/FLU/RSV plus assay is intended as an aid in the diagnosis of influenza from Nasopharyngeal swab specimens and should not be used as a sole basis for treatment. Nasal washings and aspirates are unacceptable for Xpert Xpress SARS-CoV-2/FLU/RSV testing.  Fact Sheet for Patients: BloggerCourse.com  Fact Sheet for Healthcare Providers: SeriousBroker.it  This test is not yet approved or cleared by the [place] FDA and has been authorized for detection and/or diagnosis of SARS-CoV-2 by FDA under an Emergency Use Authorization (EUA). This EUA will remain in effect (meaning this test can be used) for the duration of the COVID-19 declaration under Section 564(b)(1) of the Act, 21 U.S.C. section 360bbb-3(b)(1), unless the authorization is terminated or revoked.  Performed at [facility], [address] [place], [place] [address]   Culture, blood (Routine X 2) w Reflex to ID Panel     Status: None (Preliminary result)   Collection Time: 09/09/21  2:12 AM   Specimen: BLOOD  Result Value Ref Range Status   Specimen Description BLOOD LEFT ASSIST CONTROL  Final   Special Requests   Final    BOTTLES DRAWN AEROBIC AND ANAEROBIC Blood Culture adequate volume   Culture   Final    NO GROWTH 1 DAY Performed at [facility], [address], [place], [place] [address]    Report Status PENDING  Incomplete  Culture, blood (Routine X 2) w Reflex to ID Panel     Status: None (Preliminary result)   Collection Time: 09/09/21  2:12 AM   Specimen: BLOOD  Result Value Ref Range Status   Specimen Description BLOOD RIGHT ASSIST CONTROL  Final   Special Requests   Final    BOTTLES DRAWN AEROBIC AND ANAEROBIC Blood Culture adequate volume   Culture   Final    NO GROWTH 1 DAY Performed at  [facility], [address], [place], [place] [address]    Report Status PENDING  Incomplete  Resp Panel by RT-PCR (Flu A&B, Covid) Nasopharyngeal Swab     Status: None   Collection Time: 09/09/21  2:12 AM   Specimen: Nasopharyngeal Swab; Nasopharyngeal(NP) swabs in vial transport medium  Result Value Ref Range Status   SARS Coronavirus 2 by RT PCR NEGATIVE NEGATIVE Final    Comment: (NOTE) SARS-CoV-2 target nucleic acids are NOT DETECTED.  The SARS-CoV-2 RNA is generally detectable in upper respiratory specimens during the acute phase of infection. The lowest concentration of SARS-CoV-2 viral copies this assay can detect is 138 copies/mL. A negative result does not preclude SARS-Cov-2 infection and should not be used as the sole basis for treatment or other patient management decisions. A negative result may occur with  improper specimen collection/handling, submission of specimen other than nasopharyngeal swab, presence of viral mutation(s) within the areas targeted by this  assay, and inadequate number of viral copies(<138 copies/mL). A negative result must be combined with clinical observations, patient history, and epidemiological information. The expected result is Negative.  Fact Sheet for Patients:  BloggerCourse.com  Fact Sheet for Healthcare Providers:  SeriousBroker.it  This test is no t yet approved or cleared by the [place] FDA and  has been authorized for detection and/or diagnosis of SARS-CoV-2 by FDA under an Emergency Use Authorization (EUA). This EUA will remain  in effect (meaning this test can be used) for the duration of the COVID-19 declaration under Section 564(b)(1) of the Act, 21 U.S.C.section 360bbb-3(b)(1), unless the authorization is terminated  or revoked sooner.       Influenza A by PCR NEGATIVE NEGATIVE Final   Influenza B by PCR NEGATIVE NEGATIVE Final    Comment: (NOTE) The  Xpert Xpress SARS-CoV-2/FLU/RSV plus assay is intended as an aid in the diagnosis of influenza from Nasopharyngeal swab specimens and should not be used as a sole basis for treatment. Nasal washings and aspirates are unacceptable for Xpert Xpress SARS-CoV-2/FLU/RSV testing.  Fact Sheet for Patients: BloggerCourse.com  Fact Sheet for Healthcare Providers: SeriousBroker.it  This test is not yet approved or cleared by the [place] FDA and has been authorized for detection and/or diagnosis of SARS-CoV-2 by FDA under an Emergency Use Authorization (EUA). This EUA will remain in effect (meaning this test can be used) for the duration of the COVID-19 declaration under Section 564(b)(1) of the Act, 21 U.S.C. section 360bbb-3(b)(1), unless the authorization is terminated or revoked.  Performed at [facility], [address] [place] [place], [place] [address]          Impression:  Ms. Briana Lozano is a 87 y.o.  female with medical history of GERD, COPD, and hypertension, who was admitted to [facility] on 09/09/2021 for Hyponatremia [E87.1]   1)Hyponatremia Patient had hyponatremia secondary to multiple factors Patient was hypovolemic Patient was on hydrochlorothiazide Patient responded well to the current treatment Patient's sodium has improved from 121 to now 132    2)HTN  Blood pressure is stable   3) right ICA aneurysm Patient is being followed by neurology, neurosurgery and primary team    Plan:  We will continue current treatment      Mickaela Starlin s Aleynah Rocchio 09/10/2021, 10:14 AM

## 2021-09-10 NOTE — ED Notes (Signed)
Dr Renae Gloss at pt bedside at this time. Per Dr. Renae Gloss, neuro and family need to talk and make a plan of care for pt.

## 2021-09-10 NOTE — Consult Note (Signed)
Neurosurgery-New Consultation Evaluation 09/10/2021 Briana Lozano [id number]  Identifying Statement: Briana Lozano is a 87 y.o. female from [place] [id number] with known ICA aneurysm  Physician Requesting Consultation: [facility]  History of Present Illness: Ms. Bachicha is here for evaluation of headaches and initially some confusion.  She was found to have hyponatremia but also had the known aneurysm and therefore a CT/CT of the head was performed.  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  Patient states she is dealing with some ocular type headaches that will be quite severe.  She states she gets these very frequently but this current one does feel slightly worse.  She usually takes Tylenol which helps.  She does not endorse any other new symptoms.  She has known plegia of the right eye and states she does not have any significant vision from there.  A LP was attempted but CSF was not able to be obtained.  Neurosurgery was consulted given the aneurysm finding  Past Medical History:  Past Medical History:  Diagnosis Date   Arthritis    fingers   Brain aneurysm    COPD (chronic obstructive pulmonary disease) (HCC)    wheezing occasionally   COVID-19 12/19/2019   mild symptoms   Depression    Dyspnea    with exertion   GERD (gastroesophageal reflux disease)    Headache    Heart murmur    followed by PCP   History of hiatal hernia    History of phlebitis    Hypertension    Hypothyroidism    Osteoporosis    Pneumonia    in PAST   Sleep apnea    no CPAP   Vertigo    in past   Wears dentures    full upper    Social History: Social History   Socioeconomic History   Marital status: Divorced    Spouse name: Not on file   Number of children: Not on file   Years of education: Not on file   Highest education level: Not on file  Occupational History   Not on  file  Tobacco Use   Smoking status: Never   Smokeless tobacco: Never  Vaping Use   Vaping Use: Never used  Substance and Sexual Activity   Alcohol use: No   Drug use: No   Sexual activity: Not on file  Other Topics Concern   Not on file  Social History Narrative   Not on file   Social Determinants of Health   Financial Resource Strain: Not on file  Food Insecurity: Not on file  Transportation Needs: Not on file  Physical Activity: Not on file  Stress: Not on file  Social Connections: Not on file  Intimate Partner Violence: Not on file     Family History: Family History  Problem Relation Age of Onset   Stroke Mother    Lung cancer Father    Lung cancer Brother    Breast cancer Neg Hx     Review of Systems:  Review of Systems - General ROS: Negative Psychological ROS: Negative Ophthalmic ROS: Positive for vision decline in the right eye ENT ROS: Negative Hematological and Lymphatic ROS: Negative  Endocrine ROS: Negative Respiratory ROS: Negative Cardiovascular ROS: Negative Gastrointestinal ROS: Negative Genito-Urinary ROS: Negative Musculoskeletal ROS: Negative Neurological ROS: Positive for headache Dermatological ROS: Negative  Physical Exam: BP (!) 149/85   Pulse 72   Temp  98.2 F (36.8 C)   Resp 15   Ht 5\' 1"  (1.549 m)   Wt 55 kg   SpO2 94%   BMI 22.91 kg/m  Body mass index is 22.91 kg/m. Body surface area is 1.54 meters squared. General appearance: Alert, cooperative, in no acute distress Head: Normocephalic, atraumatic Eyes: Normal, EOM intact Oropharynx: Slightly dry membranes Neck: Supple, range of motion appears full Ext: No edema in extremities  Neurologic exam:  Mental status: alertness: alert, orientation: person, place, time, affect: normal Speech: fluent and clear, names and repeats Cranial nerves:  II: Visual field full in the left eye, difficulty testing the right given the severe adduction but no peripheral vision noted in  that eye III/IV/VI: extra-ocular motions intact in the left eye, minimal movement of the right, the right is adducted at baseline V/VII:no evidence of facial droop or weakness  VIII: hearing normal XI: trapezius strength symmetric,  sternocleidomastoid strength symmetric XII: tongue strength symmetric  Motor:strength symmetric 5/5, normal muscle mass and tone in all extremities Sensory: intact to light touch in all extremities Gait: Not tested  Laboratory: Results for orders placed or performed during the hospital encounter of 09/09/21  Culture, blood (Routine X 2) w Reflex to ID Panel   Specimen: BLOOD  Result Value Ref Range   Specimen Description BLOOD LEFT ASSIST CONTROL    Special Requests      BOTTLES DRAWN AEROBIC AND ANAEROBIC Blood Culture adequate volume   Culture      NO GROWTH 1 DAY Performed at [facility], [address] [place], [place]    Report Status PENDING   Culture, blood (Routine X 2) w Reflex to ID Panel   Specimen: BLOOD  Result Value Ref Range   Specimen Description BLOOD RIGHT ASSIST CONTROL    Special Requests      BOTTLES DRAWN AEROBIC AND ANAEROBIC Blood Culture adequate volume   Culture      NO GROWTH 1 DAY Performed at [facility], [address], [place], [place]    Report Status PENDING   Resp Panel by RT-PCR (Flu A&B, Covid) Nasopharyngeal Swab   Specimen: Nasopharyngeal Swab; Nasopharyngeal(NP) swabs in vial transport medium  Result Value Ref Range   SARS Coronavirus 2 by RT PCR NEGATIVE NEGATIVE   Influenza A by PCR NEGATIVE NEGATIVE   Influenza B by PCR NEGATIVE NEGATIVE  Ammonia  Result Value Ref Range   Ammonia 10 9 - 35 umol/L  Lactic acid, plasma  Result Value Ref Range   Lactic Acid, Venous 1.6 0.5 - 1.9 mmol/L  Procalcitonin - Baseline  Result Value Ref Range   Procalcitonin <0.10 ng/mL  Urinalysis, Complete w Microscopic Urine, Clean Catch  Result Value Ref Range   Color, Urine  YELLOW YELLOW   APPearance CLEAR CLEAR   Specific Gravity, Urine 1.015 1.005 - 1.030   pH 6.5 5.0 - 8.0   Glucose, UA NEGATIVE NEGATIVE mg/dL   Hgb urine dipstick NEGATIVE NEGATIVE   Bilirubin Urine NEGATIVE NEGATIVE   Ketones, ur TRACE (A) NEGATIVE mg/dL   Protein, ur NEGATIVE NEGATIVE mg/dL   Nitrite NEGATIVE NEGATIVE   Leukocytes,Ua NEGATIVE NEGATIVE   Squamous Epithelial / LPF 0-5 0 - 5   WBC, UA 0-5 0 - 5 WBC/hpf   RBC / HPF 0-5 0 - 5 RBC/hpf   Bacteria, UA NONE SEEN NONE SEEN   Mucus PRESENT    Hyaline Casts, UA PRESENT   Urine Drug Screen, Qualitative ([facility] only)  Result Value Ref Range   Tricyclic, Ur Screen POSITIVE (A) NONE DETECTED   Amphetamines, Ur Screen NONE DETECTED NONE DETECTED   MDMA (Ecstasy)Ur Screen NONE DETECTED NONE DETECTED   Cocaine Metabolite,Ur [place] NONE DETECTED NONE DETECTED   Opiate, Ur Screen NONE DETECTED NONE DETECTED   Phencyclidine (PCP) Ur S NONE DETECTED NONE DETECTED   Cannabinoid 50 Ng, Ur [place] NONE DETECTED NONE DETECTED   Barbiturates, Ur Screen NONE DETECTED NONE DETECTED   Benzodiazepine, Ur Scrn NONE DETECTED NONE DETECTED   Methadone Scn, Ur NONE DETECTED NONE DETECTED  Ethanol  Result Value Ref Range   Alcohol, Ethyl (B) <10 <10 mg/dL  CBC with Differential/Platelet  Result Value Ref Range   WBC 10.0 4.0 - 10.5 K/uL   RBC RESULTS UNAVAILABLE DUE TO INTERFERING SUBSTANCE 3.87 - 5.11 MIL/uL   Hemoglobin 14.1 12.0 - 15.0 g/dL   HCT RESULTS UNAVAILABLE DUE TO INTERFERING SUBSTANCE 36.0 - 46.0 %   MCV RESULTS UNAVAILABLE DUE TO INTERFERING SUBSTANCE 80.0 - 100.0 fL   MCH RESULTS UNAVAILABLE DUE TO INTERFERING SUBSTANCE 26.0 - 34.0 pg   MCHC RESULTS UNAVAILABLE DUE TO INTERFERING SUBSTANCE 30.0 - 36.0 g/dL   RDW RESULTS UNAVAILABLE DUE TO INTERFERING SUBSTANCE 11.5 - 15.5 %   Platelets 201 150 - 400 K/uL   nRBC RESULTS UNAVAILABLE DUE TO INTERFERING SUBSTANCE 0.0 - 0.2 %   Neutrophils Relative % 73 %   Neutro Abs 7.3 1.7 - 7.7 K/uL    Lymphocytes Relative 18 %   Lymphs Abs 1.8 0.7 - 4.0 K/uL   Monocytes Relative 9 %   Monocytes Absolute 0.9 0.1 - 1.0 K/uL   Eosinophils Relative 0 %   Eosinophils Absolute 0.0 0.0 - 0.5 K/uL   Basophils Relative 0 %   Basophils Absolute 0.0 0.0 - 0.1 K/uL   Immature Granulocytes 0 %   Abs Immature Granulocytes 0.03 0.00 - 0.07 K/uL  Comprehensive metabolic panel  Result Value Ref Range   Sodium 121 (L) 135 - 145 mmol/L   Potassium 3.0 (L) 3.5 - 5.1 mmol/L   Chloride 85 (L) 98 - 111 mmol/L   CO2 29 22 - 32 mmol/L   Glucose, Bld 107 (H) 70 - 99 mg/dL   BUN 14 8 - 23 mg/dL   Creatinine, Ser 4.09 0.44 - 1.00 mg/dL   Calcium 8.4 (L) 8.9 - 10.3 mg/dL   Total Protein 6.2 (L) 6.5 - 8.1 g/dL   Albumin 3.4 (L) 3.5 - 5.0 g/dL   AST 47 (H) 15 - 41 U/L   ALT 23 0 - 44 U/L   Alkaline Phosphatase 36 (L) 38 - 126 U/L   Total Bilirubin 1.1 0.3 - 1.2 mg/dL   GFR, Estimated >81 >19 mL/min   Anion gap 7 5 - 15  TSH  Result Value Ref Range   TSH 2.114 0.350 - 4.500 uIU/mL  Osmolality, urine  Result Value Ref Range   Osmolality, Ur 428 300 - 900 mOsm/kg  Magnesium  Result Value Ref Range   Magnesium 1.7 1.7 - 2.4 mg/dL  Sodium, urine, random  Result Value Ref Range   Sodium, Ur 53 mmol/L  Osmolality  Result Value Ref Range   Osmolality 259 (L) 275 - 295 mOsm/kg  Basic metabolic panel  Result Value Ref Range   Sodium 132 (L) 135 - 145 mmol/L   Potassium 4.4 3.5 - 5.1 mmol/L   Chloride 97 (L) 98 - 111 mmol/L   CO2 28 22 - 32 mmol/L  Glucose, Bld 95 70 - 99 mg/dL   BUN 8 8 - 23 mg/dL   Creatinine, Ser 0.14 0.44 - 1.00 mg/dL   Calcium 8.5 (L) 8.9 - 10.3 mg/dL   GFR, Estimated >10 >30 mL/min   Anion gap 7 5 - 15  CBC  Result Value Ref Range   WBC 9.3 4.0 - 10.5 K/uL   RBC 3.97 3.87 - 5.11 MIL/uL   Hemoglobin 14.1 12.0 - 15.0 g/dL   HCT 13.1 43.8 - 88.7 %   MCV 96.5 80.0 - 100.0 fL   MCH 35.5 (H) 26.0 - 34.0 pg   MCHC 36.8 (H) 30.0 - 36.0 g/dL   RDW 57.9 72.8 - 20.6 %    Platelets 179 150 - 400 K/uL   nRBC 0.0 0.0 - 0.2 %  Basic metabolic panel  Result Value Ref Range   Sodium 125 (L) 135 - 145 mmol/L   Potassium 3.7 3.5 - 5.1 mmol/L   Chloride 93 (L) 98 - 111 mmol/L   CO2 26 22 - 32 mmol/L   Glucose, Bld 104 (H) 70 - 99 mg/dL   BUN 10 8 - 23 mg/dL   Creatinine, Ser 0.15 0.44 - 1.00 mg/dL   Calcium 8.5 (L) 8.9 - 10.3 mg/dL   GFR, Estimated >61 >53 mL/min   Anion gap 6 5 - 15  Sodium  Result Value Ref Range   Sodium 129 (L) 135 - 145 mmol/L  Troponin I (High Sensitivity)  Result Value Ref Range   Troponin I (High Sensitivity) 12 <18 ng/L   I personally reviewed labs  Imaging: CTA head: Intracranial internal carotid arteries: There is a 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   Impression/Plan:  Ms. Agar is here for evaluation of ongoing headaches that appear chronic for her.  The CTA does show enlargement of the known right cavernous ICA aneurysm.  I do not see any obvious evidence of a subarachnoid hemorrhage and given the chronicity of her headaches, I do think that she is dealing with more pain from the pressure along the cavernous sinus.  This can affect the trigeminal nerve as well as the dura.  Given this, I do recommend treatment of her headaches with medication.  At this time, no neurosurgery intervention is needed but I do recommend follow-up with a vascular neurosurgeon to discuss possible interventions versus surveillance.  She was seen approximately 4 years ago for a diagnostic angiogram and no treatment was planned at that time.  Son is here and states that there was a discussion about risk versus benefit.  Given her advanced age, I do think any intervention will carry increased risk but she should discuss this with an endovascular neurosurgeon.   1.  Diagnosis: ICA aneurysm  2.  Plan -No current neurosurgical  intervention needed, patient is cleared for discharge with headache management -Recommend follow-up with endovascular neurosurgery in [place], she was established there previously

## 2021-09-10 NOTE — Evaluation (Signed)
Physical Therapy Evaluation Patient Details Name: Briana Lozano MRN: [id number] DOB: 1929-02-10 Today's Date: 09/10/2021  History of Present Illness  Briana Lozano is a 87 y.o. female with past medical history of hypertension, GERD, COPD, and arthritis who presents to the ED 08/12/2021 complaining of headache and nausea. Admitted for hyponatremia, severe headache and acute medabolic encephalopathy, and ICA aneurysm. Seen by neurology and neurosurgery and reccomend outpatien follow-up and no surgical intervention at this point.   Clinical Impression  Patient reclining in ED stretcher at start of session. Patient alert and oriented and agreeable to PT. Patient able to provide history. She expects to discharge to stay with her son and his friend. Prior to hospitalization she lived alone, used a quad cane for outdoor walking only and endorses only one fall in the last 6 months. Upon PT eval, patient was required CGA - supervision for bed mobility, transfers, and ambulation with RW up to 130 feet. Patient had some difficulty with eccentric control when sitting down to low toilet and needed some cuing to keep RW positioned safely but overall showed good safety awareness and functional ability. She does appear to have experienced a decline in functional mobility and independence but appears safe to discharge to her son's home with home health PT, a RW, and 3in1 commode. Patient would benefit from skilled physical therapy to address impairments and functional limitations (see PT Problem List below) to work towards stated goals and return to PLOF or maximal functional independence.        Recommendations for follow up therapy are one component of a multi-disciplinary discharge planning process, led by the attending physician.  Recommendations may be updated based on patient status, additional functional criteria and insurance authorization.  Follow Up Recommendations Home health PT     Equipment Recommendations  3in1 (PT);Rolling walker with 5" wheels    Recommendations for Other Services       Precautions / Restrictions Precautions Precautions: Fall      Mobility  Bed Mobility Overal bed mobility: Modified Independent             General bed mobility comments: increased time for supine <> sit and scooting up bed    Transfers Overall transfer level: Needs assistance Equipment used: Rolling walker (2 wheeled) Transfers: Sit to/from Stand Sit to Stand: Min guard         General transfer comment: Patient completed sit <> stand from/to edge of ED stretcher, chair with arms, and toilet with CGA for safety. Did lack some control upon descent to the toilet.  Ambulation/Gait Ambulation/Gait assistance: Min guard;Supervision Gait Distance (Feet): 130 Feet Assistive device: Rolling walker (2 wheeled) Gait Pattern/deviations: Decreased stride length;Trunk flexed Gait velocity: slow   General Gait Details: Patient ambulated ~ 130 feet plus 75 feet to/from bathroom and around hallways with RW and CGA - SBA. Does maintain a slightly stooped posture and ambulates slowly. Patient motivated to walk. Does favor R LE slightly (reports prior knee surgery that bothers her a bit).  Stairs            Wheelchair Mobility    Modified Rankin (Stroke Patients Only)       Balance Overall balance assessment: Needs assistance Sitting-balance support: Feet unsupported;Bilateral upper extremity supported Sitting balance-Leahy Scale: Good     Standing balance support: Bilateral upper extremity supported;During functional activity Standing balance-Leahy Scale: Fair Standing balance comment: Patient is able to stand still without UE support but needs UE support to maintain balance while walking.  Pertinent Vitals/Pain Pain Assessment: No/denies pain    Home Living Family/patient expects to be discharged to:: Private  residence Living Arrangements: Children (Son and son's friend who work) Available Help at Discharge: Family;Available PRN/intermittently Type of Home: House Home Access: Stairs to enter Entrance Stairs-Rails: Right;Left;Can reach both Entrance Stairs-Number of Steps: 2 Home Layout: One level Home Equipment: Tub bench;Cane - quad      Prior Function Level of Independence: Needs assistance   Gait / Transfers Assistance Needed: Ambulated mod I (in home with no AD, outdoors with quad cane). 1 fall in last 6 months.  ADL's / Homemaking Assistance Needed: I with ADLs, has help with some IADLs (drives some, usually someone comes with her to get groceries).  Comments: Prior to hospitalization patient lived alone but now she expects to live with her son. States she has been weaker since she had COVID19 in 2020     Hand Dominance        Extremity/Trunk Assessment   Upper Extremity Assessment Upper Extremity Assessment: Generalized weakness    Lower Extremity Assessment Lower Extremity Assessment: Generalized weakness    Cervical / Trunk Assessment Cervical / Trunk Assessment: Kyphotic  Communication   Communication: No difficulties  Cognition Arousal/Alertness: Awake/alert Behavior During Therapy: WFL for tasks assessed/performed Overall Cognitive Status: Within Functional Limits for tasks assessed                                        General Comments General comments (skin integrity, edema, etc.): Vitals remained WFL throughout session.    Exercises Other Exercises Other Exercises: educated on role of PT in acute care setting, reinforced safe use of AD. Other Exercises: pt assisted with pericare, using bathroom, practiced seated and standin balance, safe transfers   Assessment/Plan    PT Assessment Patient needs continued PT services  PT Problem List Decreased strength;Decreased knowledge of use of DME;Decreased activity tolerance;Decreased  balance;Decreased mobility       PT Treatment Interventions DME instruction;Balance training;Gait training;Neuromuscular re-education;Stair training;Functional mobility training;Therapeutic activities;Therapeutic exercise;Patient/family education    PT Goals (Current goals can be found in the Care Plan section)  Acute Rehab PT Goals Patient Stated Goal: get better PT Goal Formulation: With patient Time For Goal Achievement: 09/24/21 Potential to Achieve Goals: Good    Frequency Min 2X/week   Barriers to discharge        Co-evaluation               AM-PAC PT "6 Clicks" Mobility  Outcome Measure Help needed turning from your back to your side while in a flat bed without using bedrails?: None Help needed moving from lying on your back to sitting on the side of a flat bed without using bedrails?: None Help needed moving to and from a bed to a chair (including a wheelchair)?: A Little Help needed standing up from a chair using your arms (e.g., wheelchair or bedside chair)?: A Little Help needed to walk in hospital room?: A Little Help needed climbing 3-5 steps with a railing? : A Little 6 Click Score: 20    End of Session Equipment Utilized During Treatment: Gait belt Activity Tolerance: Patient tolerated treatment well Patient left: in bed;with call bell/phone within reach Nurse Communication: Mobility status PT Visit Diagnosis: Unsteadiness on feet (R26.81);History of falling (Z91.81);Muscle weakness (generalized) (M62.81);Difficulty in walking, not elsewhere classified (R26.2)    Time: 8657-8469 PT Time Calculation (  min) (ACUTE ONLY): 40 min   Charges:   PT Evaluation $PT Eval Low Complexity: 1 Low PT Treatments $Gait Training: 8-22 mins $Therapeutic Activity: 8-22 mins       Luretha Murphy. Ilsa Iha, PT, DPT 09/10/21, 4:46 PM

## 2021-09-11 ENCOUNTER — Inpatient Hospital Stay: Payer: Medicare Other

## 2021-09-11 DIAGNOSIS — I10 Essential (primary) hypertension: Secondary | ICD-10-CM | POA: Diagnosis not present

## 2021-09-11 DIAGNOSIS — I671 Cerebral aneurysm, nonruptured: Secondary | ICD-10-CM | POA: Diagnosis not present

## 2021-09-11 DIAGNOSIS — E039 Hypothyroidism, unspecified: Secondary | ICD-10-CM | POA: Diagnosis not present

## 2021-09-11 DIAGNOSIS — K449 Diaphragmatic hernia without obstruction or gangrene: Secondary | ICD-10-CM | POA: Diagnosis not present

## 2021-09-11 DIAGNOSIS — K219 Gastro-esophageal reflux disease without esophagitis: Secondary | ICD-10-CM

## 2021-09-11 LAB — BASIC METABOLIC PANEL
Anion gap: 11 (ref 5–15)
BUN: 8 mg/dL (ref 8–23)
CO2: 28 mmol/L (ref 22–32)
Calcium: 9.2 mg/dL (ref 8.9–10.3)
Chloride: 92 mmol/L — ABNORMAL LOW (ref 98–111)
Creatinine, Ser: 0.58 mg/dL (ref 0.44–1.00)
GFR, Estimated: >60 mL/min (ref >60)
Glucose, Bld: 94 mg/dL (ref 70–99)
Potassium: 3.4 mmol/L — ABNORMAL LOW (ref 3.5–5.1)
Sodium: 131 mmol/L — ABNORMAL LOW (ref 135–145)

## 2021-09-11 MED ORDER — AMLODIPINE BESYLATE 5 MG PO TABS
5.0000 mg | ORAL_TABLET | ORAL | Status: AC
  Administered 2021-09-11: 5 mg via ORAL
  Filled 2021-09-11: qty 1

## 2021-09-11 MED ORDER — CITALOPRAM HYDROBROMIDE 10 MG PO TABS
10.0000 mg | ORAL_TABLET | Freq: Every day | ORAL | Status: DC
  Administered 2021-09-11 – 2021-09-14 (×4): 10 mg via ORAL
  Filled 2021-09-11 (×5): qty 1

## 2021-09-11 MED ORDER — SPIRONOLACTONE 25 MG PO TABS
25.0000 mg | ORAL_TABLET | Freq: Every day | ORAL | Status: DC
  Administered 2021-09-11 – 2021-09-12 (×2): 25 mg via ORAL
  Filled 2021-09-11 (×3): qty 1

## 2021-09-11 MED ORDER — AMLODIPINE BESYLATE 5 MG PO TABS: 5.0000 mg | ORAL_TABLET | Freq: Every day | ORAL | Status: DC

## 2021-09-11 MED ORDER — NYSTATIN 100000 UNIT/ML MT SUSP
5.0000 mL | Freq: Four times a day (QID) | OROMUCOSAL | Status: DC
  Administered 2021-09-11 – 2021-09-14 (×12): 500000 [IU] via ORAL
  Filled 2021-09-11 (×13): qty 5

## 2021-09-11 MED ORDER — POTASSIUM CHLORIDE CRYS ER 20 MEQ PO TBCR
40.0000 meq | EXTENDED_RELEASE_TABLET | Freq: Once | ORAL | Status: AC
  Administered 2021-09-11: 40 meq via ORAL
  Filled 2021-09-11: qty 2

## 2021-09-11 NOTE — Progress Notes (Signed)
Patient ID: Briana Lozano, female   DOB: 08-Jun-1929, 87 y.o.   MRN: [id number] [facility] PROGRESS NOTE  Chinara Hertzberg ERX:[id number] DOB: 09-Jun-1929 DOA: 09/09/2021 PCP: Dorothey Baseman, MD  HPI/Subjective: Patient wants complains of headache this morning.  Her blood pressure is up.  Also has some right sided facial numbness.  She is concerned that she had a stroke.  Headache seems to coordinate with elevation of blood pressure.  Objective: Vitals:   09/11/21 1130 09/11/21 1438  BP: (!) 194/90 (!) 146/65  Pulse: (!) 59 (!) 59  Resp: 18 17  Temp: 97.8 F (36.6 C) (!) 97.4 F (36.3 C)  SpO2: 98% 95%    Intake/Output Summary (Last 24 hours) at 09/11/2021 1511 Last data filed at 09/11/2021 0318 Gross per 24 hour  Intake 56.92 ml  Output --  Net 56.92 ml    Filed Weights   09/09/21 0208  Weight: 55 kg    ROS: Review of Systems  Constitutional:  Positive for malaise/fatigue.  Respiratory:  Negative for shortness of breath.   Cardiovascular:  Negative for chest pain.  Gastrointestinal:  Positive for constipation. Negative for abdominal pain, nausea and vomiting.  Neurological:  Positive for sensory change.  Exam: Physical Exam HENT:     Head: Normocephalic.     Mouth/Throat:     Pharynx: No oropharyngeal exudate.  Eyes:     General: Lids are normal.     Conjunctiva/sclera: Conjunctivae normal.  Cardiovascular:     Rate and Rhythm: Normal rate and regular rhythm.     Heart sounds: Normal heart sounds, S1 normal and S2 normal.  Pulmonary:     Breath sounds: No decreased breath sounds, wheezing, rhonchi or rales.  Abdominal:     Palpations: Abdomen is soft.     Tenderness: There is no abdominal tenderness.  Musculoskeletal:     Right lower leg: No swelling.     Left lower leg: No swelling.  Skin:    General: Skin is warm.     Findings: No rash.  Neurological:     Mental Status: She is alert and oriented to person, place, and time.      Comments: Power 5 out of 5 upper and lower extremities.  Family concerned that her mouth is drawing a little bit.      Scheduled Meds:  [START ON 09/12/2021] amLODipine  5 mg Oral Daily   bisoprolol  5 mg Oral Daily   citalopram  10 mg Oral Daily   divalproex  125 mg Oral Q12H   levothyroxine  75 mcg Oral QAC breakfast   losartan  50 mg Oral Daily   multivitamin-lutein  1 capsule Oral Daily   pantoprazole  40 mg Oral Daily   spironolactone  25 mg Oral Daily     Assessment/Plan:  Accelerated hypertension seems to coordinate with her headache.  Patient on bisoprolol and losartan from yesterday.  Added Norvasc this morning and Aldactone late morning.  Blood pressure very high late morning but has come down after starting Aldactone.  Would like to watch blood pressure another day here in the hospital.  Neurology started Depakote for headache. Numbness right face.  MRI brain negative for stroke. Hyponatremia and generalized weakness.  Likely SIADH.  Holding hydrochlorothiazide.  Can restart Celexa.  I discontinued fluids yesterday.   ICA aneurysm.  Follow-up as outpatient.  Hypothyroidism unspecified on levothyroxine GERD, hiatal hernia on PPI Generalized weakness.  Continue working with physical therapy Poor appetite.  Continue to encourage  eating.      Code Status:     Code Status Orders  (From admission, onward)           Start     Ordered   09/09/21 0959  Full code  Continuous        09/09/21 0959           Code Status History     Date Active Date Inactive Code Status Order ID Comments User Context   04/17/2017 1320 04/18/2017 0312 Full Code [id number]  Lisbeth Renshaw, MD [facility]      Advance Directive Documentation    Flowsheet Row Most Recent Value  Type of Advance Directive Healthcare Power of Attorney  Pre-existing out of facility DNR order (yellow form or pink MOST form) --  "MOST" Form in Place? --      Family Communication: Spoke with son at the  bedside Disposition Plan: Status is: Inpatient  Dispo: The patient is from: Home              Anticipated d/c is to: Home potentially tomorrow              Patient currently had high blood pressures last night and again this morning.  Would like to have better control of her blood pressures prior to disposition.   Difficult to place patient.  No.  Consultants: Neurosurgery Neurology  Time spent: 28 minutes, case discussed with neurology  Charae Depaolis [facility]  [facility]

## 2021-09-11 NOTE — Progress Notes (Signed)
Briana Lozano  MRN: [id number]  DOB/AGE: 85-Sep-1930 85 y.o.  Primary Care Physician:Bronstein, Onalee Hua, MD  Admit date: 09/09/2021  Chief Complaint:  Chief Complaint  Patient presents with   Altered Mental Status    S-Pt presented on  09/09/2021 with  Chief Complaint  Patient presents with   Altered Mental Status  . Patient was seen today in the first floor. Patient resting comfortably in the bed Patient offers no new specific physical complaints  Medications  [START ON 09/12/2021] amLODipine  5 mg Oral Daily   bisoprolol  5 mg Oral Daily   citalopram  10 mg Oral Daily   divalproex  125 mg Oral Q12H   levothyroxine  75 mcg Oral QAC breakfast   losartan  50 mg Oral Daily   multivitamin-lutein  1 capsule Oral Daily   nystatin  5 mL Oral QID   pantoprazole  40 mg Oral Daily   spironolactone  25 mg Oral Daily         CHY:IFOYD from the symptoms mentioned above,there are no other symptoms referable to all systems reviewed.  Physical Exam: Vital signs in last 24 hours: Temp:  [97.4 F (36.3 C)-98.4 F (36.9 C)] 97.4 F (36.3 C) (10/02 1438) Pulse Rate:  [50-60] 59 (10/02 1814) Resp:  [16-18] 17 (10/02 1438) BP: (146-194)/(65-90) 148/67 (10/02 1814) SpO2:  [93 %-99 %] 95 % (10/02 1438) Weight change:     Intake/Output from previous day: 10/01 0701 - 10/02 0700 In: 56.9 [I.V.:56.9] Out: -  No intake/output data recorded.   Physical Exam:  General- pt is awake,alert, oriented to time place and person  Resp- No acute REsp distress, CTA B/L NO Rhonchi  CVS- S1S2 regular in rate and rhythm  GIT- BS+, soft, Non tender , Non distended  EXT- No LE Edema,  No Cyanosis   Lab Results:  CBC  Recent Labs    09/09/21 0517 09/10/21 0632  WBC 10.0 9.3  HGB 14.1 14.1  HCT RESULTS UNAVAILABLE DUE TO INTERFERING SUBSTANCE 38.3  PLT 201 179    BMET  Recent Labs    09/10/21 0632 09/11/21 0504  NA 132* 131*  K 4.4 3.4*  CL 97* 92*  CO2 28 28   GLUCOSE 95 94  BUN 8 8  CREATININE 0.66 0.58  CALCIUM 8.5* 9.2      Most recent Creatinine trend  Lab Results  Component Value Date   CREATININE 0.58 09/11/2021   CREATININE 0.66 09/10/2021   CREATININE 0.74 09/09/2021      MICRO   Recent Results (from the past 240 hour(s))  Resp Panel by RT-PCR (Flu A&B, Covid) Nasopharyngeal Swab     Status: None   Collection Time: 09/07/21 10:45 AM   Specimen: Nasopharyngeal Swab; Nasopharyngeal(NP) swabs in vial transport medium  Result Value Ref Range Status   SARS Coronavirus 2 by RT PCR NEGATIVE NEGATIVE Final    Comment: (NOTE) SARS-CoV-2 target nucleic acids are NOT DETECTED.  The SARS-CoV-2 RNA is generally detectable in upper respiratory specimens during the acute phase of infection. The lowest concentration of SARS-CoV-2 viral copies this assay can detect is 138 copies/mL. A negative result does not preclude SARS-Cov-2 infection and should not be used as the sole basis for treatment or other patient management decisions. A negative result may occur with  improper specimen collection/handling, submission of specimen other than nasopharyngeal swab, presence of viral mutation(s) within the areas targeted by this assay, and inadequate number of viral copies(<138 copies/mL). A negative result  must be combined with clinical observations, patient history, and epidemiological information. The expected result is Negative.  Fact Sheet for Patients:  BloggerCourse.com  Fact Sheet for Healthcare Providers:  SeriousBroker.it  This test is no t yet approved or cleared by the [place] FDA and  has been authorized for detection and/or diagnosis of SARS-CoV-2 by FDA under an Emergency Use Authorization (EUA). This EUA will remain  in effect (meaning this test can be used) for the duration of the COVID-19 declaration under Section 564(b)(1) of the Act, 21 U.S.C.section  360bbb-3(b)(1), unless the authorization is terminated  or revoked sooner.       Influenza A by PCR NEGATIVE NEGATIVE Final   Influenza B by PCR NEGATIVE NEGATIVE Final    Comment: (NOTE) The Xpert Xpress SARS-CoV-2/FLU/RSV plus assay is intended as an aid in the diagnosis of influenza from Nasopharyngeal swab specimens and should not be used as a sole basis for treatment. Nasal washings and aspirates are unacceptable for Xpert Xpress SARS-CoV-2/FLU/RSV testing.  Fact Sheet for Patients: BloggerCourse.com  Fact Sheet for Healthcare Providers: SeriousBroker.it  This test is not yet approved or cleared by the [place] FDA and has been authorized for detection and/or diagnosis of SARS-CoV-2 by FDA under an Emergency Use Authorization (EUA). This EUA will remain in effect (meaning this test can be used) for the duration of the COVID-19 declaration under Section 564(b)(1) of the Act, 21 U.S.C. section 360bbb-3(b)(1), unless the authorization is terminated or revoked.  Performed at [facility], [address] [place], [place] [address]   Culture, blood (Routine X 2) w Reflex to ID Panel     Status: None (Preliminary result)   Collection Time: 09/09/21  2:12 AM   Specimen: BLOOD  Result Value Ref Range Status   Specimen Description BLOOD LEFT ASSIST CONTROL  Final   Special Requests   Final    BOTTLES DRAWN AEROBIC AND ANAEROBIC Blood Culture adequate volume   Culture   Final    NO GROWTH 2 DAYS Performed at [facility], [address] [place], [place] [address]    Report Status PENDING  Incomplete  Culture, blood (Routine X 2) w Reflex to ID Panel     Status: None (Preliminary result)   Collection Time: 09/09/21  2:12 AM   Specimen: BLOOD  Result Value Ref Range Status   Specimen Description BLOOD RIGHT ASSIST CONTROL  Final   Special Requests   Final    BOTTLES DRAWN AEROBIC AND ANAEROBIC Blood  Culture adequate volume   Culture   Final    NO GROWTH 2 DAYS Performed at [facility], [address] [place], [place] [address]    Report Status PENDING  Incomplete  Resp Panel by RT-PCR (Flu A&B, Covid) Nasopharyngeal Swab     Status: None   Collection Time: 09/09/21  2:12 AM   Specimen: Nasopharyngeal Swab; Nasopharyngeal(NP) swabs in vial transport medium  Result Value Ref Range Status   SARS Coronavirus 2 by RT PCR NEGATIVE NEGATIVE Final    Comment: (NOTE) SARS-CoV-2 target nucleic acids are NOT DETECTED.  The SARS-CoV-2 RNA is generally detectable in upper respiratory specimens during the acute phase of infection. The lowest concentration of SARS-CoV-2 viral copies this assay can detect is 138 copies/mL. A negative result does not preclude SARS-Cov-2 infection and should not be used as the sole basis for treatment or other patient management decisions. A negative result may occur with  improper specimen collection/handling, submission of specimen other  than nasopharyngeal swab, presence of viral mutation(s) within the areas targeted by this assay, and inadequate number of viral copies(<138 copies/mL). A negative result must be combined with clinical observations, patient history, and epidemiological information. The expected result is Negative.  Fact Sheet for Patients:  BloggerCourse.com  Fact Sheet for Healthcare Providers:  SeriousBroker.it  This test is no t yet approved or cleared by the [place] FDA and  has been authorized for detection and/or diagnosis of SARS-CoV-2 by FDA under an Emergency Use Authorization (EUA). This EUA will remain  in effect (meaning this test can be used) for the duration of the COVID-19 declaration under Section 564(b)(1) of the Act, 21 U.S.C.section 360bbb-3(b)(1), unless the authorization is terminated  or revoked sooner.       Influenza A by PCR NEGATIVE NEGATIVE  Final   Influenza B by PCR NEGATIVE NEGATIVE Final    Comment: (NOTE) The Xpert Xpress SARS-CoV-2/FLU/RSV plus assay is intended as an aid in the diagnosis of influenza from Nasopharyngeal swab specimens and should not be used as a sole basis for treatment. Nasal washings and aspirates are unacceptable for Xpert Xpress SARS-CoV-2/FLU/RSV testing.  Fact Sheet for Patients: BloggerCourse.com  Fact Sheet for Healthcare Providers: SeriousBroker.it  This test is not yet approved or cleared by the [place] FDA and has been authorized for detection and/or diagnosis of SARS-CoV-2 by FDA under an Emergency Use Authorization (EUA). This EUA will remain in effect (meaning this test can be used) for the duration of the COVID-19 declaration under Section 564(b)(1) of the Act, 21 U.S.C. section 360bbb-3(b)(1), unless the authorization is terminated or revoked.  Performed at [facility], [address] [place] [place], [place] [address]          Impression:  Ms. Naydeen Speirs is a 87 y.o.  female with medical history of GERD, COPD, and hypertension, who was admitted to [facility] on 09/09/2021 for Hyponatremia [E87.1]   1)Hyponatremia Patient had hyponatremia secondary to multiple factors Patient was hypovolemic Patient was on hydrochlorothiazide Patient responded well to the current treatment Patient's sodium has improved from 121 to now 131--132    2)HTN  Blood pressure is stable   3) right ICA aneurysm Patient is being followed by neurology, neurosurgery and primary team   4) hypokalemia Patient is on losartan and spironolactone Patient did receive potassium as well We will follow    Plan:  We will continue current treatment Will follow patient sodium and potassium     Jovanie Verge s Nevia Henkin 09/11/2021, 6:55 PM

## 2021-09-11 NOTE — Plan of Care (Signed)

## 2021-09-12 DIAGNOSIS — R200 Anesthesia of skin: Secondary | ICD-10-CM

## 2021-09-12 DIAGNOSIS — I10 Essential (primary) hypertension: Secondary | ICD-10-CM | POA: Diagnosis not present

## 2021-09-12 DIAGNOSIS — R202 Paresthesia of skin: Secondary | ICD-10-CM

## 2021-09-12 DIAGNOSIS — E871 Hypo-osmolality and hyponatremia: Secondary | ICD-10-CM | POA: Diagnosis not present

## 2021-09-12 DIAGNOSIS — I671 Cerebral aneurysm, nonruptured: Secondary | ICD-10-CM | POA: Diagnosis not present

## 2021-09-12 DIAGNOSIS — K219 Gastro-esophageal reflux disease without esophagitis: Secondary | ICD-10-CM

## 2021-09-12 LAB — BASIC METABOLIC PANEL
Anion gap: 6 (ref 5–15)
BUN: 11 mg/dL (ref 8–23)
CO2: 28 mmol/L (ref 22–32)
Calcium: 8.7 mg/dL — ABNORMAL LOW (ref 8.9–10.3)
Chloride: 94 mmol/L — ABNORMAL LOW (ref 98–111)
Creatinine, Ser: 0.54 mg/dL (ref 0.44–1.00)
GFR, Estimated: >60 mL/min (ref >60)
Glucose, Bld: 93 mg/dL (ref 70–99)
Potassium: 3.9 mmol/L (ref 3.5–5.1)
Sodium: 128 mmol/L — ABNORMAL LOW (ref 135–145)

## 2021-09-12 MED ORDER — ENOXAPARIN SODIUM 40 MG/0.4ML IJ SOSY
40.0000 mg | PREFILLED_SYRINGE | INTRAMUSCULAR | Status: DC
  Administered 2021-09-12 – 2021-09-14 (×3): 40 mg via SUBCUTANEOUS
  Filled 2021-09-12 (×3): qty 0.4

## 2021-09-12 MED ORDER — AMLODIPINE BESYLATE 10 MG PO TABS
10.0000 mg | ORAL_TABLET | Freq: Every day | ORAL | Status: DC
  Administered 2021-09-12: 10 mg via ORAL
  Filled 2021-09-12 (×2): qty 1

## 2021-09-12 NOTE — Evaluation (Signed)
Clinical/Bedside Swallow Evaluation Patient Details  Name: Briana Lozano MRN: [id number] Date of Birth: 17-Feb-1929  Today's Date: 09/12/2021 Time: SLP Start Time (ACUTE ONLY): 1355 SLP Stop Time (ACUTE ONLY): 1450 SLP Time Calculation (min) (ACUTE ONLY): 55 min  Past Medical History:  Past Medical History:  Diagnosis Date   Arthritis    fingers   Brain aneurysm    COPD (chronic obstructive pulmonary disease) (HCC)    wheezing occasionally   COVID-19 12/19/2019   mild symptoms   Depression    Dyspnea    with exertion   GERD (gastroesophageal reflux disease)    Headache    Heart murmur    followed by PCP   History of hiatal hernia    History of phlebitis    Hypertension    Hypothyroidism    Osteoporosis    Pneumonia    in PAST   Sleep apnea    no CPAP   Vertigo    in past   Wears dentures    full upper   Past Surgical History:  Past Surgical History:  Procedure Laterality Date   APPENDECTOMY     BREAST BIOPSY     CATARACT EXTRACTION W/PHACO Left 11/18/2019   Procedure: CATARACT EXTRACTION PHACO AND INTRAOCULAR LENS PLACEMENT (IOC) LEFT 5.77, 00:41.2;  Surgeon: Galen Manila, MD;  Location: [facility];  Service: Ophthalmology;  Laterality: Left;  sleep apnea   CATARACT EXTRACTION W/PHACO Right 01/20/2020   Procedure: CATARACT EXTRACTION PHACO AND INTRAOCULAR LENS PLACEMENT (IOC) RIGHT;  Surgeon: Galen Manila, MD;  Location: [facility];  Service: Ophthalmology;  Laterality: Right;  Lot #[id number] H [place]: 07:12 CDE: 00:59.3    COLONOSCOPY  2002,2008,2015   EYE SURGERY     HEMORRHOID SURGERY  1950   IR ANGIO INTRA EXTRACRAN SEL COM CAROTID INNOMINATE BILAT MOD SED  04/17/2017   IR ANGIO VERTEBRAL SEL VERTEBRAL UNI L MOD SED  04/17/2017   REPLACEMENT TOTAL KNEE Right 04/2005   HPI:  Pt is a 87 yo woman with hx brain aneurysm (states she is f/b outside eye doctor for this but has not followed up in a while), COPD, HTN, sleep apnea, vertigo admitted after  syncopal episode upon standing up yesterday evening. Patient states that on Monday night she was awoken from sleep with a 10/10 thunderclap worst headache of her life and it has persisted since that time, only slightly improved with tylenol, is currently 9/10. She has never had a headache like this before. Then yesterday she was standing up from a seated position, felt briefly lightheaded, then had a syncopal episode. She does not rememberwhat happened next but does not think she hit her head. Her son ran into the room a few seconds later and held her in his arms. At that point she had a very fine tremor in her BUE which he demonstrated to me more as tremulousness without rhythmic jerking. There was concern for possible seizure given Na+ 121 down from 129 two days ago.  She has had poor oral intake due to nausea and vomiting for about 2 days and is also on hydrochlorothiazide      CTA demonstrated R ICA aneurysm 2.8 x 2.0 cm, partially thrombosed, with filling portion measuring up to 1.5cm.   CXR: no acute cardiopulmonary disease.  Large hiatal hernia.   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 She tells me this was related to her aneurysm and a  cataract repair. She has no other focal neurologic deficits.    Assessment / Plan / Recommendation  Clinical Impression  Pt seen today for BSE. Pt has a Baseline dx of Mod-Large Hiatal Hernia; ANY Esophageal phase dysmotility and discomfort can impact Esophageal Clearing, swallowing in general, and overall oral intake/Nutrition.  Pt appears to present w/ adequate oropharyngeal phase swallow w/ No oropharyngeal phase dysphagia noted, No neuromuscular deficits noted. Pt c/o R facial tingling/numbess but demonstrated appropriate oral control of boluses and no OM weakness. Slight-min decreased labial sensation in R corder of mouth noted. Pt consumed po trials w/ No overt, clinical s/s of aspiration  during po trials. Pt appears at reduced risk for aspiration following general aspiration precautions.   During po trials, pt consumed all consistencies w/ no overt coughing, decline in vocal quality, or change in respiratory presentation during/post trials. Oral phase appeared [facility] w/ timely bolus management, mastication, and control of bolus propulsion for A-P transfer for swallowing. Oral clearing achieved w/ all trial consistencies. Pt utilized and lingual sweep to clear any bolus residue from R corner of mouth not felt; or used her napkin to wife mouth. She c/o biting the inside of her R cheek during mastication w/ solids -- encouraged moistened foods for ease of chewing/less chewing time; chewing on one(L) side of mouth. OM Exam appeared [facility] w/ no unilateral weakness noted; slight decreased facial-labial sensation noted in R corner of mouth. Speech Clear. Pt fed self w/ setup support.   Recommend continue a Regular consistency diet w/ well-Cut meats, moistened foods; Thin liquids Via CUP - offers best oral control when sipping liquids. Recommend general aspiration precautions, Pills WHOLE in Puree for safer, easier swallowing as pt described Larger pills causing difficulty to swallow Encouraged Small, Single bites of foods an chew carefully d/t biting inside of R cheek. Education given on Pills in Puree; food consistencies and easy to eat options; general aspiration precautions; oral care -- swabs and Denture cup/cleaner given w/ pt to remove after dinner and let soak. NSG to reconsult if any new needs arise. NSG agreed. SLP Visit Diagnosis: Dysphagia, unspecified (R13.10)    Aspiration Risk   (reduced following general precautions)    Diet Recommendation   Regular consistency diet w/ well-Cut meats, moistened foods; Thin liquids Via CUP - offers best oral control when sipping liquids. Recommend general aspiration precautions, Encouraged Small, Single bites of foods an chew carefully d/t biting inside  of R cheek. Reduce distractions at meals, talking.   Medication Administration: Whole meds with puree (for safer swallowing)    Other  Recommendations Recommended Consults:  (Dietician) Oral Care Recommendations: Oral care BID;Oral care before and after PO;Patient independent with oral care (setup suport) Other Recommendations:  (n/a)    Recommendations for follow up therapy are one component of a multi-disciplinary discharge planning process, led by the attending physician.  Recommendations may be updated based on patient status, additional functional criteria and insurance authorization.  Follow up Recommendations None      Frequency and Duration  (n/a)   (n/a)       Prognosis Prognosis for Safe Diet Advancement: Good Barriers to Reach Goals: Time post onset;Severity of deficits Barriers/Prognosis Comment: Hiatal Hernia      Swallow Study   General Date of Onset: 09/07/21 HPI: Pt is a 87 yo woman with hx brain aneurysm (states she is f/b outside eye doctor for this but has not followed up in a while), COPD, HTN, sleep apnea, vertigo admitted after syncopal  episode upon standing up yesterday evening. Patient states that on Monday night she was awoken from sleep with a 10/10 thunderclap worst headache of her life and it has persisted since that time, only slightly improved with tylenol, is currently 9/10. She has never had a headache like this before. Then yesterday she was standing up from a seated position, felt briefly lightheaded, then had a syncopal episode. She does not rememberwhat happened next but does not think she hit her head. Her son ran into the room a few seconds later and held her in his arms. At that point she had a very fine tremor in her BUE which he demonstrated to me more as tremulousness without rhythmic jerking. There was concern for possible seizure given Na+ 121 down from 129 two days ago.  She has had poor oral intake due to nausea and vomiting for about 2 days  and is also on hydrochlorothiazide    CTA demonstrated R ICA aneurysm 2.8 x 2.0 cm, partially thrombosed, with filling portion measuring up to 1.5cm.   CXR: no acute cardiopulmonary disease.  Large hiatal hernia.   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 She tells me this was related to her aneurysm and a cataract repair. She has no other focal neurologic deficits. Type of Study: Bedside Swallow Evaluation Previous Swallow Assessment: none Diet Prior to this Study: Regular;Thin liquids Temperature Spikes Noted: No (wbc 9.3) Respiratory Status: Room air History of Recent Intubation: No Behavior/Cognition: Alert;Cooperative;Pleasant mood (mild c/o headache - NSG informed) Oral Cavity Assessment: Within Functional Limits Oral Care Completed by SLP: Recent completion by staff Oral Cavity - Dentition: Dentures, top;Dentures, bottom Vision: Functional for self-feeding Self-Feeding Abilities: Able to feed self;Needs set up (min) Patient Positioning: Upright in bed (supported more comfortable positioning) Baseline Vocal Quality: Normal;Low vocal intensity Volitional Cough: Strong Volitional Swallow: Able to elicit    Oral/Motor/Sensory Function Overall Oral Motor/Sensory Function: Within functional limits   Ice Chips Ice chips: Not tested   Thin Liquid Thin Liquid: Within functional limits Presentation: Cup;Self Fed;Straw (10+ trials total)    Nectar Thick Nectar Thick Liquid: Not tested   Honey Thick Honey Thick Liquid: Not tested   Puree Puree: Within functional limits Presentation: Self Fed;Spoon (3 trials)   Solid     Solid: Within functional limits Presentation: Self Fed;Spoon (6 trials)        Jerilynn Som, MS, CCC-SLP Speech Language Pathologist Rehab Services [phone] Azaylia Fong 09/12/2021,3:14 PM

## 2021-09-12 NOTE — Progress Notes (Signed)
Patient ID: Briana Lozano, female   DOB: [birth year], 87 y.o.   MRN: [id number] [facility] PROGRESS NOTE  Anastasya Jewell DTO:[id number] DOB: 1929-06-08 DOA: 09/09/2021 PCP: Dorothey Baseman, MD  HPI/Subjective: Patient this morning had a headache when her blood pressure was up.  This afternoon the headache is gone and blood pressure has trended a little bit better.  Patient was able to eat a little bit better today than she has been.  Still having right facial numbness.  Came in with altered mental status and acute hyponatremia and accelerated hypertension.  Objective: Vitals:   09/12/21 0848 09/12/21 1229  BP: (!) 165/83 (!) 153/75  Pulse: (!) 50 (!) 53  Resp: 19 20  Temp: (!) 97.4 F (36.3 C) 97.9 F (36.6 C)  SpO2: 95% 95%    Intake/Output Summary (Last 24 hours) at 09/12/2021 1556 Last data filed at 09/12/2021 1031 Gross per 24 hour  Intake 360 ml  Output 0 ml  Net 360 ml   Filed Weights   09/09/21 0208  Weight: 55 kg    ROS: Review of Systems  Respiratory:  Negative for shortness of breath.   Cardiovascular:  Negative for chest pain.  Gastrointestinal:  Negative for abdominal pain, nausea and vomiting.  Neurological:  Positive for headaches.  Exam: Physical Exam HENT:     Head: Normocephalic.     Mouth/Throat:     Pharynx: No oropharyngeal exudate.  Eyes:     General: Lids are normal.     Conjunctiva/sclera: Conjunctivae normal.  Cardiovascular:     Rate and Rhythm: Normal rate and regular rhythm.     Heart sounds: Normal heart sounds, S1 normal and S2 normal.  Pulmonary:     Breath sounds: Normal breath sounds. No decreased breath sounds, wheezing, rhonchi or rales.  Abdominal:     Palpations: Abdomen is soft.     Tenderness: There is no abdominal tenderness.  Musculoskeletal:     Right lower leg: No swelling.     Left lower leg: No swelling.  Skin:    General: Skin is warm.     Findings: No rash.  Neurological:     Mental Status:  She is alert and oriented to person, place, and time.      Scheduled Meds:  amLODipine  10 mg Oral Daily   bisoprolol  5 mg Oral Daily   citalopram  10 mg Oral Daily   divalproex  125 mg Oral Q12H   enoxaparin (LOVENOX) injection  40 mg Subcutaneous Q24H   levothyroxine  75 mcg Oral QAC breakfast   losartan  50 mg Oral Daily   multivitamin-lutein  1 capsule Oral Daily   nystatin  5 mL Oral QID   pantoprazole  40 mg Oral Daily   spironolactone  25 mg Oral Daily     Assessment/Plan:  Accelerated hypertension at times.  Increase Norvasc to 10 mg this morning.  Continue bisoprolol, losartan and Aldactone.  Blood pressure again high this morning but trending a little bit better this afternoon.  Continue Depakote for headache as per neurology. Numbness right side of the face.  MRI brain negative for stroke. Hyponatremia likely SIADH.  Holding hydrochlorothiazide.  Sodium down to 128 today.  Recheck again tomorrow morning. ICA aneurysm.  Follow-up as outpatient Hypothyroidism unspecified.  On levothyroxine GERD and hiatal hernia on PPI.  Appreciate swallow evaluation. Generalized weakness.  Continue working with physical therapy Poor appetite.  Continue to encourage eating small meals a day.  Code Status:     Code Status Orders  (From admission, onward)           Start     Ordered   09/09/21 0959  Full code  Continuous        09/09/21 0959           Code Status History     Date Active Date Inactive Code Status Order ID Comments User Context   04/17/2017 1320 04/18/2017 0312 Full Code [id number]  Lisbeth Renshaw, MD HOV      Advance Directive Documentation    Flowsheet Row Most Recent Value  Type of Advance Directive Healthcare Power of Attorney  Pre-existing out of facility DNR order (yellow form or pink MOST form) --  "MOST" Form in Place? --      Family Communication: Patient's son nervous about taking the patient home with blood pressure elevations.   Blood pressure meds take about 5 days to fully get into the system. Disposition Plan: Status is: Inpatient  Dispo: The patient is from: Home              Anticipated d/c is to: Home with home health              Patient currently doing better this afternoon and likely will be able to go home tomorrow morning   Difficult to place patient.  No.  Consultants: Nephrology  Time spent: 28 minutes  Nabilah Davoli [employer]

## 2021-09-12 NOTE — Care Management Important Message (Signed)
Important Message  Patient Details  Name: Briana Lozano MRN: [id number] Date of Birth: 1929/01/06   Medicare Important Message Given:  Yes     Alvera Tourigny, Stephan Minister 09/12/2021, 2:16 PM

## 2021-09-12 NOTE — Progress Notes (Addendum)
Physical Therapy Treatment Patient Details Name: Briana Lozano MRN: [id number] DOB: 11/24/1929 Today's Date: 09/12/2021   History of Present Illness Briana Lozano is a 87 y.o. female with past medical history of hypertension, GERD, COPD, and arthritis who presents to the ED 08/12/2021 complaining of headache and nausea. Admitted for hyponatremia, severe headache and acute medabolic encephalopathy, and ICA aneurysm. Seen by neurology and neurosurgery and reccomend outpatien follow-up and no surgical intervention at this point.    PT Comments    Pt received supine in bed, agreeable to therapy. Pt stated she needed to use the restroom; PT assisted with toilet transfer into the restroom in which pt sat for extended period of time attempting BM. After toileting, pt reported fatigue and asked for a seated rest break on EOB. Pt was able to ambulate an additional 32ft at end of session with significant fatigue. CGA provided at all times to steady. Family (son and daughter-in-law) arrived at end of session. PT educated on lack of stability and function endurance pt currently endorses. Pt son states that pt with have 24 hour care at home. Continuing to rec d/c home with HHPT due to increased family support at home. Would benefit from skilled PT to address above deficits and promote optimal return to PLOF.     Recommendations for follow up therapy are one component of a multi-disciplinary discharge planning process, led by the attending physician.  Recommendations may be updated based on patient status, additional functional criteria and insurance authorization.  Follow Up Recommendations  Home health PT;Supervision for mobility/OOB     Equipment Recommendations  3in1 (PT);Rolling walker with 5" wheels    Recommendations for Other Services       Precautions / Restrictions Precautions Precautions: Fall Restrictions Weight Bearing Restrictions: No     Mobility  Bed Mobility Overal  bed mobility: Modified Independent             General bed mobility comments: increased time and effort, bed features used    Transfers Overall transfer level: Needs assistance Equipment used: Rolling walker (2 wheeled) Transfers: Sit to/from Stand Sit to Stand: Min assist         General transfer comment: x2 reps from fully lowered surface; MIN A to provide increased steadying and a brace for pt to lift. VC on eccentric control and aligning to surface prior to sitting down as pt would have missed the toilet seat without PT guidance.  Ambulation/Gait Ambulation/Gait assistance: Min guard   Assistive device: Rolling walker (2 wheeled) Gait Pattern/deviations: Decreased stride length;Trunk flexed;Step-through pattern Gait velocity: significantly decreased   General Gait Details: 60ft + 77ft + 30ft using RW, CGA for safety to steady. Significantly decreased velocity.   Stairs             Wheelchair Mobility    Modified Rankin (Stroke Patients Only)       Balance Overall balance assessment: Needs assistance Sitting-balance support: Feet supported;Bilateral upper extremity supported Sitting balance-Leahy Scale: Good     Standing balance support: Bilateral upper extremity supported;During functional activity Standing balance-Leahy Scale: Poor Standing balance comment: UE support required on RW with CGA during static and dynamic standing                            Cognition Arousal/Alertness: Awake/alert Behavior During Therapy: WFL for tasks assessed/performed Overall Cognitive Status: Within Functional Limits for tasks assessed  Exercises      General Comments        Pertinent Vitals/Pain Pain Assessment: No/denies pain    Home Living                      Prior Function            PT Goals (current goals can now be found in the care plan section) Acute Rehab PT  Goals Patient Stated Goal: get better PT Goal Formulation: With patient Time For Goal Achievement: 09/24/21 Potential to Achieve Goals: Good    Frequency    Min 2X/week      PT Plan      Co-evaluation              AM-PAC PT "6 Clicks" Mobility   Outcome Measure  Help needed turning from your back to your side while in a flat bed without using bedrails?: None Help needed moving from lying on your back to sitting on the side of a flat bed without using bedrails?: None Help needed moving to and from a bed to a chair (including a wheelchair)?: A Little Help needed standing up from a chair using your arms (e.g., wheelchair or bedside chair)?: A Little Help needed to walk in hospital room?: A Little Help needed climbing 3-5 steps with a railing? : A Lot 6 Click Score: 19    End of Session Equipment Utilized During Treatment: Gait belt Activity Tolerance: Patient tolerated treatment well Patient left: in bed;with call bell/phone within reach;with family/visitor present;with bed alarm set;with nursing/sitter in room Nurse Communication: Mobility status PT Visit Diagnosis: Unsteadiness on feet (R26.81);History of falling (Z91.81);Muscle weakness (generalized) (M62.81);Difficulty in walking, not elsewhere classified (R26.2)     Time: 5009-3818 PT Time Calculation (min) (ACUTE ONLY): 27 min  Charges:  $Gait Training: 8-22 mins $Therapeutic Activity: 8-22 mins                     Basilia Jumbo PT, DPT 09/12/21 5:50 PM [phone]    [name] 09/12/2021, 5:50 PM

## 2021-09-12 NOTE — Progress Notes (Signed)
Met with the patient to discuss DC plan and needs She has a RW and a shower seat at home and will need a 3 in 1, [employer] will deliver to the room prior to DC She needs [facility] services, [facility] will accept the patient for PT, OT and RN,  She will be staying with her adult son, She has transportation set up to go to the Doctor She can afford her medications No additional needs

## 2021-09-12 NOTE — Progress Notes (Signed)
Caren Garske  MRN: [id number]  DOB/AGE: [birth year] 87 y.o.  Primary Care Physician:Bronstein, Onalee Hua, MD  Admit date: 09/09/2021  Chief Complaint:  Chief Complaint  Patient presents with   Altered Mental Status    S-Pt presented on  09/09/2021 with  Chief Complaint  Patient presents with   Altered Mental Status  . Patient seen laying in bed Denies shortness of breath and nausea Reports loss of appetite    Medications  amLODipine  10 mg Oral Daily   bisoprolol  5 mg Oral Daily   citalopram  10 mg Oral Daily   divalproex  125 mg Oral Q12H   enoxaparin (LOVENOX) injection  40 mg Subcutaneous Q24H   levothyroxine  75 mcg Oral QAC breakfast   losartan  50 mg Oral Daily   multivitamin-lutein  1 capsule Oral Daily   nystatin  5 mL Oral QID   pantoprazole  40 mg Oral Daily   spironolactone  25 mg Oral Daily         EGB:TDVVO from the symptoms mentioned above,there are no other symptoms referable to all systems reviewed.  Physical Exam: Vital signs in last 24 hours: Temp:  [97.4 F (36.3 C)-97.9 F (36.6 C)] 97.9 F (36.6 C) (10/03 1229) Pulse Rate:  [50-59] 53 (10/03 1229) Resp:  [14-20] 20 (10/03 1229) BP: (143-179)/(65-85) 153/75 (10/03 1229) SpO2:  [93 %-97 %] 95 % (10/03 1229) Weight change:  Last BM Date: 09/11/21  Intake/Output from previous day: 10/02 0701 - 10/03 0700 In: 240 [P.O.:240] Out: 0  Total I/O In: 120 [P.O.:120] Out: -    Physical Exam:  General- NAD pt is awake,alert, oriented to time place and person  Resp- No acute REsp distress, CTA B/L NO Rhonchi  CVS- S1S2 regular in rate and rhythm  GIT- BS+, soft, Non tender , Non distended  EXT- No LE Edema,  No Cyanosis   Lab Results:  CBC  Recent Labs    09/10/21 0632  WBC 9.3  HGB 14.1  HCT 38.3  PLT 179     BMET  Recent Labs    09/11/21 0504 09/12/21 0431  NA 131* 128*  K 3.4* 3.9  CL 92* 94*  CO2 28 28  GLUCOSE 94 93  BUN 8 11  CREATININE 0.58  0.54  CALCIUM 9.2 8.7*       Most recent Creatinine trend  Lab Results  Component Value Date   CREATININE 0.54 09/12/2021   CREATININE 0.58 09/11/2021   CREATININE 0.66 09/10/2021       MICRO   Recent Results (from the past 240 hour(s))  Resp Panel by RT-PCR (Flu A&B, Covid) Nasopharyngeal Swab     Status: None   Collection Time: 09/07/21 10:45 AM   Specimen: Nasopharyngeal Swab; Nasopharyngeal(NP) swabs in vial transport medium  Result Value Ref Range Status   SARS Coronavirus 2 by RT PCR NEGATIVE NEGATIVE Final    Comment: (NOTE) SARS-CoV-2 target nucleic acids are NOT DETECTED.  The SARS-CoV-2 RNA is generally detectable in upper respiratory specimens during the acute phase of infection. The lowest concentration of SARS-CoV-2 viral copies this assay can detect is 138 copies/mL. A negative result does not preclude SARS-Cov-2 infection and should not be used as the sole basis for treatment or other patient management decisions. A negative result may occur with  improper specimen collection/handling, submission of specimen other than nasopharyngeal swab, presence of viral mutation(s) within the areas targeted by this assay, and inadequate number of viral copies(<138 copies/mL). A  negative result must be combined with clinical observations, patient history, and epidemiological information. The expected result is Negative.  Fact Sheet for Patients:  BloggerCourse.com  Fact Sheet for Healthcare Providers:  SeriousBroker.it  This test is no t yet approved or cleared by the [place] FDA and  has been authorized for detection and/or diagnosis of SARS-CoV-2 by FDA under an Emergency Use Authorization (EUA). This EUA will remain  in effect (meaning this test can be used) for the duration of the COVID-19 declaration under Section 564(b)(1) of the Act, 21 U.S.C.section 360bbb-3(b)(1), unless the authorization is  terminated  or revoked sooner.       Influenza A by PCR NEGATIVE NEGATIVE Final   Influenza B by PCR NEGATIVE NEGATIVE Final    Comment: (NOTE) The Xpert Xpress SARS-CoV-2/FLU/RSV plus assay is intended as an aid in the diagnosis of influenza from Nasopharyngeal swab specimens and should not be used as a sole basis for treatment. Nasal washings and aspirates are unacceptable for Xpert Xpress SARS-CoV-2/FLU/RSV testing.  Fact Sheet for Patients: BloggerCourse.com  Fact Sheet for Healthcare Providers: SeriousBroker.it  This test is not yet approved or cleared by the [place] FDA and has been authorized for detection and/or diagnosis of SARS-CoV-2 by FDA under an Emergency Use Authorization (EUA). This EUA will remain in effect (meaning this test can be used) for the duration of the COVID-19 declaration under Section 564(b)(1) of the Act, 21 U.S.C. section 360bbb-3(b)(1), unless the authorization is terminated or revoked.  Performed at [facility], [address] [place], [place] [address]   Culture, blood (Routine X 2) w Reflex to ID Panel     Status: None (Preliminary result)   Collection Time: 09/09/21  2:12 AM   Specimen: BLOOD  Result Value Ref Range Status   Specimen Description BLOOD LEFT ASSIST CONTROL  Final   Special Requests   Final    BOTTLES DRAWN AEROBIC AND ANAEROBIC Blood Culture adequate volume   Culture   Final    NO GROWTH 3 DAYS Performed at [facility], [address] [place], [place] [address]    Report Status PENDING  Incomplete  Culture, blood (Routine X 2) w Reflex to ID Panel     Status: None (Preliminary result)   Collection Time: 09/09/21  2:12 AM   Specimen: BLOOD  Result Value Ref Range Status   Specimen Description BLOOD RIGHT ASSIST CONTROL  Final   Special Requests   Final    BOTTLES DRAWN AEROBIC AND ANAEROBIC Blood Culture adequate volume   Culture   Final     NO GROWTH 3 DAYS Performed at [facility], [address] [place], [place] [address]    Report Status PENDING  Incomplete  Resp Panel by RT-PCR (Flu A&B, Covid) Nasopharyngeal Swab     Status: None   Collection Time: 09/09/21  2:12 AM   Specimen: Nasopharyngeal Swab; Nasopharyngeal(NP) swabs in vial transport medium  Result Value Ref Range Status   SARS Coronavirus 2 by RT PCR NEGATIVE NEGATIVE Final    Comment: (NOTE) SARS-CoV-2 target nucleic acids are NOT DETECTED.  The SARS-CoV-2 RNA is generally detectable in upper respiratory specimens during the acute phase of infection. The lowest concentration of SARS-CoV-2 viral copies this assay can detect is 138 copies/mL. A negative result does not preclude SARS-Cov-2 infection and should not be used as the sole basis for treatment or other patient management decisions. A negative result may occur with  improper specimen collection/handling, submission of  specimen other than nasopharyngeal swab, presence of viral mutation(s) within the areas targeted by this assay, and inadequate number of viral copies(<138 copies/mL). A negative result must be combined with clinical observations, patient history, and epidemiological information. The expected result is Negative.  Fact Sheet for Patients:  BloggerCourse.com  Fact Sheet for Healthcare Providers:  SeriousBroker.it  This test is no t yet approved or cleared by the [place] FDA and  has been authorized for detection and/or diagnosis of SARS-CoV-2 by FDA under an Emergency Use Authorization (EUA). This EUA will remain  in effect (meaning this test can be used) for the duration of the COVID-19 declaration under Section 564(b)(1) of the Act, 21 U.S.C.section 360bbb-3(b)(1), unless the authorization is terminated  or revoked sooner.       Influenza A by PCR NEGATIVE NEGATIVE Final   Influenza B by PCR NEGATIVE NEGATIVE  Final    Comment: (NOTE) The Xpert Xpress SARS-CoV-2/FLU/RSV plus assay is intended as an aid in the diagnosis of influenza from Nasopharyngeal swab specimens and should not be used as a sole basis for treatment. Nasal washings and aspirates are unacceptable for Xpert Xpress SARS-CoV-2/FLU/RSV testing.  Fact Sheet for Patients: BloggerCourse.com  Fact Sheet for Healthcare Providers: SeriousBroker.it  This test is not yet approved or cleared by the [place] FDA and has been authorized for detection and/or diagnosis of SARS-CoV-2 by FDA under an Emergency Use Authorization (EUA). This EUA will remain in effect (meaning this test can be used) for the duration of the COVID-19 declaration under Section 564(b)(1) of the Act, 21 U.S.C. section 360bbb-3(b)(1), unless the authorization is terminated or revoked.  Performed at [facility], [address] [place] [place], [place] [address]          Impression:  Ms. Lyana Asbill is a 87 y.o.  female with medical history of GERD, COPD, and hypertension, who was admitted to [facility] on 09/09/2021 for Hyponatremia [E87.1]   1)Hyponatremia Patient had hyponatremia secondary to multiple factors Patient was hypovolemic Patient was on hydrochlorothiazide Patient responded well to the current treatment Sodium reduced to 128 today. States she has poor appetite but drinks a lot. Educated patient on fluid restriction and encouraged to eat small meals.    2)Hypertension. Home regimen includes Losartan. Currently prescribed amlodipine, bisoprolol, losartan and spironolactone. BP 153/75   3) right ICA aneurysm Patient is being followed by neurology, neurosurgery and primary team   4) hypokalemia Patient is on losartan and spironolactone Currently 3.9   Lisha Vitale 09/12/2021, 1:27 PM

## 2021-09-13 DIAGNOSIS — I951 Orthostatic hypotension: Secondary | ICD-10-CM

## 2021-09-13 LAB — BASIC METABOLIC PANEL
Anion gap: 7 (ref 5–15)
BUN: 11 mg/dL (ref 8–23)
CO2: 26 mmol/L (ref 22–32)
Calcium: 8.7 mg/dL — ABNORMAL LOW (ref 8.9–10.3)
Chloride: 97 mmol/L — ABNORMAL LOW (ref 98–111)
Creatinine, Ser: 0.67 mg/dL (ref 0.44–1.00)
GFR, Estimated: >60 mL/min (ref >60)
Glucose, Bld: 96 mg/dL (ref 70–99)
Potassium: 4 mmol/L (ref 3.5–5.1)
Sodium: 130 mmol/L — ABNORMAL LOW (ref 135–145)

## 2021-09-13 MED ORDER — BISOPROLOL FUMARATE 5 MG PO TABS
2.5000 mg | ORAL_TABLET | Freq: Every day | ORAL | Status: DC
  Administered 2021-09-14: 2.5 mg via ORAL
  Filled 2021-09-13: qty 0.5

## 2021-09-13 MED ORDER — SODIUM CHLORIDE 0.9 % IV BOLUS
500.0000 mL | Freq: Once | INTRAVENOUS | Status: AC
  Administered 2021-09-13: 500 mL via INTRAVENOUS

## 2021-09-13 NOTE — Progress Notes (Signed)
Patient ID: Briana Lozano, female   DOB: March 27, 1929, 87 y.o.   MRN: [id number] [facility] PROGRESS NOTE  Celeste Tavenner JKK:[id number] DOB: 10/18/1929 DOA: 09/09/2021 PCP: Dorothey Baseman, MD  HPI/Subjective: The patient needed help even to get up to the commode with 1+ assist.  The patient's blood pressure was better controlled but found to be orthostatic this morning.  Held blood pressure medications this morning gave a fluid bolus.  Occasional headache.  Has chronic right eye pain.  Objective: Vitals:   09/13/21 0800 09/13/21 1140  BP: (!) 168/76 (!) 145/70  Pulse: (!) 58 (!) 52  Resp: 15 16  Temp: 97.9 F (36.6 C) 97.7 F (36.5 C)  SpO2: 95% 95%    Intake/Output Summary (Last 24 hours) at 09/13/2021 1301 Last data filed at 09/12/2021 1900 Gross per 24 hour  Intake 120 ml  Output --  Net 120 ml   Filed Weights   09/09/21 0208  Weight: 55 kg    ROS: Review of Systems  Respiratory:  Negative for shortness of breath.   Cardiovascular:  Negative for chest pain.  Gastrointestinal:  Negative for abdominal pain, nausea and vomiting.  Neurological:  Positive for headaches.  Exam: Physical Exam HENT:     Head: Normocephalic.     Mouth/Throat:     Pharynx: No oropharyngeal exudate.  Eyes:     General: Lids are normal.     Conjunctiva/sclera: Conjunctivae normal.  Cardiovascular:     Rate and Rhythm: Normal rate and regular rhythm.     Heart sounds: Normal heart sounds, S1 normal and S2 normal.  Pulmonary:     Breath sounds: No decreased breath sounds, wheezing, rhonchi or rales.  Abdominal:     Palpations: Abdomen is soft.     Tenderness: There is no abdominal tenderness.  Musculoskeletal:     Right lower leg: No swelling.     Left lower leg: No swelling.  Skin:    General: Skin is warm.     Findings: No rash.  Neurological:     Mental Status: She is alert and oriented to person, place, and time.      Scheduled Meds:  [START ON  09/14/2021] bisoprolol  2.5 mg Oral Daily   citalopram  10 mg Oral Daily   divalproex  125 mg Oral Q12H   enoxaparin (LOVENOX) injection  40 mg Subcutaneous Q24H   levothyroxine  75 mcg Oral QAC breakfast   losartan  50 mg Oral Daily   multivitamin-lutein  1 capsule Oral Daily   nystatin  5 mL Oral QID   pantoprazole  40 mg Oral Daily   Brief history.  87 year old female admitted on 09/09/2021 with weakness.  She was found to have accelerated hypertension and an ICA aneurysm.  CT angiogram showing partially thrombosed right ICA aneurysm with filling portion measuring 1.5 cm.  MRI of the brain showed moderate chronic small vessel ischemic disease and cerebral atrophy and no acute findings.  Patient was seen by neurosurgery and neurology and with family discussion no intervention will be pursued.  Neurology recommended Depakote for headache and controlling blood pressure.  Was planning on sending patient home but then could hardly walk and was found to be orthostatic.  Blood pressure medications had to be held and IV fluid bolus given.  Initially had low sodium on presentation and hydrochlorothiazide was held.  Past medical history of brain aneurysm, COPD, depression, GERD, hiatal hernia, hypertension, hypothyroidism, osteoporosis.  Assessment/Plan:  Orthostatic hypotension this morning.  Likely with adding medications for accelerated hypertension.  Will hold medications this morning and get rid of Norvasc and Aldactone which was added.  Give a fluid bolus and monitor.  Before adding medications for blood pressure need to check orthostatics. Numbness right side of the face.  MRI of the brain negative for stroke Hyponatremia likely secondary to SIADH.  Holding hydrochlorothiazide.  Sodium 130 today. ICA aneurysm.  Follow-up as outpatient.  Seen by neurology and neurosurgery.  Medical follow-up as outpatient. Hypothyroidism unspecified.  On levothyroxine GERD and hiatal hernia.  Continue PPI.   Appreciate swallow evaluation. Generalized weakness.  Physical therapy change the recommendation to rehab today. Poor appetite.  Continue to encourage eating.  Likely will have to eat 7 or 8 small meals per day rather than 3 big meals.      Code Status:     Code Status Orders  (From admission, onward)           Start     Ordered   09/09/21 0959  Full code  Continuous        09/09/21 0959           Code Status History     Date Active Date Inactive Code Status Order ID Comments User Context   04/17/2017 1320 04/18/2017 0312 Full Code [id number]  Lisbeth Renshaw, MD HOV      Advance Directive Documentation    Flowsheet Row Most Recent Value  Type of Advance Directive Healthcare Power of Attorney  Pre-existing out of facility DNR order (yellow form or pink MOST form) --  "MOST" Form in Place? --      Family Communication: Spoke with son at the bedside Disposition Plan: Status is: Inpatient  Dispo: The patient is from: Home              Anticipated d/c is to: Rehab when bed available and not orthostatic              Patient currently orthostatic with blood pressure dropping into the 70s with standing and its likely one of the reasons why she is so weak.  Holding blood pressure medications today and recheck orthostatics later on.  Get rid of Norvasc and Aldactone.  Bilateral blood pressure on the higher side then too low.   Difficult to place patient.  No  Consultants: Nephrology Seen by neurosurgery and neurology but they signed off.  Time spent: 27 minutes  Aleeza [employer]

## 2021-09-13 NOTE — TOC Progression Note (Signed)
Transition of Care [facility]) - Progression Note    Patient Details  Name: Briana Lozano MRN: [id number] Date of Birth: [birth year]  Transition of Care [facility]) CM/SW [place], RN Phone Number: 09/13/2021, 10:15 AM  Clinical Narrative:    Met with the patient and her son in the room, They are wanting her to go to SNF instesad of home with HH, I explained that the recommendation at this time is for [facility], PT will reevaluate her today to determine if she would meet the guidelines for STR SNF, I explained the bedsearch process, She is agreeable to a bed search, she has had 4 covid vaccines, PASSR Obtained, FL2 completed, Bedsearch sent   Expected Discharge Plan: [place] Barriers to Discharge: Barriers Resolved  Expected Discharge Plan and Services Expected Discharge Plan: [place]   Discharge Planning Services: CM Consult   Living arrangements for the past 2 months: Single Family Home                 DME Arranged: 3-N-1 DME Agency: [employer] Date DME Agency Contacted: 09/12/21 Time DME Agency Contacted: 56 Representative spoke with at DME Agency: [place] Arranged: PT, OT, RN [place] Agency: [facility] Date [place]: 09/12/21 Time [place]: 1050 Representative spoke with at [place]: [place] Determinants of Health ([place]) Interventions    Readmission Risk Interventions No flowsheet data found.

## 2021-09-13 NOTE — TOC Progression Note (Signed)
Transition of Care [facility]) - Progression Note    Patient Details  Name: Briana Lozano MRN: [id number] Date of Birth: 1929/01/02  Transition of Care [facility]) CM/SW Contact  Barrie Dunker, RN Phone Number: 09/13/2021, 1:09 PM  Clinical Narrative:     Reached out to local area STR SNF and requested that they review the patient for a potential bed offer, will review the offers once obtained  Expected Discharge Plan: Home w Home Health Services Barriers to Discharge: Barriers Resolved  Expected Discharge Plan and Services Expected Discharge Plan: Home w Home Health Services   Discharge Planning Services: CM Consult   Living arrangements for the past 2 months: Single Family Home                 DME Arranged: 3-N-1 DME Agency: [employer] Date DME Agency Contacted: 09/12/21 Time DME Agency Contacted: 1050 Representative spoke with at DME Agency: [place] HH Arranged: PT, OT, RN HH Agency: [facility] Date [facility] Agency Contacted: 09/12/21 Time HH Agency Contacted: 1050 Representative spoke with at [facility] Agency: Kandee Keen   Social Determinants of Health (SDOH) Interventions    Readmission Risk Interventions No flowsheet data found.

## 2021-09-13 NOTE — Progress Notes (Signed)
Physical Therapy Treatment Patient Details Name: Briana Lozano MRN: [id number] DOB: [birth year] Today's Date: 09/13/2021   History of Present Illness Briana Lozano is a 87 y.o. female with past medical history of hypertension, GERD, COPD, and arthritis who presents to the ED 08/12/2021 complaining of headache and nausea. Admitted for hyponatremia, severe headache and acute medabolic encephalopathy, and ICA aneurysm. Seen by neurology and neurosurgery and reccomend outpatien follow-up and no surgical intervention at this point.    PT Comments    Pt received sitting in recliner, RN leaving room and reporting orthostatics to PT. PT aware and proceeded with mobility with caution. Pt reports significant weakness throughout session and required multiple standing rest breaks during ambulation to the restroom. She also reported mild lightheadedness during ambulation and upon initial stand with each STS. Ambulation distance has progressively decreased throughout pt hospital stay - today pt was able to ambulate 79ft x2 reps in order to perform toileting in the restroom. Due to increased fatigue, pt was inappropriate for further mobility during session. PT is changing d/c recs to SNF due to progressive decline in activity tolerance and for safety due to symptomatic orthostatics reported by RN. Would benefit from skilled PT to address above deficits and promote optimal return to PLOF.   Recommendations for follow up therapy are one component of a multi-disciplinary discharge planning process, led by the attending physician.  Recommendations may be updated based on patient status, additional functional criteria and insurance authorization.  Follow Up Recommendations  SNF;Supervision for mobility/OOB     Equipment Recommendations  Other (comment) (TBD at next venue of care)    Recommendations for Other Services       Precautions / Restrictions Precautions Precautions:  Fall Restrictions Weight Bearing Restrictions: No     Mobility  Bed Mobility               General bed mobility comments: not observed - pt in recliner at start and end of session    Transfers Overall transfer level: Needs assistance Equipment used: Rolling walker (2 wheeled) Transfers: Sit to/from Stand Sit to Stand: Min assist         General transfer comment: x2 reps from fully lowered surface; MIN A to provide increased steadying and a brace for pt to push up against to lift. Improved safety awareness with aligning to surface prior to sitting down as pt asked for guidance.  Ambulation/Gait Ambulation/Gait assistance: Min guard Gait Distance (Feet): 20 Feet Assistive device: Rolling walker (2 wheeled) Gait Pattern/deviations: Decreased stride length;Trunk flexed;Step-through pattern Gait velocity: significantly decreased   General Gait Details: 57ft x2 using RW, CGA for safety and to steady. Significantly decreased velocity. Multiple reports of pt feeling weak, mild lightheadedness reported.   Stairs             Wheelchair Mobility    Modified Rankin (Stroke Patients Only)       Balance Overall balance assessment: Needs assistance Sitting-balance support: Feet supported;Bilateral upper extremity supported Sitting balance-Leahy Scale: Fair Sitting balance - Comments: pt leaning forward elbows on knees while on toilet   Standing balance support: Bilateral upper extremity supported;During functional activity Standing balance-Leahy Scale: Poor Standing balance comment: UE support required on RW with CGA during ambulation                            Cognition Arousal/Alertness: Awake/alert Behavior During Therapy: WFL for tasks assessed/performed Overall Cognitive Status: Within Functional Limits for  tasks assessed                                        Exercises Other Exercises Other Exercises: PT called son to discuss  pt decline in activity tolerance and change in d/c rec for pt safety.    General Comments        Pertinent Vitals/Pain Pain Assessment: No/denies pain    Home Living                      Prior Function            PT Goals (current goals can now be found in the care plan section) Acute Rehab PT Goals Patient Stated Goal: get better PT Goal Formulation: With patient Time For Goal Achievement: 09/24/21 Potential to Achieve Goals: Good    Frequency    Min 2X/week      PT Plan      Co-evaluation              AM-PAC PT "6 Clicks" Mobility   Outcome Measure  Help needed turning from your back to your side while in a flat bed without using bedrails?: None Help needed moving from lying on your back to sitting on the side of a flat bed without using bedrails?: A Little Help needed moving to and from a bed to a chair (including a wheelchair)?: A Little Help needed standing up from a chair using your arms (e.g., wheelchair or bedside chair)?: A Little Help needed to walk in hospital room?: A Little Help needed climbing 3-5 steps with a railing? : Total 6 Click Score: 17    End of Session Equipment Utilized During Treatment: Gait belt Activity Tolerance: Patient limited by fatigue Patient left: with call bell/phone within reach;in chair;with chair alarm set Nurse Communication: Mobility status PT Visit Diagnosis: Unsteadiness on feet (R26.81);History of falling (Z91.81);Muscle weakness (generalized) (M62.81);Difficulty in walking, not elsewhere classified (R26.2)     Time: 4287-6811 PT Time Calculation (min) (ACUTE ONLY): 27 min  Charges:  $Therapeutic Activity: 23-37 mins                     Basilia Jumbo PT, DPT 09/13/21 11:16 AM [phone]    Briana Lozano 09/13/2021, 11:10 AM

## 2021-09-13 NOTE — Progress Notes (Signed)
[facility]  ROUNDING NOTE   Subjective:   Patient seen sitting at side of bed Nursing completing orthostatic vital signs checks Patient able to ambulate to chair with walker and RN assistance Denies shortness of breath Son at bedside Patient states she has eaten a small amount of each meal  Objective:  Vital signs in last 24 hours:  Temp:  [97.7 F (36.5 C)-98 F (36.7 C)] 97.7 F (36.5 C) (10/04 1140) Pulse Rate:  [52-58] 52 (10/04 1140) Resp:  [15-20] 16 (10/04 1140) BP: (120-168)/(63-78) 145/70 (10/04 1140) SpO2:  [94 %-95 %] 95 % (10/04 1140)  Weight change:  Filed Weights   09/09/21 0208  Weight: 55 kg    Intake/Output: I/O last 3 completed shifts: In: 480 [P.O.:480] Out: 0    Intake/Output this shift:  No intake/output data recorded.  Physical Exam: General: NAD, sitting up in chair  Head: Normocephalic, atraumatic. Moist oral mucosal membranes  Eyes: Anicteric  Lungs:  Clear to auscultation, normal effort  Heart: Regular rate and rhythm  Abdomen:  Soft, nontender  Extremities:  no peripheral edema.  Neurologic: Nonfocal, moving all four extremities  Skin: No lesions   Basic Metabolic Panel: Recent Labs  Lab 09/09/21 0517 09/09/21 2004 09/10/21 0118 09/10/21 8295 09/11/21 0504 09/12/21 0431 09/13/21 0416  NA 121* 125* 129* 132* 131* 128* 130*  K 3.0* 3.7  --  4.4 3.4* 3.9 4.0  CL 85* 93*  --  97* 92* 94* 97*  CO2 29 26  --  28 28 28 26   GLUCOSE 107* 104*  --  95 94 93 96  BUN 14 10  --  8 8 11 11   CREATININE 0.63 0.74  --  0.66 0.58 0.54 0.67  CALCIUM 8.4* 8.5*  --  8.5* 9.2 8.7* 8.7*  MG 1.7  --   --   --   --   --   --     Liver Function Tests: Recent Labs  Lab 09/06/21 2233 09/07/21 0808 09/09/21 0517  AST 26 33 47*  ALT 15 14 23   ALKPHOS 55 49 36*  BILITOT 1.0 1.1 1.1  PROT 7.7 7.2 6.2*  ALBUMIN 4.0 4.0 3.4*   Recent Labs  Lab 09/07/21 0808  LIPASE 30   Recent Labs  Lab 09/09/21 0212  AMMONIA 10     CBC: Recent Labs  Lab 09/06/21 2233 09/07/21 0808 09/09/21 0517 09/10/21 0632  WBC 8.5 7.3 10.0 9.3  NEUTROABS 5.8  --  7.3  --   HGB 15.4* 15.0 14.1 14.1  HCT 42.0 40.3 RESULTS UNAVAILABLE DUE TO INTERFERING SUBSTANCE 38.3  MCV 98.4 96.2 RESULTS UNAVAILABLE DUE TO INTERFERING SUBSTANCE 96.5  PLT 190 186 201 179    Cardiac Enzymes: No results for input(s): CKTOTAL, CKMB, CKMBINDEX, TROPONINI in the last 168 hours.  BNP: Invalid input(s): POCBNP  CBG: No results for input(s): GLUCAP in the last 168 hours.  Microbiology: Results for orders placed or performed during the hospital encounter of 09/09/21  Culture, blood (Routine X 2) w Reflex to ID Panel     Status: None (Preliminary result)   Collection Time: 09/09/21  2:12 AM   Specimen: BLOOD  Result Value Ref Range Status   Specimen Description BLOOD LEFT ASSIST CONTROL  Final   Special Requests   Final    BOTTLES DRAWN AEROBIC AND ANAEROBIC Blood Culture adequate volume   Culture   Final    NO GROWTH 4 DAYS Performed at [facility], [address] [place], [place] [address]    Report Status PENDING  Incomplete  Culture, blood (Routine X 2) w Reflex to ID Panel     Status: None (Preliminary result)   Collection Time: 09/09/21  2:12 AM   Specimen: BLOOD  Result Value Ref Range Status   Specimen Description BLOOD RIGHT ASSIST CONTROL  Final   Special Requests   Final    BOTTLES DRAWN AEROBIC AND ANAEROBIC Blood Culture adequate volume   Culture   Final    NO GROWTH 4 DAYS Performed at [facility], [address], [place], [place] [address]    Report Status PENDING  Incomplete  Resp Panel by RT-PCR (Flu A&B, Covid) Nasopharyngeal Swab     Status: None   Collection Time: 09/09/21  2:12 AM   Specimen: Nasopharyngeal Swab; Nasopharyngeal(NP) swabs in vial transport medium  Result Value Ref Range Status   SARS Coronavirus 2 by RT PCR NEGATIVE NEGATIVE Final    Comment: (NOTE) SARS-CoV-2  target nucleic acids are NOT DETECTED.  The SARS-CoV-2 RNA is generally detectable in upper respiratory specimens during the acute phase of infection. The lowest concentration of SARS-CoV-2 viral copies this assay can detect is 138 copies/mL. A negative result does not preclude SARS-Cov-2 infection and should not be used as the sole basis for treatment or other patient management decisions. A negative result may occur with  improper specimen collection/handling, submission of specimen other than nasopharyngeal swab, presence of viral mutation(s) within the areas targeted by this assay, and inadequate number of viral copies(<138 copies/mL). A negative result must be combined with clinical observations, patient history, and epidemiological information. The expected result is Negative.  Fact Sheet for Patients:  BloggerCourse.com  Fact Sheet for Healthcare Providers:  SeriousBroker.it  This test is no t yet approved or cleared by the [place] FDA and  has been authorized for detection and/or diagnosis of SARS-CoV-2 by FDA under an Emergency Use Authorization (EUA). This EUA will remain  in effect (meaning this test can be used) for the duration of the COVID-19 declaration under Section 564(b)(1) of the Act, 21 U.S.C.section 360bbb-3(b)(1), unless the authorization is terminated  or revoked sooner.       Influenza A by PCR NEGATIVE NEGATIVE Final   Influenza B by PCR NEGATIVE NEGATIVE Final    Comment: (NOTE) The Xpert Xpress SARS-CoV-2/FLU/RSV plus assay is intended as an aid in the diagnosis of influenza from Nasopharyngeal swab specimens and should not be used as a sole basis for treatment. Nasal washings and aspirates are unacceptable for Xpert Xpress SARS-CoV-2/FLU/RSV testing.  Fact Sheet for Patients: BloggerCourse.com  Fact Sheet for Healthcare  Providers: SeriousBroker.it  This test is not yet approved or cleared by the [place] FDA and has been authorized for detection and/or diagnosis of SARS-CoV-2 by FDA under an Emergency Use Authorization (EUA). This EUA will remain in effect (meaning this test can be used) for the duration of the COVID-19 declaration under Section 564(b)(1) of the Act, 21 U.S.C. section 360bbb-3(b)(1), unless the authorization is terminated or revoked.  Performed at [facility], [address] [place], [place] [address]     Coagulation Studies: No results for input(s): LABPROT, INR in the last 72 hours.  Urinalysis: No results for input(s): COLORURINE, LABSPEC, PHURINE, GLUCOSEU, HGBUR, BILIRUBINUR, KETONESUR, PROTEINUR, UROBILINOGEN, NITRITE, LEUKOCYTESUR in the last 72 hours.  Invalid input(s): APPERANCEUR    Imaging: No results found.   Medications:     [START ON 09/14/2021] bisoprolol  2.5  mg Oral Daily   citalopram  10 mg Oral Daily   divalproex  125 mg Oral Q12H   enoxaparin (LOVENOX) injection  40 mg Subcutaneous Q24H   levothyroxine  75 mcg Oral QAC breakfast   losartan  50 mg Oral Daily   multivitamin-lutein  1 capsule Oral Daily   nystatin  5 mL Oral QID   pantoprazole  40 mg Oral Daily   acetaminophen, diphenhydrAMINE, ondansetron **OR** ondansetron (ZOFRAN) IV, polyethylene glycol, polyvinyl alcohol, simethicone  Assessment/ Plan:  Ms. Briana Lozano is a 87 y.o.  female with medical history of GERD, COPD, and hypertension, who was admitted to [facility] on 09/09/2021 for Hyponatremia [E87.1]    Hyponatremia secondary hypovolemia and continued hydrochlorothiazide use. Received IVF and responded well. Sodium today 130. [place] IVF discontinued, but received a NS bolus over 5 hours today. Patient encouraged to eat meals. Will continue to monitor.   Hypertension Home regimen includes Losartan and bisoprolol-hydrochlorothiazide.  Diuretic held at this time. BP improved for this patient to 145/70       LOS: 4 Briana Lozano 10/4/20221:08 PM

## 2021-09-13 NOTE — NC FL2 (Signed)
[place] MEDICAID FL2 LEVEL OF CARE SCREENING TOOL     IDENTIFICATION  Patient Name: Briana Lozano Birthdate: October 01, 1929 Sex: female Admission Date (Current Location): 09/09/2021  [facility] and [place] Number:  Chiropodist and Address:  [facility], [address], [place], [place] [address]      Provider Number: [id number]  Attending Physician Name and Address:  Alford Highland, MD  Relative Name and Phone Number:  Nash Dimmer [phone]    Current Level of Care: Hospital Recommended Level of Care: Skilled Nursing Facility Prior Approval Number:    Date Approved/Denied:   PASRR Number: [id number]  Discharge Plan: SNF    Current Diagnoses: Patient Active Problem List   Diagnosis Date Noted   Numbness and tingling of right side of face    Gastroesophageal reflux disease without esophagitis    Headache 09/10/2021   General weakness    Acute metabolic encephalopathy    Hyponatremia 09/09/2021   Altered mental status, unspecified 09/09/2021   Brain aneurysm    COPD (chronic obstructive pulmonary disease) (HCC)    Depression    AMS (altered mental status)    Hiatal hernia with GERD    Hypokalemia    Abdominal pain 01/28/2019   Hypothyroidism 01/17/2018   Accelerated hypertension 01/17/2018   Intermediate coronary syndrome (HCC) 01/17/2018   Macular degeneration 01/17/2018   Senile osteoporosis 01/17/2018   Obstructive sleep apnea syndrome 11/10/2015   Bowel habit changes 06/16/2014   BRBPR (bright red blood per rectum) 06/16/2014   Anxiety 05/29/2014    Orientation RESPIRATION BLADDER Height & Weight     Self, Time, Situation, Place  Normal Continent Weight: 55 kg Height:  5\' 1"  (154.9 cm)  BEHAVIORAL SYMPTOMS/MOOD NEUROLOGICAL BOWEL NUTRITION STATUS      Continent Diet (regular)  AMBULATORY STATUS COMMUNICATION OF NEEDS Skin   Extensive Assist Verbally Normal                       Personal Care Assistance  Level of Assistance  Bathing, Dressing Bathing Assistance: Limited assistance   Dressing Assistance: Limited assistance     Functional Limitations Info             SPECIAL CARE FACTORS FREQUENCY  PT (By licensed PT), OT (By licensed OT)     PT Frequency: 5 times per week OT Frequency: 5 times per week            Contractures Contractures Info: Not present    Additional Factors Info  Code Status, Allergies Code Status Info: full code Allergies Info: Cortisone, Penicillins, Valium           Current Medications (09/13/2021):  This is the current hospital active medication list Current Facility-Administered Medications  Medication Dose Route Frequency Provider Last Rate Last Admin   acetaminophen (TYLENOL) tablet 1,000 mg  1,000 mg Oral Q8H PRN Agbata, Tochukwu, MD   1,000 mg at 09/13/21 0855   [START ON 09/14/2021] bisoprolol (ZEBETA) tablet 2.5 mg  2.5 mg Oral Daily Wieting, Richard, MD       citalopram (CELEXA) tablet 10 mg  10 mg Oral Daily 11/14/2021, MD   10 mg at 09/13/21 0855   diphenhydrAMINE (BENADRYL) 12.5 MG/5ML elixir 12.5 mg  12.5 mg Oral QHS PRN 11/13/21, NP       divalproex (DEPAKOTE) DR tablet 125 mg  125 mg Oral Q12H Manuela Schwartz, MD   125 mg at 09/13/21 0855   enoxaparin (LOVENOX) injection  40 mg  40 mg Subcutaneous Q24H Alford Highland, MD   40 mg at 09/12/21 1400   levothyroxine (SYNTHROID) tablet 75 mcg  75 mcg Oral QAC breakfast Agbata, Tochukwu, MD   75 mcg at 09/13/21 0644   losartan (COZAAR) tablet 50 mg  50 mg Oral Daily Alford Highland, MD   50 mg at 09/12/21 1039   multivitamin-lutein (OCUVITE-LUTEIN) capsule 1 capsule  1 capsule Oral Daily Tressie Ellis, RPH   1 capsule at 09/13/21 0855   nystatin (MYCOSTATIN) 100000 UNIT/ML suspension 500,000 Units  5 mL Oral QID Alford Highland, MD   500,000 Units at 09/13/21 0855   ondansetron (ZOFRAN) tablet 4 mg  4 mg Oral Q6H PRN Agbata, Tochukwu, MD       Or   ondansetron (ZOFRAN)  injection 4 mg  4 mg Intravenous Q6H PRN Agbata, Tochukwu, MD       pantoprazole (PROTONIX) EC tablet 40 mg  40 mg Oral Daily Agbata, Tochukwu, MD   40 mg at 09/13/21 0855   polyethylene glycol (MIRALAX / GLYCOLAX) packet 17 g  17 g Oral Daily PRN Agbata, Tochukwu, MD   17 g at 09/13/21 0856   polyvinyl alcohol (LIQUIFILM TEARS) 1.4 % ophthalmic solution 1 drop  1 drop Both Eyes PRN Tressie Ellis, RPH       simethicone (MYLICON) chewable tablet 80 mg  80 mg Oral Daily PRN Agbata, Tochukwu, MD         Discharge Medications: Please see discharge summary for a list of discharge medications.  Relevant Imaging Results:  Relevant Lab Results:   Additional Information SS# [id number], fully covid vaccinated  Barrie Dunker, RN

## 2021-09-14 LAB — BASIC METABOLIC PANEL
Anion gap: 6 (ref 5–15)
BUN: 12 mg/dL (ref 8–23)
CO2: 31 mmol/L (ref 22–32)
Calcium: 8.7 mg/dL — ABNORMAL LOW (ref 8.9–10.3)
Chloride: 95 mmol/L — ABNORMAL LOW (ref 98–111)
Creatinine, Ser: 0.7 mg/dL (ref 0.44–1.00)
GFR, Estimated: >60 mL/min (ref >60)
Glucose, Bld: 94 mg/dL (ref 70–99)
Potassium: 4.1 mmol/L (ref 3.5–5.1)
Sodium: 132 mmol/L — ABNORMAL LOW (ref 135–145)

## 2021-09-14 LAB — CULTURE, BLOOD (ROUTINE X 2)
Culture: NO GROWTH
Culture: NO GROWTH
Special Requests: ADEQUATE
Special Requests: ADEQUATE

## 2021-09-14 LAB — RESP PANEL BY RT-PCR (FLU A&B, COVID) ARPGX2
Influenza A by PCR: NEGATIVE
Influenza B by PCR: NEGATIVE
SARS Coronavirus 2 by RT PCR: NEGATIVE

## 2021-09-14 MED ORDER — BISACODYL 10 MG RE SUPP
10.0000 mg | Freq: Every day | RECTAL | Status: DC | PRN
  Filled 2021-09-14: qty 1

## 2021-09-14 MED ORDER — DIVALPROEX SODIUM 125 MG PO DR TAB: 125.0000 mg | DELAYED_RELEASE_TABLET | Freq: Two times a day (BID) | ORAL | 1 refills | Status: DC

## 2021-09-14 MED ORDER — BISACODYL 10 MG RE SUPP: 10.0000 mg | Freq: Every day | RECTAL | 0 refills | Status: DC | PRN

## 2021-09-14 MED ORDER — BISOPROLOL FUMARATE 5 MG PO TABS: 2.5000 mg | ORAL_TABLET | Freq: Every day | ORAL | 1 refills | Status: DC

## 2021-09-14 NOTE — Discharge Summary (Signed)
[facility] [place] [facility]   PATIENT NAME: Briana Lozano    MR#:  [id number]  DATE OF BIRTH:  14-May-1929  DATE OF ADMISSION:  09/09/2021 ADMITTING PHYSICIAN: Alford Highland, MD  DATE OF DISCHARGE: 09/14/2021  PRIMARY CARE PHYSICIAN: Dorothey Baseman, MD    ADMISSION DIAGNOSIS:  Hyponatremia [E87.1] Brain aneurysm [I67.1] Episode of unresponsiveness [R41.89] Fall, initial encounter [W19.XXXA] Headache [R51.9] Altered mental status, unspecified altered mental status type [R41.82] Altered mental status, unspecified [R41.82]  DISCHARGE DIAGNOSIS:  Hyponatremia--improving H/o ICA anuerysm Headache  SECONDARY DIAGNOSIS:   Past Medical History:  Diagnosis Date  . Arthritis    fingers  . Brain aneurysm   . COPD (chronic obstructive pulmonary disease) (HCC)    wheezing occasionally  . COVID-19 12/19/2019   mild symptoms  . Depression   . Dyspnea    with exertion  . GERD (gastroesophageal reflux disease)   . Headache   . Heart murmur    followed by PCP  . History of hiatal hernia   . History of phlebitis   . Hypertension   . Hypothyroidism   . Osteoporosis   . Pneumonia    in PAST  . Sleep apnea    no CPAP  . Vertigo    in past  . Wears dentures    full upper    HOSPITAL COURSE:  Briana Lozano is a 87 y.o. female with medical history significant for COPD, GERD, hypertension, depression who presents to the emergency room via EMS for evaluation of an unwitnessed fall. Patient states that she was seen in the emergency room 2 days prior to this admission for evaluation of nausea, headache and poor oral intake CT angiogram of the head and neck shows partially thrombosed right ICA aneurysm with filling portion measuring up to 1.5 cm.  No intracranial arterial occlusion or high-grade stenosis. Cervical spine CT shows no acute fracture or subluxation of the cervical spine  acute hyponatremia with generalized weakness -- patient  sodium improved after IV fluids and holding hydrochlorothiazide -- mentation is at baseline. Sodium is 132 -- patient was seen by nephrology. -- Physical therapy evaluated patient recommends rehab. [facility]  History of fall at home -- PT recommends rehab  headache with history of intracranial aneurysm  -- patient was seen by neurosurgery-- recommends outpatient follow-up -- patient was seen by neurology Dr. Selina Cooley-- started on prophylactic depakote menstrual follow-up with Dr. Conchita Paris as outpatient who has seen patient before.  Hypothyroidism -- continue Synthroid  Hypertension -- discontinue hydrochlorothiazide -- continue low-dose metoprolol and losartan  Constipation -- PRN Dulcolax and MiraLAX  overall improving slowly. Patient will discharged to rehab today. Discharge plan was discussed with patient was in agreement. Son Briana Lozano is informed of the plan and in agreement  CONSULTS OBTAINED:    DRUG ALLERGIES:   Allergies  Allergen Reactions  . Cortisone Swelling       . Penicillins Swelling    Did it involve swelling of the face/tongue/throat, SOB, or low BP? No Did it involve sudden or severe rash/hives, skin peeling, or any reaction on the inside of your mouth or nose? No Did you need to seek medical attention at a hospital or doctor's office? No When did it last happen?      7-8 years If all above answers are "NO", may proceed with cephalosporin use.   . Valium [Diazepam] Other (See Comments)    Couldn't walk    DISCHARGE MEDICATIONS:  Allergies as of 09/14/2021       Reactions   Cortisone Swelling       Penicillins Swelling   Did it involve swelling of the face/tongue/throat, SOB, or low BP? No Did it involve sudden or severe rash/hives, skin peeling, or any reaction on the inside of your mouth or nose? No Did you need to seek medical attention at a hospital or doctor's office? No When did it last happen?      7-8  years If all above answers are "NO", may proceed with cephalosporin use.   Valium [diazepam] Other (See Comments)   Couldn't walk        Medication List     STOP taking these medications    aspirin EC 81 MG tablet   azithromycin 250 MG tablet Commonly known as: Zithromax Z-Pak   bisoprolol-hydrochlorothiazide 5-6.25 MG tablet Commonly known as: ZIAC   cefpodoxime 200 MG tablet Commonly known as: VANTIN   famotidine 20 MG tablet Commonly known as: PEPCID   GOODYS PM PO   guaiFENesin 600 MG 12 hr tablet Commonly known as: MUCINEX   pantoprazole 40 MG tablet Commonly known as: PROTONIX       TAKE these medications    acetaminophen 650 MG CR tablet Commonly known as: TYLENOL Take 1,000 mg by mouth every 8 (eight) hours as needed for pain.   bisacodyl 10 MG suppository Commonly known as: DULCOLAX Place 1 suppository (10 mg total) rectally daily as needed for moderate constipation.   bisoprolol 5 MG tablet Commonly known as: ZEBETA Take 0.5 tablets (2.5 mg total) by mouth daily.   citalopram 20 MG tablet Commonly known as: CELEXA Take 20 mg by mouth daily.   cyanocobalamin 1000 MCG/ML injection Commonly known as: (VITAMIN B-12) Inject 1,000 mcg into the muscle every 30 (thirty) days.   divalproex 125 MG DR tablet Commonly known as: DEPAKOTE Take 1 tablet (125 mg total) by mouth every 12 (twelve) hours.   levothyroxine 75 MCG tablet Commonly known as: SYNTHROID Take 75 mcg by mouth daily before breakfast. What changed: Another medication with the same name was removed. Continue taking this medication, and follow the directions you see here.   loratadine 10 MG tablet Commonly known as: CLARITIN Take 10 mg by mouth daily.   losartan 50 MG tablet Commonly known as: COZAAR Take 50 mg by mouth daily. am   multivitamin with minerals Tabs tablet Take 1 tablet by mouth daily.   ondansetron 4 MG disintegrating tablet Commonly known as: Zofran ODT Take  1 tablet (4 mg total) by mouth every 8 (eight) hours as needed for nausea or vomiting.   Phazyme Maximum Strength 250 MG Caps Generic drug: Simethicone Take 250-500 mg by mouth daily as needed (gas).   polyethylene glycol 17 g packet Commonly known as: MIRALAX / GLYCOLAX Take 17 g by mouth daily as needed for mild constipation.   PRESERVISION AREDS 2 PO Take 1 capsule by mouth 2 (two) times daily.   SYSTANE OP Place 1 drop into both eyes daily as needed (dry eyes).               Durable Medical Equipment  (From admission, onward)           Start     Ordered   09/10/21 1630  For home use only DME 3 n 1  Once        09/10/21 1629   09/10/21 1629  For home use only DME Walker rolling  Once       Question Answer Comment  Walker: With 5 Inch Wheels   Patient needs a walker to treat with the following condition Unsteady gait when walking      09/10/21 1629            If you experience worsening of your admission symptoms, develop shortness of breath, life threatening emergency, suicidal or homicidal thoughts you must seek medical attention immediately by calling 911 or calling your MD immediately  if symptoms less severe.  You Must read complete instructions/literature along with all the possible adverse reactions/side effects for all the Medicines you take and that have been prescribed to you. Take any new Medicines after you have completely understood and accept all the possible adverse reactions/side effects.   Please note  You were cared for by a hospitalist during your hospital stay. If you have any questions about your discharge medications or the care you received while you were in the hospital after you are discharged, you can call the unit and asked to speak with the hospitalist on call if the hospitalist that took care of you is not available. Once you are discharged, your primary care physician will handle any further medical issues. Please note that NO  REFILLS for any discharge medications will be authorized once you are discharged, as it is imperative that you return to your primary care physician (or establish a relationship with a primary care physician if you do not have one) for your aftercare needs so that they can reassess your need for medications and monitor your lab values. Today   SUBJECTIVE  I need to have BM Eating well   VITAL SIGNS:  Blood pressure (!) 157/72, pulse (!) 57, temperature 98.1 F (36.7 C), temperature source Oral, resp. rate 20, height 5\' 1"  (1.549 m), weight 55 kg, SpO2 97 %.  I/O:  No intake or output data in the 24 hours ending 09/14/21 1108  PHYSICAL EXAMINATION:  GENERAL:  87 y.o.-year-old patient lying in the bed with no acute distress.  LUNGS: Normal breath sounds bilaterally, no wheezing, rales,rhonchi or crepitation. No use of accessory muscles of respiration.  CARDIOVASCULAR: S1, S2 normal. No murmurs, rubs, or gallops.  ABDOMEN: Soft, non-tender, non-distended. Bowel sounds present. No organomegaly or mass.  EXTREMITIES: No pedal edema, cyanosis, or clubbing.  NEUROLOGIC:grossly nonfocal PSYCHIATRIC: The patient is alert and oriented x 3.  SKIN: No obvious rash, lesion, or ulcer.   DATA REVIEW:   CBC  Recent Labs  Lab 09/10/21 0632  WBC 9.3  HGB 14.1  HCT 38.3  PLT 179    Chemistries  Recent Labs  Lab 09/09/21 0517 09/09/21 2004 09/14/21 0312  NA 121*   < > 132*  K 3.0*   < > 4.1  CL 85*   < > 95*  CO2 29   < > 31  GLUCOSE 107*   < > 94  BUN 14   < > 12  CREATININE 0.63   < > 0.70  CALCIUM 8.4*   < > 8.7*  MG 1.7  --   --   AST 47*  --   --   ALT 23  --   --   ALKPHOS 36*  --   --   BILITOT 1.1  --   --    < > = values in this interval not displayed.    Microbiology Results   Recent Results (from the past 240 hour(s))  Resp Panel by RT-PCR (Flu  A&B, Covid) Nasopharyngeal Swab     Status: None   Collection Time: 09/07/21 10:45 AM   Specimen: Nasopharyngeal Swab;  Nasopharyngeal(NP) swabs in vial transport medium  Result Value Ref Range Status   SARS Coronavirus 2 by RT PCR NEGATIVE NEGATIVE Final    Comment: (NOTE) SARS-CoV-2 target nucleic acids are NOT DETECTED.  The SARS-CoV-2 RNA is generally detectable in upper respiratory specimens during the acute phase of infection. The lowest concentration of SARS-CoV-2 viral copies this assay can detect is 138 copies/mL. A negative result does not preclude SARS-Cov-2 infection and should not be used as the sole basis for treatment or other patient management decisions. A negative result may occur with  improper specimen collection/handling, submission of specimen other than nasopharyngeal swab, presence of viral mutation(s) within the areas targeted by this assay, and inadequate number of viral copies(<138 copies/mL). A negative result must be combined with clinical observations, patient history, and epidemiological information. The expected result is Negative.  Fact Sheet for Patients:  BloggerCourse.com  Fact Sheet for Healthcare Providers:  SeriousBroker.it  This test is no t yet approved or cleared by the [place] FDA and  has been authorized for detection and/or diagnosis of SARS-CoV-2 by FDA under an Emergency Use Authorization (EUA). This EUA will remain  in effect (meaning this test can be used) for the duration of the COVID-19 declaration under Section 564(b)(1) of the Act, 21 U.S.C.section 360bbb-3(b)(1), unless the authorization is terminated  or revoked sooner.       Influenza A by PCR NEGATIVE NEGATIVE Final   Influenza B by PCR NEGATIVE NEGATIVE Final    Comment: (NOTE) The Xpert Xpress SARS-CoV-2/FLU/RSV plus assay is intended as an aid in the diagnosis of influenza from Nasopharyngeal swab specimens and should not be used as a sole basis for treatment. Nasal washings and aspirates are unacceptable for Xpert Xpress  SARS-CoV-2/FLU/RSV testing.  Fact Sheet for Patients: BloggerCourse.com  Fact Sheet for Healthcare Providers: SeriousBroker.it  This test is not yet approved or cleared by the [place] FDA and has been authorized for detection and/or diagnosis of SARS-CoV-2 by FDA under an Emergency Use Authorization (EUA). This EUA will remain in effect (meaning this test can be used) for the duration of the COVID-19 declaration under Section 564(b)(1) of the Act, 21 U.S.C. section 360bbb-3(b)(1), unless the authorization is terminated or revoked.  Performed at [facility], [address] [place], [place] [address]   Culture, blood (Routine X 2) w Reflex to ID Panel     Status: None   Collection Time: 09/09/21  2:12 AM   Specimen: BLOOD  Result Value Ref Range Status   Specimen Description BLOOD LEFT ASSIST CONTROL  Final   Special Requests   Final    BOTTLES DRAWN AEROBIC AND ANAEROBIC Blood Culture adequate volume   Culture   Final    NO GROWTH 5 DAYS Performed at [facility], [address] [place], [place] [address]    Report Status 09/14/2021 FINAL  Final  Culture, blood (Routine X 2) w Reflex to ID Panel     Status: None   Collection Time: 09/09/21  2:12 AM   Specimen: BLOOD  Result Value Ref Range Status   Specimen Description BLOOD RIGHT ASSIST CONTROL  Final   Special Requests   Final    BOTTLES DRAWN AEROBIC AND ANAEROBIC Blood Culture adequate volume   Culture   Final    NO GROWTH 5 DAYS Performed at [facility], [address] [place], [place] [address]    Report Status 09/14/2021 FINAL  Final  Resp Panel by RT-PCR (Flu A&B, Covid) Nasopharyngeal Swab     Status: None   Collection Time: 09/09/21  2:12 AM   Specimen: Nasopharyngeal Swab; Nasopharyngeal(NP) swabs in vial transport medium  Result Value Ref Range Status   SARS Coronavirus 2 by RT PCR NEGATIVE NEGATIVE Final    Comment:  (NOTE) SARS-CoV-2 target nucleic acids are NOT DETECTED.  The SARS-CoV-2 RNA is generally detectable in upper respiratory specimens during the acute phase of infection. The lowest concentration of SARS-CoV-2 viral copies this assay can detect is 138 copies/mL. A negative result does not preclude SARS-Cov-2 infection and should not be used as the sole basis for treatment or other patient management decisions. A negative result may occur with  improper specimen collection/handling, submission of specimen other than nasopharyngeal swab, presence of viral mutation(s) within the areas targeted by this assay, and inadequate number of viral copies(<138 copies/mL). A negative result must be combined with clinical observations, patient history, and epidemiological information. The expected result is Negative.  Fact Sheet for Patients:  BloggerCourse.com  Fact Sheet for Healthcare Providers:  SeriousBroker.it  This test is no t yet approved or cleared by the [place] FDA and  has been authorized for detection and/or diagnosis of SARS-CoV-2 by FDA under an Emergency Use Authorization (EUA). This EUA will remain  in effect (meaning this test can be used) for the duration of the COVID-19 declaration under Section 564(b)(1) of the Act, 21 U.S.C.section 360bbb-3(b)(1), unless the authorization is terminated  or revoked sooner.       Influenza A by PCR NEGATIVE NEGATIVE Final   Influenza B by PCR NEGATIVE NEGATIVE Final    Comment: (NOTE) The Xpert Xpress SARS-CoV-2/FLU/RSV plus assay is intended as an aid in the diagnosis of influenza from Nasopharyngeal swab specimens and should not be used as a sole basis for treatment. Nasal washings and aspirates are unacceptable for Xpert Xpress SARS-CoV-2/FLU/RSV testing.  Fact Sheet for Patients: BloggerCourse.com  Fact Sheet for Healthcare  Providers: SeriousBroker.it  This test is not yet approved or cleared by the [place] FDA and has been authorized for detection and/or diagnosis of SARS-CoV-2 by FDA under an Emergency Use Authorization (EUA). This EUA will remain in effect (meaning this test can be used) for the duration of the COVID-19 declaration under Section 564(b)(1) of the Act, 21 U.S.C. section 360bbb-3(b)(1), unless the authorization is terminated or revoked.  Performed at [facility], [address], [place], [place] [address]     RADIOLOGY:  No results found.   CODE STATUS:     Code Status Orders  (From admission, onward)           Start     Ordered   09/09/21 0959  Full code  Continuous        09/09/21 0959           Code Status History     Date Active Date Inactive Code Status Order ID Comments User Context   04/17/2017 1320 04/18/2017 0312 Full Code [id number]  Lisbeth Renshaw, MD HOV      Advance Directive Documentation    Flowsheet Row Most Recent Value  Type of Advance Directive Healthcare Power of Attorney  Pre-existing out of facility DNR order (yellow form or pink MOST form) --  "MOST" Form in Place? --        TOTAL TIME TAKING CARE OF THIS PATIENT: 40  minutes.    Enedina Finner M.D  [facility]    CC: Primary care physician; Dorothey Baseman, MD

## 2021-09-14 NOTE — Progress Notes (Signed)
[facility]  ROUNDING NOTE   Subjective:   Patient seen resting in bed Son at bedside Tolerating small meals Complains of constipation  Objective:  Vital signs in last 24 hours:  Temp:  [97.7 F (36.5 C)-98.3 F (36.8 C)] 98.1 F (36.7 C) (10/05 0841) Pulse Rate:  [52-59] 57 (10/05 0841) Resp:  [16-20] 20 (10/05 0841) BP: (128-170)/(65-78) 157/72 (10/05 0841) SpO2:  [93 %-97 %] 97 % (10/05 0520)  Weight change:  Filed Weights   09/09/21 0208  Weight: 55 kg    Intake/Output: No intake/output data recorded.   Intake/Output this shift:  No intake/output data recorded.  Physical Exam: General: NAD, laying in bed  Head: Normocephalic, atraumatic. Moist oral mucosal membranes  Eyes: Anicteric  Lungs:  Clear to auscultation, normal effort  Heart: Regular rate and rhythm  Abdomen:  Soft, nontender  Extremities:  no peripheral edema.  Neurologic: Nonfocal, moving all four extremities  Skin: No lesions   Basic Metabolic Panel: Recent Labs  Lab 09/09/21 0517 09/09/21 2004 09/10/21 7867 09/11/21 0504 09/12/21 0431 09/13/21 0416 09/14/21 0312  NA 121*   < > 132* 131* 128* 130* 132*  K 3.0*   < > 4.4 3.4* 3.9 4.0 4.1  CL 85*   < > 97* 92* 94* 97* 95*  CO2 29   < > 28 28 28 26 31   GLUCOSE 107*   < > 95 94 93 96 94  BUN 14   < > 8 8 11 11 12   CREATININE 0.63   < > 0.66 0.58 0.54 0.67 0.70  CALCIUM 8.4*   < > 8.5* 9.2 8.7* 8.7* 8.7*  MG 1.7  --   --   --   --   --   --    < > = values in this interval not displayed.     Liver Function Tests: Recent Labs  Lab 09/09/21 0517  AST 47*  ALT 23  ALKPHOS 36*  BILITOT 1.1  PROT 6.2*  ALBUMIN 3.4*    No results for input(s): LIPASE, AMYLASE in the last 168 hours.  Recent Labs  Lab 09/09/21 0212  AMMONIA 10     CBC: Recent Labs  Lab 09/09/21 0517 09/10/21 0632  WBC 10.0 9.3  NEUTROABS 7.3  --   HGB 14.1 14.1  HCT RESULTS UNAVAILABLE DUE TO INTERFERING SUBSTANCE 38.3  MCV RESULTS  UNAVAILABLE DUE TO INTERFERING SUBSTANCE 96.5  PLT 201 179     Cardiac Enzymes: No results for input(s): CKTOTAL, CKMB, CKMBINDEX, TROPONINI in the last 168 hours.  BNP: Invalid input(s): POCBNP  CBG: No results for input(s): GLUCAP in the last 168 hours.  Microbiology: Results for orders placed or performed during the hospital encounter of 09/09/21  Culture, blood (Routine X 2) w Reflex to ID Panel     Status: None   Collection Time: 09/09/21  2:12 AM   Specimen: BLOOD  Result Value Ref Range Status   Specimen Description BLOOD LEFT ASSIST CONTROL  Final   Special Requests   Final    BOTTLES DRAWN AEROBIC AND ANAEROBIC Blood Culture adequate volume   Culture   Final    NO GROWTH 5 DAYS Performed at [facility], [address], [place], [address] [place]    Report Status 09/14/2021 FINAL  Final  Culture, blood (Routine X 2) w Reflex to ID Panel     Status: None   Collection Time: 09/09/21  2:12 AM   Specimen: BLOOD  Result Value  Ref Range Status   Specimen Description BLOOD RIGHT ASSIST CONTROL  Final   Special Requests   Final    BOTTLES DRAWN AEROBIC AND ANAEROBIC Blood Culture adequate volume   Culture   Final    NO GROWTH 5 DAYS Performed at [facility], [address] [place], [place] [address]    Report Status 09/14/2021 FINAL  Final  Resp Panel by RT-PCR (Flu A&B, Covid) Nasopharyngeal Swab     Status: None   Collection Time: 09/09/21  2:12 AM   Specimen: Nasopharyngeal Swab; Nasopharyngeal(NP) swabs in vial transport medium  Result Value Ref Range Status   SARS Coronavirus 2 by RT PCR NEGATIVE NEGATIVE Final    Comment: (NOTE) SARS-CoV-2 target nucleic acids are NOT DETECTED.  The SARS-CoV-2 RNA is generally detectable in upper respiratory specimens during the acute phase of infection. The lowest concentration of SARS-CoV-2 viral copies this assay can detect is 138 copies/mL. A negative result does not preclude SARS-Cov-2 infection  and should not be used as the sole basis for treatment or other patient management decisions. A negative result may occur with  improper specimen collection/handling, submission of specimen other than nasopharyngeal swab, presence of viral mutation(s) within the areas targeted by this assay, and inadequate number of viral copies(<138 copies/mL). A negative result must be combined with clinical observations, patient history, and epidemiological information. The expected result is Negative.  Fact Sheet for Patients:  BloggerCourse.com  Fact Sheet for Healthcare Providers:  SeriousBroker.it  This test is no t yet approved or cleared by the [place] FDA and  has been authorized for detection and/or diagnosis of SARS-CoV-2 by FDA under an Emergency Use Authorization (EUA). This EUA will remain  in effect (meaning this test can be used) for the duration of the COVID-19 declaration under Section 564(b)(1) of the Act, 21 U.S.C.section 360bbb-3(b)(1), unless the authorization is terminated  or revoked sooner.       Influenza A by PCR NEGATIVE NEGATIVE Final   Influenza B by PCR NEGATIVE NEGATIVE Final    Comment: (NOTE) The Xpert Xpress SARS-CoV-2/FLU/RSV plus assay is intended as an aid in the diagnosis of influenza from Nasopharyngeal swab specimens and should not be used as a sole basis for treatment. Nasal washings and aspirates are unacceptable for Xpert Xpress SARS-CoV-2/FLU/RSV testing.  Fact Sheet for Patients: BloggerCourse.com  Fact Sheet for Healthcare Providers: SeriousBroker.it  This test is not yet approved or cleared by the [place] FDA and has been authorized for detection and/or diagnosis of SARS-CoV-2 by FDA under an Emergency Use Authorization (EUA). This EUA will remain in effect (meaning this test can be used) for the duration of the COVID-19 declaration  under Section 564(b)(1) of the Act, 21 U.S.C. section 360bbb-3(b)(1), unless the authorization is terminated or revoked.  Performed at [facility], [address] [place], [place] [address]     Coagulation Studies: No results for input(s): LABPROT, INR in the last 72 hours.  Urinalysis: No results for input(s): COLORURINE, LABSPEC, PHURINE, GLUCOSEU, HGBUR, BILIRUBINUR, KETONESUR, PROTEINUR, UROBILINOGEN, NITRITE, LEUKOCYTESUR in the last 72 hours.  Invalid input(s): APPERANCEUR    Imaging: No results found.   Medications:     bisoprolol  2.5 mg Oral Daily   citalopram  10 mg Oral Daily   divalproex  125 mg Oral Q12H   enoxaparin (LOVENOX) injection  40 mg Subcutaneous Q24H   levothyroxine  75 mcg Oral QAC breakfast   losartan  50 mg Oral Daily   multivitamin-lutein  1 capsule Oral Daily   nystatin  5 mL Oral QID   pantoprazole  40 mg Oral Daily   acetaminophen, bisacodyl, diphenhydrAMINE, ondansetron **OR** ondansetron (ZOFRAN) IV, polyethylene glycol, polyvinyl alcohol, simethicone  Assessment/ Plan:  Ms. Briana Lozano is a 87 y.o.  female with medical history of GERD, COPD, and hypertension, who was admitted to [facility] on 09/09/2021 for Hyponatremia [E87.1]    Hyponatremia secondary hypovolemia and continued hydrochlorothiazide use. Received IVF and responded well. Sodium improved to 132 today. Patient encouraged to eat meals. Will continue to monitor.   Hypertension Home regimen includes Losartan and bisoprolol-hydrochlorothiazide. Diuretics held at this time. BP improved for this patient to 157/72   LOS: 5 Lavana Huckeba 10/5/202211:31 AM

## 2021-09-14 NOTE — Plan of Care (Signed)
  Problem: Education: Goal: Knowledge of General Education information will improve Description: Including pain rating scale, medication(s)/side effects and non-pharmacologic comfort measures Outcome: Progressing   Problem: Activity: Goal: Risk for activity intolerance will decrease Outcome: Progressing   Problem: Elimination: Goal: Will not experience complications related to bowel motility Outcome: Progressing Goal: Will not experience complications related to urinary retention Outcome: Progressing   Problem: Pain Managment: Goal: General experience of comfort will improve Outcome: Progressing   Problem: Safety: Goal: Ability to remain free from injury will improve Outcome: Progressing   Problem: Skin Integrity: Goal: Risk for impaired skin integrity will decrease Outcome: Progressing   

## 2021-09-14 NOTE — TOC Progression Note (Addendum)
Transition of Care [facility]) - Progression Note    Patient Details  Name: Briana Lozano MRN: [id number] Date of Birth: 07-13-1929  Transition of Care [facility]) CM/SW [place], RN Phone Number: 09/14/2021, 10:36 AM  Clinical Narrative:   Met with the patient and her son at the bedside, Reviewed the bed choices and the star rating, she chose to go to [facility], I notified Tina at [facility], Uploaded clinical documents to [place] [facility] to get ins approval, She has had all 4 covid vaccination, the patient has received notification of insurance approval ref number [id number]    Expected Discharge Plan: [place] Barriers to Discharge: Barriers Resolved  Expected Discharge Plan and Services Expected Discharge Plan: [place]   Discharge Planning Services: CM Consult   Living arrangements for the past 2 months: Single Family Home                 DME Arranged: 3-N-1 DME Agency: [employer] Date DME Agency Contacted: 09/12/21 Time DME Agency Contacted: 36 Representative spoke with at DME Agency: [facility] HH Arranged: PT, OT, RN [place] Agency: [place] Date [place]: 09/12/21 Time [place]: 1050 Representative spoke with at [place]: [place] Interventions    Readmission Risk Interventions No flowsheet data found.

## 2021-09-14 NOTE — Progress Notes (Signed)
AVS and social worker's paperwork placed in discharge packet. Report called and given to [facility], LPN at [facility]. All questions answered to satisfaction. Awaiting [facility] to transport pt with all belongings.

## 2021-09-14 NOTE — TOC Progression Note (Signed)
Transition of Care [facility]) - Progression Note    Patient Details  Name: Briana Lozano MRN: [id number] Date of Birth: [birth year]  Transition of Care [facility]) CM/SW Contact  Briana Dunker, RN Phone Number: 09/14/2021, 12:12 PM  Clinical Narrative:     Patient to go to room 612A at [facility], to be picked up at 430 PM by [facility] Son Briana Lozano aware  Expected Discharge Plan: Home w Home Health Services Barriers to Discharge: Barriers Resolved  Expected Discharge Plan and Services Expected Discharge Plan: Home w Home Health Services   Discharge Planning Services: CM Consult   Living arrangements for the past 2 months: Single Family Home Expected Discharge Date: 09/14/21               DME Arranged: 3-N-1 DME Agency: [employer] Date DME Agency Contacted: 09/12/21 Time DME Agency Contacted: 1050 Representative spoke with at DME Agency: [facility] HH Arranged: PT, OT, RN HH Agency: [facility] Date [place] Agency Contacted: 09/12/21 Time HH Agency Contacted: 1050 Representative spoke with at [facility] Agency: Kandee Keen   Social Determinants of Health (SDOH) Interventions    Readmission Risk Interventions No flowsheet data found.

## 2021-09-19 ENCOUNTER — Inpatient Hospital Stay
Admission: EM | Admit: 2021-09-19 | Discharge: 2021-09-23 | DRG: 312 | Disposition: A | Discharge status: Skilled Nursing Facility | Payer: Medicare Other | Source: Skilled Nursing Facility | Attending: Internal Medicine | Admitting: Internal Medicine

## 2021-09-19 ENCOUNTER — Other Ambulatory Visit: Payer: Self-pay

## 2021-09-19 ENCOUNTER — Emergency Department: Payer: Medicare Other

## 2021-09-19 ENCOUNTER — Observation Stay: Payer: Medicare Other

## 2021-09-19 DIAGNOSIS — R471 Dysarthria and anarthria: Secondary | ICD-10-CM | POA: Diagnosis present

## 2021-09-19 DIAGNOSIS — F32A Depression, unspecified: Secondary | ICD-10-CM | POA: Diagnosis present

## 2021-09-19 DIAGNOSIS — R29810 Facial weakness: Secondary | ICD-10-CM | POA: Diagnosis present

## 2021-09-19 DIAGNOSIS — Z79890 Hormone replacement therapy (postmenopausal): Secondary | ICD-10-CM

## 2021-09-19 DIAGNOSIS — Z8616 Personal history of COVID-19: Secondary | ICD-10-CM

## 2021-09-19 DIAGNOSIS — J449 Chronic obstructive pulmonary disease, unspecified: Secondary | ICD-10-CM | POA: Diagnosis present

## 2021-09-19 DIAGNOSIS — Z9842 Cataract extraction status, left eye: Secondary | ICD-10-CM

## 2021-09-19 DIAGNOSIS — I671 Cerebral aneurysm, nonruptured: Secondary | ICD-10-CM | POA: Diagnosis present

## 2021-09-19 DIAGNOSIS — Z8673 Personal history of transient ischemic attack (TIA), and cerebral infarction without residual deficits: Secondary | ICD-10-CM

## 2021-09-19 DIAGNOSIS — F419 Anxiety disorder, unspecified: Secondary | ICD-10-CM | POA: Diagnosis present

## 2021-09-19 DIAGNOSIS — Z20822 Contact with and (suspected) exposure to covid-19: Secondary | ICD-10-CM | POA: Diagnosis present

## 2021-09-19 DIAGNOSIS — Z9049 Acquired absence of other specified parts of digestive tract: Secondary | ICD-10-CM

## 2021-09-19 DIAGNOSIS — R251 Tremor, unspecified: Secondary | ICD-10-CM | POA: Diagnosis present

## 2021-09-19 DIAGNOSIS — I951 Orthostatic hypotension: Principal | ICD-10-CM | POA: Diagnosis present

## 2021-09-19 DIAGNOSIS — K449 Diaphragmatic hernia without obstruction or gangrene: Secondary | ICD-10-CM

## 2021-09-19 DIAGNOSIS — Z9841 Cataract extraction status, right eye: Secondary | ICD-10-CM

## 2021-09-19 DIAGNOSIS — R300 Dysuria: Secondary | ICD-10-CM

## 2021-09-19 DIAGNOSIS — G4730 Sleep apnea, unspecified: Secondary | ICD-10-CM | POA: Diagnosis present

## 2021-09-19 DIAGNOSIS — N390 Urinary tract infection, site not specified: Secondary | ICD-10-CM

## 2021-09-19 DIAGNOSIS — Z79899 Other long term (current) drug therapy: Secondary | ICD-10-CM

## 2021-09-19 DIAGNOSIS — Z888 Allergy status to other drugs, medicaments and biological substances status: Secondary | ICD-10-CM

## 2021-09-19 DIAGNOSIS — R531 Weakness: Secondary | ICD-10-CM

## 2021-09-19 DIAGNOSIS — G4733 Obstructive sleep apnea (adult) (pediatric): Secondary | ICD-10-CM | POA: Diagnosis present

## 2021-09-19 DIAGNOSIS — Z96641 Presence of right artificial hip joint: Secondary | ICD-10-CM | POA: Diagnosis present

## 2021-09-19 DIAGNOSIS — R112 Nausea with vomiting, unspecified: Secondary | ICD-10-CM

## 2021-09-19 DIAGNOSIS — M810 Age-related osteoporosis without current pathological fracture: Secondary | ICD-10-CM | POA: Diagnosis present

## 2021-09-19 DIAGNOSIS — Z23 Encounter for immunization: Secondary | ICD-10-CM

## 2021-09-19 DIAGNOSIS — E871 Hypo-osmolality and hyponatremia: Secondary | ICD-10-CM | POA: Diagnosis present

## 2021-09-19 DIAGNOSIS — I639 Cerebral infarction, unspecified: Secondary | ICD-10-CM | POA: Diagnosis present

## 2021-09-19 DIAGNOSIS — G459 Transient cerebral ischemic attack, unspecified: Secondary | ICD-10-CM | POA: Diagnosis not present

## 2021-09-19 DIAGNOSIS — Z961 Presence of intraocular lens: Secondary | ICD-10-CM | POA: Diagnosis present

## 2021-09-19 DIAGNOSIS — G500 Trigeminal neuralgia: Secondary | ICD-10-CM | POA: Diagnosis present

## 2021-09-19 DIAGNOSIS — I10 Essential (primary) hypertension: Secondary | ICD-10-CM | POA: Diagnosis present

## 2021-09-19 DIAGNOSIS — Z885 Allergy status to narcotic agent status: Secondary | ICD-10-CM

## 2021-09-19 DIAGNOSIS — K5900 Constipation, unspecified: Secondary | ICD-10-CM | POA: Diagnosis present

## 2021-09-19 DIAGNOSIS — Z880 Allergy status to penicillin: Secondary | ICD-10-CM

## 2021-09-19 DIAGNOSIS — R519 Headache, unspecified: Secondary | ICD-10-CM

## 2021-09-19 DIAGNOSIS — E039 Hypothyroidism, unspecified: Secondary | ICD-10-CM | POA: Diagnosis present

## 2021-09-19 DIAGNOSIS — H3530 Unspecified macular degeneration: Secondary | ICD-10-CM | POA: Diagnosis present

## 2021-09-19 DIAGNOSIS — K219 Gastro-esophageal reflux disease without esophagitis: Secondary | ICD-10-CM | POA: Diagnosis present

## 2021-09-19 LAB — CBC
HCT: 42.6 % (ref 36.0–46.0)
Hemoglobin: 15.4 g/dL — ABNORMAL HIGH (ref 12.0–15.0)
MCH: 34.7 pg — ABNORMAL HIGH (ref 26.0–34.0)
MCHC: 36.2 g/dL — ABNORMAL HIGH (ref 30.0–36.0)
MCV: 95.9 fL (ref 80.0–100.0)
Platelets: 238 10*3/uL (ref 150–400)
RBC: 4.44 MIL/uL (ref 3.87–5.11)
RDW: 13.1 % (ref 11.5–15.5)
WBC: 9.4 10*3/uL (ref 4.0–10.5)
nRBC: 0 % (ref 0.0–0.2)

## 2021-09-19 LAB — CBC WITH DIFFERENTIAL/PLATELET
Abs Immature Granulocytes: 0.06 10*3/uL (ref 0.00–0.07)
Basophils Absolute: 0 10*3/uL (ref 0.0–0.1)
Basophils Relative: 0 %
Eosinophils Absolute: 0.1 10*3/uL (ref 0.0–0.5)
Eosinophils Relative: 1 %
HCT: 42.8 % (ref 36.0–46.0)
Hemoglobin: 15.6 g/dL — ABNORMAL HIGH (ref 12.0–15.0)
Immature Granulocytes: 0 %
Lymphocytes Relative: 10 %
Lymphs Abs: 1.4 10*3/uL (ref 0.7–4.0)
MCH: 35.1 pg — ABNORMAL HIGH (ref 26.0–34.0)
MCHC: 36.4 g/dL — ABNORMAL HIGH (ref 30.0–36.0)
MCV: 96.4 fL (ref 80.0–100.0)
Monocytes Absolute: 0.7 10*3/uL (ref 0.1–1.0)
Monocytes Relative: 5 %
Neutro Abs: 12.5 10*3/uL — ABNORMAL HIGH (ref 1.7–7.7)
Neutrophils Relative %: 84 %
Platelets: 234 10*3/uL (ref 150–400)
RBC: 4.44 MIL/uL (ref 3.87–5.11)
RDW: 13.2 % (ref 11.5–15.5)
WBC: 14.7 10*3/uL — ABNORMAL HIGH (ref 4.0–10.5)
nRBC: 0 % (ref 0.0–0.2)

## 2021-09-19 LAB — BASIC METABOLIC PANEL
Anion gap: 9 (ref 5–15)
BUN: 10 mg/dL (ref 8–23)
CO2: 28 mmol/L (ref 22–32)
Calcium: 9.1 mg/dL (ref 8.9–10.3)
Chloride: 91 mmol/L — ABNORMAL LOW (ref 98–111)
Creatinine, Ser: 0.58 mg/dL (ref 0.44–1.00)
GFR, Estimated: >60 mL/min (ref >60)
Glucose, Bld: 115 mg/dL — ABNORMAL HIGH (ref 70–99)
Potassium: 3.9 mmol/L (ref 3.5–5.1)
Sodium: 128 mmol/L — ABNORMAL LOW (ref 135–145)

## 2021-09-19 MED ORDER — LOSARTAN POTASSIUM 50 MG PO TABS
50.0000 mg | ORAL_TABLET | Freq: Every day | ORAL | Status: DC
  Administered 2021-09-20: 50 mg via ORAL
  Filled 2021-09-19: qty 1

## 2021-09-19 MED ORDER — BISACODYL 10 MG RE SUPP
10.0000 mg | Freq: Every day | RECTAL | Status: DC | PRN
  Administered 2021-09-21: 10 mg via RECTAL
  Filled 2021-09-19 (×2): qty 1

## 2021-09-19 MED ORDER — BISACODYL 10 MG RE SUPP
10.0000 mg | Freq: Once | RECTAL | Status: AC
  Administered 2021-09-19: 10 mg via RECTAL
  Filled 2021-09-19: qty 1

## 2021-09-19 MED ORDER — CITALOPRAM HYDROBROMIDE 20 MG PO TABS
20.0000 mg | ORAL_TABLET | Freq: Every day | ORAL | Status: DC
  Administered 2021-09-20 – 2021-09-21 (×2): 20 mg via ORAL
  Filled 2021-09-19 (×2): qty 1

## 2021-09-19 MED ORDER — HYDRALAZINE HCL 20 MG/ML IJ SOLN
5.0000 mg | INTRAMUSCULAR | Status: DC | PRN
  Administered 2021-09-19 – 2021-09-20 (×2): 5 mg via INTRAVENOUS
  Filled 2021-09-19 (×2): qty 1

## 2021-09-19 MED ORDER — ACETAMINOPHEN 650 MG RE SUPP: 650.0000 mg | RECTAL | Status: DC | PRN

## 2021-09-19 MED ORDER — POLYETHYLENE GLYCOL 3350 17 G PO PACK
17.0000 g | PACK | Freq: Every day | ORAL | Status: DC
  Administered 2021-09-19 – 2021-09-23 (×5): 17 g via ORAL
  Filled 2021-09-19 (×5): qty 1

## 2021-09-19 MED ORDER — DIVALPROEX SODIUM 125 MG PO DR TAB
125.0000 mg | DELAYED_RELEASE_TABLET | Freq: Two times a day (BID) | ORAL | Status: DC
  Administered 2021-09-19 – 2021-09-21 (×4): 125 mg via ORAL
  Filled 2021-09-19 (×5): qty 1

## 2021-09-19 MED ORDER — STROKE: EARLY STAGES OF RECOVERY BOOK: Freq: Once | Status: AC

## 2021-09-19 MED ORDER — POLYETHYLENE GLYCOL 3350 17 G PO PACK: 17.0000 g | PACK | Freq: Every day | ORAL | Status: DC | PRN

## 2021-09-19 MED ORDER — ADULT MULTIVITAMIN W/MINERALS CH
1.0000 | ORAL_TABLET | Freq: Every day | ORAL | Status: DC
  Administered 2021-09-20 – 2021-09-23 (×4): 1 via ORAL
  Filled 2021-09-19 (×4): qty 1

## 2021-09-19 MED ORDER — ACETAMINOPHEN 500 MG PO TABS
1000.0000 mg | ORAL_TABLET | Freq: Once | ORAL | Status: AC
  Administered 2021-09-19: 1000 mg via ORAL
  Filled 2021-09-19: qty 2

## 2021-09-19 MED ORDER — BISOPROLOL FUMARATE 5 MG PO TABS
2.5000 mg | ORAL_TABLET | Freq: Every day | ORAL | Status: DC
  Administered 2021-09-20: 2.5 mg via ORAL
  Filled 2021-09-19 (×2): qty 0.5

## 2021-09-19 MED ORDER — LEVOTHYROXINE SODIUM 50 MCG PO TABS
75.0000 ug | ORAL_TABLET | Freq: Every day | ORAL | Status: DC
  Administered 2021-09-20 – 2021-09-23 (×4): 75 ug via ORAL
  Filled 2021-09-19 (×2): qty 1
  Filled 2021-09-19: qty 2
  Filled 2021-09-19: qty 1

## 2021-09-19 MED ORDER — SODIUM CHLORIDE 0.9 % IV SOLN: INTRAVENOUS | Status: AC

## 2021-09-19 MED ORDER — ACETAMINOPHEN 160 MG/5ML PO SOLN
650.0000 mg | ORAL | Status: DC | PRN
  Filled 2021-09-19: qty 20.3

## 2021-09-19 MED ORDER — ACETAMINOPHEN 325 MG PO TABS
650.0000 mg | ORAL_TABLET | ORAL | Status: DC | PRN
  Administered 2021-09-19 – 2021-09-23 (×11): 650 mg via ORAL
  Filled 2021-09-19 (×12): qty 2

## 2021-09-19 MED ORDER — SENNA 8.6 MG PO TABS
1.0000 | ORAL_TABLET | Freq: Every day | ORAL | Status: DC
  Administered 2021-09-19 – 2021-09-22 (×4): 8.6 mg via ORAL
  Filled 2021-09-19 (×4): qty 1

## 2021-09-19 NOTE — H&P (Signed)
History and Physical   Briana Lozano PJA:[id number] DOB: Jan 14, 1929 DOA: 09/19/2021  PCP: Dorothey Baseman, MD  Outpatient Specialists: Dr. Norma Fredrickson, GI Patient coming from: Facility  I have personally briefly reviewed patient's old medical records in [facility] EMR.  Chief Concern: Right-sided facial droop  HPI: Briana Lozano is a 87 y.o. right-handed female with medical history significant for GERD-hypertension, hypothyroid, macular degeneration, anxiety, obstructive sleep apnea, who presents to the emergency room from [facility] for chief concerns of headache and right-sided facial droop.  At bedside patient is able to provide me her name, her age, she knows she is at the hospital, and she was able to tell me the current calendar year of 2022.  She reports that she had right-sided numbness that she woke up with.  Per EDP, her son noted that she developed right-sided facial droop and dysarthria.  When I evaluated her her dysarthria had resolved.  She endorsed persistent numbness on the right side of her mouth.  She states that this is new to her.  She states that this is still persistent however it does seem to improve from when she first woke up in the morning.  At bedside she states that she did not have any chest pain, shortness of breath.  She did endorse intermittent abdominal pain.  She states that she had abdominal pain with urination.  She denies nausea, vomiting, cough, fever, chills, chest pain, shortness of breath, diarrhea.  She reports besides the numbness on the right side of her mouth, her constipation is bothering her.  She normally has a bowel movements daily.  She has not had a bowel movement in 2 days and this causes mild discomfort for her.  Social history: Prior to September, she lives at home by herself.  She ambulates around the home without assistance.  Prior to September, she ambulates with a walker outside.  She denies history of tobacco,  EtOH, recreational drug use.  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  Vaccination history: Patient is vaccinated for COVID-19 with 4 doses of Moderna  ROS: Constitutional: no weight change, no fever ENT/Mouth: no sore throat, no rhinorrhea Eyes: no eye pain, no vision changes Cardiovascular: no chest pain, no dyspnea,  no edema, no palpitations Respiratory: no cough, no sputum, no wheezing Gastrointestinal: no nausea, no vomiting, no diarrhea, no constipation Genitourinary: no urinary incontinence, no dysuria, no hematuria Musculoskeletal: no arthralgias, no myalgias Skin: no skin lesions, no pruritus, Neuro: + weakness, no loss of consciousness, no syncope Psych: no anxiety, no depression, no decrease appetite Heme/Lymph: no bruising, no bleeding  ED Course: Discussed with EDP, patient requiring hospitalization for concerns of TIA.  Vitals in the emergency department showed temperature of 98.6, respiration rate of 20, heart rate of 61, initial blood pressure 192/94 and improved to 182/93, SPO2 of 95% on room air.  Labs in the emergency department was remarkable for sodium 128, potassium 3.9, chloride 91, bicarb 28, BUN of 10, serum creatinine of 0.58, nonfasting blood glucose 115, GFR greater than 60, WBC 9.4, hemoglobin 15.4, platelets 238.  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  Assessment/Plan  Principal Problem:   TIA (transient ischemic attack) Active Problems:   Anxiety   Obstructive sleep apnea syndrome  Brain aneurysm   Depression   General weakness   Gastroesophageal reflux disease without esophagitis   Constipation   # Stroke-like symptoms - Neurology has been consulted, Dr. Amada Jupiter via secure chat and we appreciate further  recommendations - Neurology recommends holding antiplatelets at this time - Complete echo ordered - MRI of the brain ordered - Fasting lipid and A1c ordered - Permissive hypertension per neurology recommendations - Frequent neuro vascular checks - PT, OT - Fall precaution and aspiration precaution - Admit to progressive cardiac, observation, telemetry  # Constipation - she reports that prior to getting sick, she had a BM everyday. Her last BM was two days ago. - She formerly took miralax daily and would like this resumed  # Mild hyponatremia - Sodium on presentation is 128 - Sodium chloride 100 mL/h, 10 hours ordered - BMP in the a.m.  # Hypertension-resumed bisoprolol 2.5 mg p.o. daily, losartan 50 mg daily  # Anxiety/depression-resume home citalopram 20 mg daily  # Hypothyroid-resumed levothyroxine 75 mcg daily before breakfast  Chart reviewed.   DVT prophylaxis: SCDs Code Status: Full code Diet: Heart healthy Family Communication: No, family was not at bedside Disposition Plan: Pending clinical course Consults called: Neurology Admission status: Progressive cardiac, observation, telemetry  Past Medical History:  Diagnosis Date   Arthritis    fingers   Brain aneurysm    COPD (chronic obstructive pulmonary disease) (HCC)    wheezing occasionally   COVID-19 12/19/2019   mild symptoms   Depression    Dyspnea    with exertion   GERD (gastroesophageal reflux disease)    Headache    Heart murmur    followed by PCP   History of hiatal hernia    History of phlebitis    Hypertension    Hypothyroidism    Osteoporosis    Pneumonia    in PAST   Sleep apnea    no CPAP   Vertigo    in past   Wears dentures    full upper   Past Surgical History:  Procedure Laterality Date   APPENDECTOMY     BREAST BIOPSY     CATARACT EXTRACTION W/PHACO Left 11/18/2019   Procedure: CATARACT EXTRACTION PHACO AND INTRAOCULAR LENS PLACEMENT (IOC) LEFT 5.77, 00:41.2;  Surgeon:  Galen Manila, MD;  Location: [facility];  Service: Ophthalmology;  Laterality: Left;  sleep apnea   CATARACT EXTRACTION W/PHACO Right 01/20/2020   Procedure: CATARACT EXTRACTION PHACO AND INTRAOCULAR LENS PLACEMENT (IOC) RIGHT;  Surgeon: Galen Manila, MD;  Location: [facility];  Service: Ophthalmology;  Laterality: Right;  Lot #[id number] H [place]: 07:12 CDE: 00:59.3    COLONOSCOPY  2002,2008,2015   EYE SURGERY     HEMORRHOID SURGERY  1950   IR ANGIO INTRA EXTRACRAN SEL COM CAROTID INNOMINATE BILAT MOD SED  04/17/2017   IR ANGIO VERTEBRAL SEL VERTEBRAL UNI L MOD SED  04/17/2017   REPLACEMENT TOTAL KNEE Right 04/2005   Social History:  reports that she has never smoked. She has never used smokeless tobacco. She reports that she does not drink alcohol and does not use drugs.  Allergies  Allergen Reactions   Cortisone Swelling        Penicillins Swelling    Did it involve swelling of the face/tongue/throat, SOB, or low BP? No Did it involve sudden or severe rash/hives, skin peeling, or any reaction on the inside of your mouth or nose? No Did you need to seek medical attention at a hospital or doctor's office? No When  did it last happen?      7-8 years If all above answers are "NO", may proceed with cephalosporin use.    Valium [Diazepam] Other (See Comments)    Couldn't walk   Family History  Problem Relation Age of Onset   Stroke Mother    Lung cancer Father    Lung cancer Brother    Breast cancer Neg Hx    Family history: Family history reviewed and not pertinent  Prior to Admission medications   Medication Sig Start Date End Date Taking? Authorizing Provider  acetaminophen (TYLENOL) 650 MG CR tablet Take 1,000 mg by mouth every 8 (eight) hours as needed for pain.    [provider]  bisacodyl (DULCOLAX) 10 MG suppository Place 1 suppository (10 mg total) rectally daily as needed for moderate constipation. 09/14/21   Enedina Finner, MD  bisoprolol (ZEBETA) 5 MG tablet  Take 0.5 tablets (2.5 mg total) by mouth daily. 09/14/21   Enedina Finner, MD  citalopram (CELEXA) 20 MG tablet Take 20 mg by mouth daily.     [provider]  cyanocobalamin (,VITAMIN B-12,) 1000 MCG/ML injection Inject 1,000 mcg into the muscle every 30 (thirty) days.    [provider]  divalproex (DEPAKOTE) 125 MG DR tablet Take 1 tablet (125 mg total) by mouth every 12 (twelve) hours. 09/14/21   Enedina Finner, MD  levothyroxine (SYNTHROID) 75 MCG tablet Take 75 mcg by mouth daily before breakfast.    [provider]  loratadine (CLARITIN) 10 MG tablet Take 10 mg by mouth daily.    [provider]  losartan (COZAAR) 50 MG tablet Take 50 mg by mouth daily. am    [provider]  Multiple Vitamin (MULTIVITAMIN WITH MINERALS) TABS tablet Take 1 tablet by mouth daily.    [provider]  Multiple Vitamins-Minerals (PRESERVISION AREDS 2 PO) Take 1 capsule by mouth 2 (two) times daily.    [provider]  ondansetron (ZOFRAN ODT) 4 MG disintegrating tablet Take 1 tablet (4 mg total) by mouth every 8 (eight) hours as needed for nausea or vomiting. 09/07/21   Chesley Noon, MD  Polyethyl Glycol-Propyl Glycol (SYSTANE OP) Place 1 drop into both eyes daily as needed (dry eyes).     [provider]  polyethylene glycol (MIRALAX / GLYCOLAX) packet Take 17 g by mouth daily as needed for mild constipation.     [provider]  Simethicone (PHAZYME MAXIMUM STRENGTH) 250 MG CAPS Take 250-500 mg by mouth daily as needed (gas).    [provider]   Physical Exam: Vitals:   09/19/21 1645 09/19/21 1700 09/19/21 2015 09/19/21 2030  BP:  (!) 172/81 (!) 119/57 117/60  Pulse: (!) 58 (!) 58 61 61  Resp:   17 15  Temp:      TempSrc:      SpO2: 96% 95% 97% 95%   Constitutional: appears younger than chronological age, frail, NAD, calm, comfortable Eyes: PERRL, lids and conjunctivae normal ENMT: Mucous membranes are moist. Posterior  pharynx clear of any exudate or lesions. Age-appropriate dentition. Hearing appropriate Neck: normal, supple, no masses, no thyromegaly Respiratory: clear to auscultation bilaterally, no wheezing, no crackles. Normal respiratory effort. No accessory muscle use.  Cardiovascular: Regular rate and rhythm, no murmurs / rubs / gallops. No extremity edema. 2+ pedal pulses. No carotid bruits.  Abdomen: no tenderness, no masses palpated, no hepatosplenomegaly. Bowel sounds positive.  Musculoskeletal: no clubbing / cyanosis. No joint deformity upper and lower extremities. Good ROM,  no contractures, no atrophy. Normal muscle tone.  Skin: no rashes, lesions, ulcers. No induration Neurologic: Sensation intact. Strength 5/5 in all 4.  Psychiatric: Normal judgment and insight. Alert and oriented x 3. Normal mood.   EKG: independently reviewed, showing sinus rhythm with rate of 69, QTc 458  Chest x-ray on Admission: I personally reviewed and I agree with radiologist reading as below.  CT HEAD WO CONTRAST ( )  Result Date: 09/19/2021 CLINICAL DATA:  Headache. EXAM: CT HEAD WITHOUT CONTRAST TECHNIQUE: Contiguous axial images were obtained from the base of the skull through the vertex without intravenous contrast. COMPARISON:  September 09, 2021. FINDINGS: Brain: No mass effect or midline shift is noted. Ventricular size is within normal limits. There is no evidence of mass lesion, hemorrhage or acute infarction. Vascular: There is again noted 3 cm aneurysm involving the intracranial portion of the right internal carotid artery. Skull: Normal. Negative for fracture or focal lesion. Sinuses/Orbits: No acute finding. Other: None. 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 No other intracranial abnormality is noted. Electronically Signed   By: Lupita Raider M.D.   On: 09/19/2021 14:34   MR BRAIN WO  CONTRAST  Result Date: 09/19/2021 CLINICAL DATA:  Neuro deficit, acute, stroke suspected. Right-sided facial droop and dysarthria. EXAM: MRI HEAD WITHOUT CONTRAST TECHNIQUE: Multiplanar, multiecho pulse sequences of the brain and surrounding structures were obtained without intravenous contrast. COMPARISON:  Head CT same day.  MRI 09/11/2021. FINDINGS: Brain: Diffusion imaging does not show any acute or subacute infarction. No focal abnormality seen affecting the brainstem or cerebellum. Cerebral hemispheres show mild to moderate chronic small-vessel ischemic changes the white matter. Giant aneurysm arising from the right ICA encroaching upon the medial aspect of the middle cranial fossa on the right as seen previously appears unchanged. No evidence of neoplastic mass lesion, hemorrhage, hydrocephalus or extra-axial collection. Vascular: Major vessels at the base of the brain show flow. Giant right ICA aneurysm as described previously. Skull and upper cervical spine: Negative Sinuses/Orbits: Clear/normal Other: None IMPRESSION: No acute or subacute infarction. Moderate chronic small-vessel ischemic changes of the cerebral hemispheric white matter. Known and apparently unchanged giant aneurysm of the ICA on the right. No sign of acute hemorrhage based on this exam. Electronically Signed   By: Paulina Fusi M.D.   On: 09/19/2021 18:42    Labs on Admission: I have personally reviewed following labs  CBC: Recent Labs  Lab 09/19/21 1219  WBC 9.4  HGB 15.4*  HCT 42.6  MCV 95.9  PLT 238   Basic Metabolic Panel: Recent Labs  Lab 09/13/21 0416 09/14/21 0312 09/19/21 1219  NA 130* 132* 128*  K 4.0 4.1 3.9  CL 97* 95* 91*  CO2 26 31 28   GLUCOSE 96 94 115*  BUN 11 12 10   CREATININE 0.67 0.70 0.58  CALCIUM 8.7* 8.7* 9.1   GFR: Estimated Creatinine Clearance: 36 mL/min (by C-G formula based on SCr of 0.58 mg/dL).  Urine analysis:    Component Value Date/Time   COLORURINE YELLOW 09/09/2021 0344    APPEARANCEUR CLEAR 09/09/2021 0344   APPEARANCEUR CLEAR 10/07/2014 0958   LABSPEC 1.015 09/09/2021 0344   LABSPEC 1.015 10/07/2014 0958   PHURINE 6.5 09/09/2021 0344   GLUCOSEU NEGATIVE 09/09/2021 0344   GLUCOSEU NEGATIVE 10/07/2014 0958   HGBUR NEGATIVE 09/09/2021 0344   BILIRUBINUR NEGATIVE 09/09/2021 0344   BILIRUBINUR NEGATIVE 10/07/2014 0958   KETONESUR TRACE (  A) 09/09/2021 0344   PROTEINUR NEGATIVE 09/09/2021 0344   NITRITE NEGATIVE 09/09/2021 0344   LEUKOCYTESUR NEGATIVE 09/09/2021 0344   LEUKOCYTESUR NEGATIVE 10/07/2014 0958   Dr. Sedalia Muta [facility]  If 7PM-7AM, please contact overnight-coverage provider If 7AM-7PM, please contact day coverage provider [email or web address]  09/19/2021, 8:37 PM

## 2021-09-19 NOTE — ED Triage Notes (Addendum)
Pt comes via EMS from [facility] with c/o continued headaches. Pt had fall week ago and was evaluated. Pt states since fall she had been having headaches. Pt states the headaches come and go. Pt states worse at night.  Pt has aneurysm. Facility wants pt checked out to check that out.  Pt is A*OX4, GCS-15 per EMS stroke screen neg.  BP-182/85

## 2021-09-19 NOTE — ED Provider Notes (Signed)
[facility] Emergency Department Provider Note ____________________________________________   Event Date/Time   First MD Initiated Contact with Patient 09/19/21 1337     (approximate)  I have reviewed the triage vital signs and the nursing notes.  HISTORY  Chief Complaint Headache   HPI Briana Lozano is a 87 y.o. femalewho presents to the ED for evaluation of headaches.  Chart review indicates recent medical admission from 9/30-10/5 due to an unwitnessed fall, hyponatremia and headaches.  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  Patient presents to the ED, accompanied by her son, for the ration of continued intermittent headaches.  She reports a similar quality headaches that was quite severe last night overnight when she was having difficulty getting Tylenol from different nurses at her facility.  She reports the pain is improved and is much better now.  Primary concern was raised by the patient's son who reports witnessing the patient to have a 30-minute episode of right-sided facial droop and dysarthria this morning that self resolved and has not yet recurred today.  Past Medical History:  Diagnosis Date   Arthritis    fingers   Brain aneurysm    COPD (chronic obstructive pulmonary disease) (HCC)    wheezing occasionally   COVID-19 12/19/2019   mild symptoms   Depression    Dyspnea    with exertion   GERD (gastroesophageal reflux disease)    Headache    Heart murmur    followed by PCP   History of hiatal hernia    History of phlebitis    Hypertension    Hypothyroidism    Osteoporosis    Pneumonia    in PAST   Sleep apnea    no CPAP   Vertigo    in past   Wears dentures    full upper    Patient Active Problem List   Diagnosis Date Noted   TIA (transient ischemic attack) 09/19/2021   Orthostatic hypotension    Numbness and tingling of right side of face    Gastroesophageal  reflux disease without esophagitis    Headache 09/10/2021   General weakness    Acute metabolic encephalopathy    Hyponatremia 09/09/2021   Altered mental status, unspecified 09/09/2021   Brain aneurysm    COPD (chronic obstructive pulmonary disease) (HCC)    Depression    AMS (altered mental status)    Hiatal hernia with GERD    Hypokalemia    Abdominal pain 01/28/2019   Hypothyroidism 01/17/2018   Accelerated hypertension 01/17/2018   Intermediate coronary syndrome (HCC) 01/17/2018   Macular degeneration 01/17/2018   Senile osteoporosis 01/17/2018   Obstructive sleep apnea syndrome 11/10/2015   Bowel habit changes 06/16/2014   BRBPR (bright red blood per rectum) 06/16/2014   Anxiety 05/29/2014    Past Surgical History:  Procedure Laterality Date   APPENDECTOMY     BREAST BIOPSY     CATARACT EXTRACTION W/PHACO Left 11/18/2019   Procedure: CATARACT EXTRACTION PHACO AND INTRAOCULAR LENS PLACEMENT (IOC) LEFT 5.77, 00:41.2;  Surgeon: Galen Manila, MD;  Location: [facility];  Service: Ophthalmology;  Laterality: Left;  sleep apnea   CATARACT EXTRACTION W/PHACO Right 01/20/2020   Procedure: CATARACT EXTRACTION PHACO AND INTRAOCULAR LENS PLACEMENT (IOC) RIGHT;  Surgeon: Galen Manila, MD;  Location: [facility];  Service: Ophthalmology;  Laterality: Right;  Lot #[id number] H [place]: 07:12 CDE: 00:59.3    COLONOSCOPY  2002,2008,2015   EYE SURGERY  HEMORRHOID SURGERY  1950   IR ANGIO INTRA EXTRACRAN SEL COM CAROTID INNOMINATE BILAT MOD SED  04/17/2017   IR ANGIO VERTEBRAL SEL VERTEBRAL UNI L MOD SED  04/17/2017   REPLACEMENT TOTAL KNEE Right 04/2005    Prior to Admission medications   Medication Sig Start Date End Date Taking? Authorizing Provider  acetaminophen (TYLENOL) 650 MG CR tablet Take 1,000 mg by mouth every 8 (eight) hours as needed for pain.    [provider]  bisacodyl (DULCOLAX) 10 MG suppository Place 1 suppository (10 mg total) rectally daily as  needed for moderate constipation. 09/14/21   Enedina Finner, MD  bisoprolol (ZEBETA) 5 MG tablet Take 0.5 tablets (2.5 mg total) by mouth daily. 09/14/21   Enedina Finner, MD  citalopram (CELEXA) 20 MG tablet Take 20 mg by mouth daily.     [provider]  cyanocobalamin (,VITAMIN B-12,) 1000 MCG/ML injection Inject 1,000 mcg into the muscle every 30 (thirty) days.    [provider]  divalproex (DEPAKOTE) 125 MG DR tablet Take 1 tablet (125 mg total) by mouth every 12 (twelve) hours. 09/14/21   Enedina Finner, MD  levothyroxine (SYNTHROID) 75 MCG tablet Take 75 mcg by mouth daily before breakfast.    [provider]  loratadine (CLARITIN) 10 MG tablet Take 10 mg by mouth daily.    [provider]  losartan (COZAAR) 50 MG tablet Take 50 mg by mouth daily. am    [provider]  Multiple Vitamin (MULTIVITAMIN WITH MINERALS) TABS tablet Take 1 tablet by mouth daily.    [provider]  Multiple Vitamins-Minerals (PRESERVISION AREDS 2 PO) Take 1 capsule by mouth 2 (two) times daily.    [provider]  ondansetron (ZOFRAN ODT) 4 MG disintegrating tablet Take 1 tablet (4 mg total) by mouth every 8 (eight) hours as needed for nausea or vomiting. 09/07/21   Chesley Noon, MD  Polyethyl Glycol-Propyl Glycol (SYSTANE OP) Place 1 drop into both eyes daily as needed (dry eyes).     [provider]  polyethylene glycol (MIRALAX / GLYCOLAX) packet Take 17 g by mouth daily as needed for mild constipation.     [provider]  Simethicone (PHAZYME MAXIMUM STRENGTH) 250 MG CAPS Take 250-500 mg by mouth daily as needed (gas).    [provider]    Allergies Cortisone, Penicillins, and Valium [diazepam]  Family History  Problem Relation Age of Onset   Stroke Mother    Lung cancer Father    Lung cancer Brother    Breast cancer Neg Hx     Social History Social History   Tobacco Use   Smoking status: Never   Smokeless tobacco:  Never  Vaping Use   Vaping Use: Never used  Substance Use Topics   Alcohol use: No   Drug use: No    Review of Systems  Constitutional: No fever/chills Eyes: No visual changes. ENT: No sore throat. Cardiovascular: Denies chest pain. Respiratory: Denies shortness of breath. Gastrointestinal: No abdominal pain.  No nausea, no vomiting.  No diarrhea.  No constipation. Genitourinary: Negative for dysuria. Musculoskeletal: Negative for back pain. Skin: Negative for rash. Neurological:  Positive right-sided facial weakness and dysarthria, positive for headache  ____________________________________________   PHYSICAL EXAM:  VITAL SIGNS: Vitals:   09/19/21 1530 09/19/21 1545  BP: (!) 182/89   Pulse: (!) 58 (!) 55  Resp:    Temp:    SpO2: 96% 96%     Constitutional: Alert and oriented.  Well appearing and in no acute distress. 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 Head: Atraumatic. Nose: No congestion/rhinnorhea. Mouth/Throat: Mucous membranes are moist.  Oropharynx non-erythematous. Neck: No stridor. No cervical spine tenderness to palpation. Cardiovascular: Normal rate, regular rhythm. Grossly normal heart sounds.  Good peripheral circulation. Respiratory: Normal respiratory effort.  No retractions. Lungs CTAB. Gastrointestinal: Soft , nondistended, nontender to palpation. No CVA tenderness. Musculoskeletal: No lower extremity tenderness nor edema.  No joint effusions. No signs of acute trauma. Neurologic:  Normal speech and language. No gross focal neurologic deficits are appreciated. Skin:  Skin is warm, dry and intact. No rash noted. Psychiatric: Mood and affect are normal. Speech and behavior are normal.  ____________________________________________   LABS (all labs ordered are listed, but only abnormal results are displayed)  Labs Reviewed  BASIC METABOLIC PANEL - Abnormal; Notable for the following components:      Result Value   Sodium 128 (*)     Chloride 91 (*)    Glucose, Bld 115 (*)    All other components within normal limits  CBC - Abnormal; Notable for the following components:   Hemoglobin 15.4 (*)    MCH 34.7 (*)    MCHC 36.2 (*)    All other components within normal limits  URINALYSIS, COMPLETE (UACMP) WITH MICROSCOPIC  URINE DRUG SCREEN, QUALITATIVE ([facility] ONLY)  CBG MONITORING, ED   ____________________________________________  12 Lead EKG  Sinus rhythm at a rate of 67 bpm.  Leftward axis.  Normal intervals.  No evidence of acute ischemia. ____________________________________________  RADIOLOGY  ED MD interpretation: CT head reviewed by me without evidence of ICH  Official radiology report(s): CT HEAD WO CONTRAST ( )  Result Date: 09/19/2021 CLINICAL DATA:  Headache. EXAM: CT HEAD WITHOUT CONTRAST TECHNIQUE: Contiguous axial images were obtained from the base of the skull through the vertex without intravenous contrast. COMPARISON:  September 09, 2021. FINDINGS: Brain: No mass effect or midline shift is noted. Ventricular size is within normal limits. There is no evidence of mass lesion, hemorrhage or acute infarction. Vascular: There is again noted 3 cm aneurysm involving the intracranial portion of the right internal carotid artery. Skull: Normal. Negative for fracture or focal lesion. Sinuses/Orbits: No acute finding. Other: None. 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 No other intracranial abnormality is noted. Electronically Signed   By: Lupita Raider M.D.   On: 09/19/2021 14:34    ____________________________________________   PROCEDURES and INTERVENTIONS  Procedure(s) performed (including Critical Care):  .1-3 Lead EKG Interpretation Performed by: Delton Prairie, MD Authorized by: Delton Prairie, MD     Interpretation: normal     ECG rate:  62   ECG rate assessment: normal     Rhythm: sinus rhythm      Ectopy: none     Conduction: normal    Medications  levothyroxine (SYNTHROID) tablet 75 mcg (has no administration in time range)  acetaminophen (TYLENOL) tablet 650 mg (has no administration in time range)    Or  acetaminophen (TYLENOL) 160 MG/5ML solution 650 mg (has no administration in time range)    Or  acetaminophen (TYLENOL) suppository 650 mg (has no administration in time range)  hydrALAZINE (APRESOLINE) injection 5 mg (has no administration in time range)  acetaminophen (TYLENOL) tablet 1,000 mg (1,000 mg Oral Given 09/19/21 1427)  bisacodyl (DULCOLAX) suppository 10 mg (10 mg Rectal Given 09/19/21 1606)   stroke: mapping our early  stages of recovery book ( Does not apply Given 09/19/21 1607)    ____________________________________________   MDM / ED COURSE   87 year old woman presents to the ED with chronic intermittent headaches, without evidence of ICH, but with complaints of transient dysarthria and right-sided facial droop concerning for TIA, requiring medical observation admission.  Neurologically intact on my examination without evidence of acute features.  She reports resolution of her headache with Tylenol and no recurrence of her weakness and dysarthria.  CT without evidence of SAH or other ICH.  Due to high suspicion for TIA, we will discussed with medicine for observation admission.      ____________________________________________   FINAL CLINICAL IMPRESSION(S) / ED DIAGNOSES  Final diagnoses:  TIA (transient ischemic attack)  Bad headache     ED Discharge Orders     None        Leda Bellefeuille   Note:  This document was prepared using [employer] executive software and may include unintentional dictation errors.    Delton Prairie, MD 09/19/21 [phone]

## 2021-09-19 NOTE — ED Notes (Signed)
Report received from Brandon, RN ?

## 2021-09-20 ENCOUNTER — Observation Stay
Admit: 2021-09-20 | Discharge: 2021-09-20 | Disposition: A | Discharge status: Home / Self Care | Payer: Medicare Other | Attending: Internal Medicine | Admitting: Internal Medicine

## 2021-09-20 ENCOUNTER — Encounter: Payer: Self-pay | Admitting: Internal Medicine

## 2021-09-20 DIAGNOSIS — Z23 Encounter for immunization: Secondary | ICD-10-CM | POA: Diagnosis present

## 2021-09-20 DIAGNOSIS — Z96641 Presence of right artificial hip joint: Secondary | ICD-10-CM | POA: Diagnosis present

## 2021-09-20 DIAGNOSIS — K219 Gastro-esophageal reflux disease without esophagitis: Secondary | ICD-10-CM | POA: Diagnosis present

## 2021-09-20 DIAGNOSIS — I639 Cerebral infarction, unspecified: Secondary | ICD-10-CM | POA: Diagnosis present

## 2021-09-20 DIAGNOSIS — Z20822 Contact with and (suspected) exposure to covid-19: Secondary | ICD-10-CM | POA: Diagnosis present

## 2021-09-20 DIAGNOSIS — Z961 Presence of intraocular lens: Secondary | ICD-10-CM | POA: Diagnosis present

## 2021-09-20 DIAGNOSIS — G500 Trigeminal neuralgia: Secondary | ICD-10-CM | POA: Diagnosis present

## 2021-09-20 DIAGNOSIS — K5900 Constipation, unspecified: Secondary | ICD-10-CM | POA: Diagnosis present

## 2021-09-20 DIAGNOSIS — I671 Cerebral aneurysm, nonruptured: Secondary | ICD-10-CM

## 2021-09-20 DIAGNOSIS — G4730 Sleep apnea, unspecified: Secondary | ICD-10-CM | POA: Diagnosis present

## 2021-09-20 DIAGNOSIS — E039 Hypothyroidism, unspecified: Secondary | ICD-10-CM | POA: Diagnosis present

## 2021-09-20 DIAGNOSIS — J449 Chronic obstructive pulmonary disease, unspecified: Secondary | ICD-10-CM | POA: Diagnosis present

## 2021-09-20 DIAGNOSIS — R519 Headache, unspecified: Secondary | ICD-10-CM | POA: Diagnosis present

## 2021-09-20 DIAGNOSIS — K449 Diaphragmatic hernia without obstruction or gangrene: Secondary | ICD-10-CM | POA: Diagnosis present

## 2021-09-20 DIAGNOSIS — G459 Transient cerebral ischemic attack, unspecified: Secondary | ICD-10-CM

## 2021-09-20 DIAGNOSIS — N390 Urinary tract infection, site not specified: Secondary | ICD-10-CM | POA: Diagnosis present

## 2021-09-20 DIAGNOSIS — Z8616 Personal history of COVID-19: Secondary | ICD-10-CM | POA: Diagnosis not present

## 2021-09-20 DIAGNOSIS — I10 Essential (primary) hypertension: Secondary | ICD-10-CM | POA: Diagnosis present

## 2021-09-20 DIAGNOSIS — R471 Dysarthria and anarthria: Secondary | ICD-10-CM | POA: Diagnosis present

## 2021-09-20 DIAGNOSIS — R29810 Facial weakness: Secondary | ICD-10-CM | POA: Diagnosis present

## 2021-09-20 DIAGNOSIS — F419 Anxiety disorder, unspecified: Secondary | ICD-10-CM | POA: Diagnosis present

## 2021-09-20 DIAGNOSIS — M810 Age-related osteoporosis without current pathological fracture: Secondary | ICD-10-CM | POA: Diagnosis present

## 2021-09-20 DIAGNOSIS — F32A Depression, unspecified: Secondary | ICD-10-CM | POA: Diagnosis present

## 2021-09-20 DIAGNOSIS — E871 Hypo-osmolality and hyponatremia: Secondary | ICD-10-CM | POA: Diagnosis present

## 2021-09-20 DIAGNOSIS — R251 Tremor, unspecified: Secondary | ICD-10-CM | POA: Diagnosis present

## 2021-09-20 DIAGNOSIS — I951 Orthostatic hypotension: Secondary | ICD-10-CM | POA: Diagnosis present

## 2021-09-20 LAB — CBC WITH DIFFERENTIAL/PLATELET
Abs Immature Granulocytes: 0.05 K/uL (ref 0.00–0.07)
Basophils Absolute: 0 K/uL (ref 0.0–0.1)
Basophils Relative: 0 %
Eosinophils Absolute: 0.1 K/uL (ref 0.0–0.5)
Eosinophils Relative: 1 %
HCT: 43.2 % (ref 36.0–46.0)
Hemoglobin: 15.7 g/dL — ABNORMAL HIGH (ref 12.0–15.0)
Immature Granulocytes: 0 %
Lymphocytes Relative: 13 %
Lymphs Abs: 1.5 K/uL (ref 0.7–4.0)
MCH: 34.9 pg — ABNORMAL HIGH (ref 26.0–34.0)
MCHC: 36.3 g/dL — ABNORMAL HIGH (ref 30.0–36.0)
MCV: 96 fL (ref 80.0–100.0)
Monocytes Absolute: 0.5 K/uL (ref 0.1–1.0)
Monocytes Relative: 4 %
Neutro Abs: 9.9 K/uL — ABNORMAL HIGH (ref 1.7–7.7)
Neutrophils Relative %: 82 %
Platelets: 247 K/uL (ref 150–400)
RBC: 4.5 MIL/uL (ref 3.87–5.11)
RDW: 13.3 % (ref 11.5–15.5)
WBC: 12.1 K/uL — ABNORMAL HIGH (ref 4.0–10.5)
nRBC: 0 % (ref 0.0–0.2)

## 2021-09-20 LAB — BASIC METABOLIC PANEL
Anion gap: 9 (ref 5–15)
BUN: 12 mg/dL (ref 8–23)
CO2: 25 mmol/L (ref 22–32)
Calcium: 9.1 mg/dL (ref 8.9–10.3)
Chloride: 92 mmol/L — ABNORMAL LOW (ref 98–111)
Creatinine, Ser: 0.5 mg/dL (ref 0.44–1.00)
GFR, Estimated: >60 mL/min (ref >60)
Glucose, Bld: 105 mg/dL — ABNORMAL HIGH (ref 70–99)
Potassium: 4.1 mmol/L (ref 3.5–5.1)
Sodium: 126 mmol/L — ABNORMAL LOW (ref 135–145)

## 2021-09-20 LAB — LIPID PANEL
Cholesterol: 175 mg/dL (ref 0–200)
HDL: 52 mg/dL (ref >40)
LDL Cholesterol: 110 mg/dL — ABNORMAL HIGH (ref 0–99)
Total CHOL/HDL Ratio: 3.4 RATIO
Triglycerides: 66 mg/dL (ref <150)
VLDL: 13 mg/dL (ref 0–40)

## 2021-09-20 LAB — ECHOCARDIOGRAM COMPLETE
AR max vel: 1.67 cm2
AV Area VTI: 1.62 cm2
AV Area mean vel: 1.56 cm2
AV Mean grad: 6 mmHg
AV Peak grad: 12.5 mmHg
Ao pk vel: 1.77 m/s
Area-P 1/2: 2.36 cm2
MV VTI: 1.3 cm2
S' Lateral: 1.63 cm

## 2021-09-20 LAB — RESP PANEL BY RT-PCR (FLU A&B, COVID) ARPGX2
Influenza A by PCR: NEGATIVE
Influenza B by PCR: NEGATIVE
SARS Coronavirus 2 by RT PCR: NEGATIVE

## 2021-09-20 LAB — HEMOGLOBIN A1C
Hgb A1c MFr Bld: 5.5 % (ref 4.8–5.6)
Mean Plasma Glucose: 111 mg/dL

## 2021-09-20 MED ORDER — GABAPENTIN 100 MG PO CAPS
100.0000 mg | ORAL_CAPSULE | Freq: Three times a day (TID) | ORAL | Status: AC
  Administered 2021-09-20 – 2021-09-22 (×8): 100 mg via ORAL
  Filled 2021-09-20 (×8): qty 1

## 2021-09-20 MED ORDER — INFLUENZA VAC A&B SA ADJ QUAD 0.5 ML IM PRSY
0.5000 mL | PREFILLED_SYRINGE | INTRAMUSCULAR | Status: AC
  Administered 2021-09-22: 0.5 mL via INTRAMUSCULAR
  Filled 2021-09-20 (×2): qty 0.5

## 2021-09-20 MED ORDER — MORPHINE SULFATE (PF) 2 MG/ML IV SOLN
2.0000 mg | INTRAVENOUS | Status: DC | PRN
  Administered 2021-09-20 – 2021-09-22 (×3): 2 mg via INTRAVENOUS
  Filled 2021-09-20 (×3): qty 1

## 2021-09-20 NOTE — ED Notes (Signed)
Patient  continues to report right-sided pain to her face. PRN Tylenol is ordered, however, pt cannot have another dose until 0600. Dr. Arville Care messaged

## 2021-09-20 NOTE — ED Notes (Signed)
Son at bedside.

## 2021-09-20 NOTE — ED Notes (Signed)
Serenity Rn aware of assigned bed

## 2021-09-20 NOTE — Progress Notes (Signed)
Eeg done 

## 2021-09-20 NOTE — Progress Notes (Signed)
*  PRELIMINARY RESULTS* Echocardiogram 2D Echocardiogram has been performed.  Briana Lozano 09/20/2021, 9:52 AM

## 2021-09-20 NOTE — Consult Note (Signed)
Neurology Consultation Reason for Consult: Facial pain Referring Physician: Swayze, a  CC: Facial pain  History is obtained from: Patient  HPI: Briana Lozano is a 87 y.o. female with a history of giant brain aneurysm who has been having progressive facial pain and numbness over the past few weeks.  There was concern couple of weeks ago for a sentinel bleed and therefore her antiplatelets have been stopped.  Yesterday, she had severe pain and had facial weakness during that timeframe.  There was also some concern for new onset tremor which is bilateral.  She has been having nausea and vomiting as well.  In work-up of the symptoms an MRI was performed which shows an unchanged large aneurysm.  She describes the pain is constant tingling, though with intermittent worsening, though not shocklike.  ROS: A 14 point ROS was performed and is negative except as noted in the HPI.   Past Medical History:  Diagnosis Date   Arthritis    fingers   Brain aneurysm    COPD (chronic obstructive pulmonary disease) (HCC)    wheezing occasionally   COVID-19 12/19/2019   mild symptoms   Depression    Dyspnea    with exertion   GERD (gastroesophageal reflux disease)    Headache    Heart murmur    followed by PCP   History of hiatal hernia    History of phlebitis    Hypertension    Hypothyroidism    Osteoporosis    Pneumonia    in PAST   Sleep apnea    no CPAP   Vertigo    in past   Wears dentures    full upper     Family History  Problem Relation Age of Onset   Stroke Mother    Lung cancer Father    Lung cancer Brother    Breast cancer Neg Hx      Social History:  reports that she has never smoked. She has never used smokeless tobacco. She reports that she does not drink alcohol and does not use drugs.   Exam: Current vital signs: BP (!) 151/88   Pulse (!) 59   Temp 98 F (36.7 C) (Oral)   Resp 19   SpO2 96%  Vital signs in last 24 hours: Temp:  [97.6 F (36.4  C)-98 F (36.7 C)] 98 F (36.7 C) (10/10 1430) Pulse Rate:  [55-69] 59 (10/11 1200) Resp:  [14-26] 19 (10/11 1200) BP: (117-196)/(57-99) 151/88 (10/11 1200) SpO2:  [91 %-100 %] 96 % (10/11 1200)   Physical Exam  Constitutional: Appears well-developed and well-nourished.  Psych: Affect appropriate to situation Eyes: No scleral injection HENT: No OP obstruction MSK: no joint deformities.  Cardiovascular: Normal rate and regular rhythm.  Respiratory: Effort normal, non-labored breathing GI: Soft.  No distension. There is no tenderness.  Skin: WDI  Neuro: Mental Status: Patient is awake, alert, oriented to person, place, month, year, and situation. Patient is able to give a clear and coherent history. No signs of aphasia or neglect Cranial Nerves: II: Visual Fields are full. Pupils are equal, round, and reactive to light.   III,IV, VI: EOMI in the left eye, in the right eye she has a medial deviation. V: Facial sensation is diminished in the V1 and V2 distribution on the right VII: Facial movement is symmetric.  VIII: hearing is intact to voice X: Uvula elevates symmetrically XI: Shoulder shrug is symmetric. XII: tongue is midline without atrophy or fasciculations.  Motor: Tone  is normal. Bulk is normal. 5/5 strength was present in all four extremities.  Sensory: Sensation is symmetric to light touch and temperature in the arms and legs. Cerebellar: No clear ataxia  I have reviewed labs in epic and the results pertinent to this consultation are: Sodium 126  I have reviewed the images obtained: MRI brain-large aneurysm  Impression: 87 year old female with facial pain which I suspect is related to the aneurysm impingement on the facial nerve.  Depakote was started, but I think she might get more benefit from a medication like gabapentin which is used for neuropathic pain.  Trileptal or Tegretol could also be useful, but with her hyponatremia already, I would be hesitant to  use those at this time.  I am doubtful of TIA with the facial droop, but will get an EEG given the report of tremor and facial drooping.  I would not favor antiplatelet therapy given the recent concern for sentinel bleed.  Pain from direct nerve compression can be difficult to treat, and I am not certain if her surgical options are going to be very appealing but she has follow-up with Dr. Conchita Paris scheduled.  In the meantime I think trials of medications aimed at neuropathic pain would be prudent.  Recommendations: 1) Gabapentin 100mg  tID x 3 days, then 200mg  TID x 3 days, then 300mg  TID. Could continue increasing based on response and side effects.  2) EEG  3) continue to plan follow-up with Dr. . 4) if EEG is negative, no further inpatient recommendations.   , MD [facility] [phone]  If 7pm- 7am, please page neurology on call as listed in AMION.

## 2021-09-20 NOTE — Evaluation (Signed)
Physical Therapy Evaluation Patient Details Name: Briana Lozano MRN: [id number] DOB: 10-16-1929 Today's Date: 09/20/2021  History of Present Illness  Pt is an 87 y.o. female with medical history significant for GERD, COPD, hypertension, hypothyroid, macular degeneration, anxiety, obstructive sleep apnea, who presents to the emergency room from [facility] for chief concerns of headache and right-sided facial droop. 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   Clinical Impression  Pt was pleasant and motivated to participate during the session. Pt required min to mod A with bed mobility tasks but was unable to attempt standing secondary to nausea and vomiting in sitting, nsg notifed.  Pt reported some "weakness or dizziness" in sitting with BP taken in sitting at 160/85 compared to 185/95 in supine. No gross asymmetry of strength noted during functional tasks or brief therapeutic exercises.  Pt generally weak functionally, however, and would not be safe to return home at this time.  Pt will benefit from PT services in a SNF setting upon discharge to safely address deficits listed in patient problem list for decreased caregiver assistance and eventual return to PLOF.           Recommendations for follow up therapy are one component of a multi-disciplinary discharge planning process, led by the attending physician.  Recommendations may be updated based on patient status, additional functional criteria and insurance authorization.  Follow Up Recommendations SNF;Supervision/Assistance - 24 hour    Equipment Recommendations  Other (comment) (TBD at next venue of care)    Recommendations for Other  Services       Precautions / Restrictions Precautions Precautions: Fall Restrictions Weight Bearing Restrictions: No      Mobility  Bed Mobility Overal bed mobility: Needs Assistance Bed Mobility: Supine to Sit;Sit to Supine     Supine to sit: Min assist Sit to supine: Mod assist   General bed mobility comments: Min to mod A for BLE and trunk control    Transfers                 General transfer comment: deferred secondary to nausea and vomiting  Ambulation/Gait                Stairs            Wheelchair Mobility    Modified Rankin (Stroke Patients Only)       Balance Overall balance assessment: Needs assistance Sitting-balance support: Bilateral upper extremity supported Sitting balance-Leahy Scale: Fair                                       Pertinent Vitals/Pain Pain Assessment: 0-10 Pain Score: 6  Pain Location: HA Pain Descriptors / Indicators: Headache Pain Intervention(s): Premedicated before session;Monitored during session    Home Living Family/patient expects to be discharged to:: Private residence Living Arrangements: Children Available Help at Discharge: Family;Available PRN/intermittently Type of Home: House Home Access: Stairs to enter Entrance Stairs-Rails: Right;Left;Can reach both Entrance Stairs-Number of Steps: 2 Home Layout: One level Home Equipment: Tub bench;Cane - quad      Prior Function Level of Independence: Needs assistance   Gait / Transfers Assistance Needed: Ambulated mod I (in home  with no AD, outdoors with quad cane). 1 fall in last 6 months.  ADL's / Homemaking Assistance Needed: I with ADLs, has help with some IADLs (drives some, usually someone comes with her to get groceries).  Comments: Prior recent prior hospitalization patient lived alone but now she expects to live with her son. States she has been weaker since she had COVID19 in 2020     Hand Dominance   Dominant Hand:  Right    Extremity/Trunk Assessment   Upper Extremity Assessment Upper Extremity Assessment: Generalized weakness    Lower Extremity Assessment Lower Extremity Assessment: Generalized weakness    Cervical / Trunk Assessment Cervical / Trunk Assessment: Kyphotic  Communication   Communication: No difficulties  Cognition Arousal/Alertness: Awake/alert Behavior During Therapy: WFL for tasks assessed/performed Overall Cognitive Status: Within Functional Limits for tasks assessed                                        General Comments      Exercises Total Joint Exercises Ankle Circles/Pumps: AROM;Strengthening;Both;10 reps Hip ABduction/ADduction: AROM;Strengthening;Both;5 reps Straight Leg Raises: AROM;Strengthening;Both;5 reps Long Arc Quad: AROM;Strengthening;Both;10 reps Knee Flexion: AROM;Strengthening;Both;10 reps Other Exercises Other Exercises: Static sitting at EOB x 8 min for improved core strength and activity tolerance   Assessment/Plan    PT Assessment Patient needs continued PT services  PT Problem List Decreased strength;Decreased knowledge of use of DME;Decreased activity tolerance;Decreased balance;Decreased mobility       PT Treatment Interventions DME instruction;Balance training;Gait training;Stair training;Functional mobility training;Therapeutic activities;Therapeutic exercise;Patient/family education    PT Goals (Current goals can be found in the Care Plan section)  Acute Rehab PT Goals Patient Stated Goal: To get stronger PT Goal Formulation: With patient Time For Goal Achievement: 10/03/21 Potential to Achieve Goals: Fair    Frequency Min 2X/week   Barriers to discharge Inaccessible home environment;Decreased caregiver support      Co-evaluation               AM-PAC PT "6 Clicks" Mobility  Outcome Measure Help needed turning from your back to your side while in a flat bed without using bedrails?: A Little Help  needed moving from lying on your back to sitting on the side of a flat bed without using bedrails?: A Lot Help needed moving to and from a bed to a chair (including a wheelchair)?: A Lot Help needed standing up from a chair using your arms (e.g., wheelchair or bedside chair)?: A Lot Help needed to walk in hospital room?: A Lot Help needed climbing 3-5 steps with a railing? : Total 6 Click Score: 12    End of Session   Activity Tolerance: Other (comment) (limited by nausea) Patient left: with call bell/phone within reach;in bed;with family/visitor present Nurse Communication: Mobility status;Other (comment) (Pt with N&V) PT Visit Diagnosis: Difficulty in walking, not elsewhere classified (R26.2);Muscle weakness (generalized) (M62.81)    Time: 4034-7425 PT Time Calculation (min) (ACUTE ONLY): 26 min   Charges:   PT Evaluation $PT Eval Moderate Complexity: 1 Mod        D. Scott Oracio Galen PT, DPT 09/20/21, 12:05 PM

## 2021-09-20 NOTE — Progress Notes (Addendum)
SLP Cancellation Note  Patient Details Name: Briana Lozano MRN: [id number] DOB: 12-07-1929   Cancelled treatment:       Reason Eval/Treat Not Completed: SLP screened, no needs identified, will sign off (chart reviewed; consulted NSG and met w/ pt and Son in room in ED.). Pt conversed in general conversation w/out overt expressive/receptive deficits noted; pt indicated her wants/needs to SLP and staff. Ppt's abnormal speech/dysarthria has resolved per pt/Son, and noted Neurology note. Pt denied any speech-language deficits currently. She did c/o mild pain in right side of face and tingling/numbness of right jaw -- suspect these could be contributing factors to pt's earlier complaints. NSG informed of pt's c/o pain.  No further skilled ST services indicated as pt appears at her baseline; chronic issues. Pt and Son agreed. NSG to reconsult if any change in status while admitted.   Of note, discussed general aspiration precautions w/ Son and pt as was recommended recent admit 09/13/2019. Pt has a baseline of Hiatal Hernia. Handout given and NSG updated. Precautions placed in chart as well.     Orinda Kenner, MS, CCC-SLP Speech Language Pathologist Rehab Services [phone] [facility] 09/20/2021, 6:06 PM

## 2021-09-20 NOTE — ED Notes (Signed)
Patient given an ice pack for her headache. Messaged Dr. Arville Care regarding pain medication. States to give a second dose of Tylenol 650mg . Verbal order read back

## 2021-09-20 NOTE — Progress Notes (Signed)
Patient arrived to unit in stable condition from ER with son at bedside.

## 2021-09-20 NOTE — Evaluation (Signed)
Occupational Therapy Evaluation Patient Details Name: Briana Lozano MRN: [id number] DOB: 09/01/1929 Today's Date: 09/20/2021   History of Present Illness Pt is an 87 y.o. female with medical history significant for GERD, COPD, hypertension, hypothyroid, macular degeneration, anxiety, obstructive sleep apnea, who presents to the emergency room from [facility] for chief concerns of headache and right-sided facial droop. 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   Clinical Impression   Pt was seen for OT evaluation this date. Prior to recent hospital admissions, pt was independent, living alone, and still driving some. Prior to Covid in 2020, she was regularly going dancing with friends. Pt reports she expects to live with her son from now on. Currently pt demonstrates impairments in BLE strength, activity tolerance, balance (posterior weight shift in standing), fearful of falling, and R eye visual deficits as described below (See OT problem list) which functionally limit her ability to perform ADL/self-care tasks. Pt currently requires MIN A for LB ADL, ADL transfers to [facility]. She also endorses lightheadedness with positional changes during session. BP elevated throughout session, RN notified of BP (details below).  Pt would benefit from skilled OT services to address noted impairments and functional limitations (see below for any additional details) in order to maximize safety and independence while minimizing falls risk and caregiver burden. Upon hospital discharge, recommend STR to maximize pt safety and return to PLOF.     Recommendations for follow up therapy are one component of a multi-disciplinary  discharge planning process, led by the attending physician.  Recommendations may be updated based on patient status, additional functional criteria and insurance authorization.   Follow Up Recommendations  SNF    Equipment Recommendations  3 in 1 bedside commode    Recommendations for Other Services       Precautions / Restrictions Precautions Precautions: Fall Restrictions Weight Bearing Restrictions: No      Mobility Bed Mobility Overal bed mobility: Needs Assistance Bed Mobility: Supine to Sit;Sit to Supine     Supine to sit: Min guard Sit to supine: Mod assist   General bed mobility comments: Mod A back to bed (stretcher is very high so she had difficulty with getting back in requiring assist for BLE and trunk/shoulders)    Transfers Overall transfer level: Needs assistance Equipment used: 1 person hand held assist Transfers: Sit to/from [employer] Sit to Stand: Min assist;From elevated surface Stand pivot transfers: Min assist       General transfer comment: SPT to BSC from stretcher, VC for hand placement, fearful of falling, posterior lean with weight shifted back on her heels    Balance Overall balance assessment: Needs assistance Sitting-balance support: Bilateral upper extremity supported Sitting balance-Leahy Scale: Fair     Standing balance support: Bilateral upper extremity supported;During functional activity Standing balance-Leahy Scale: Poor Standing balance comment: requires BUE support                           ADL either performed or assessed with clinical judgement   ADL Overall ADL's : Needs assistance/impaired  General ADL Comments: Pt required CGA for BSC transfer, Min A for ADL transfers otherwise from high stretcher, set up and supervision for toileting hygiene using lateral lean, Min A for LB ADL tasks, poor tolerance for standing ADL. Required return to  supine and log roll for donning brief.     Vision Baseline Vision/History: 1 Wears glasses Patient Visual Report: No change from baseline Additional Comments: Pt has R eye patched. Pt reports having double vision/blurred vision since recent cataracts surgery and unable to see out of her R eye since then, has been wearing glasses for reading and has film covering her R lens. Pt denies issues with vision in L eye.     Perception     Praxis      Pertinent Vitals/Pain Pain Assessment: No/denies pain Pain Score: 6  Pain Location: HA Pain Descriptors / Indicators: Headache Pain Intervention(s): Premedicated before session;Monitored during session     Hand Dominance Right   Extremity/Trunk Assessment Upper Extremity Assessment Upper Extremity Assessment: Generalized weakness;Overall WFL for tasks assessed (age appropriate)   Lower Extremity Assessment Lower Extremity Assessment: Generalized weakness   Cervical / Trunk Assessment Cervical / Trunk Assessment: Kyphotic   Communication Communication Communication: No difficulties   Cognition Arousal/Alertness: Awake/alert Behavior During Therapy: WFL for tasks assessed/performed Overall Cognitive Status: Within Functional Limits for tasks assessed                                 General Comments: Alert and oriented, follows commands with increased time.   General Comments       Exercises Other Exercises: Pt performed toileting via SPT to BSC with Min A, VC for hand placement to improve safety, fearful of falling and demonstrates posterior lean. Set up and supervision for lateral lean hygiene. Pt endorsed lightheadedness with positional changes throughout session. BP in supine 192/85 (MAP 114), HR 63. BP in sitting 168/95 MAP (114) HR 66. BP after standing and sitting on the BSC 162/92 (MAP 109) HR 64. BP once supine back in bed 207/94 (MAP 124) HR 62. RN notified of BP.   Shoulder Instructions      Home Living  Family/patient expects to be discharged to:: Private residence Living Arrangements: Children Available Help at Discharge: Family;Available PRN/intermittently Type of Home: House Home Access: Stairs to enter [employer] of Steps: 2 Entrance Stairs-Rails: Right;Left;Can reach both Home Layout: One level     Bathroom Shower/Tub: Chief Strategy Officer: Standard     Home Equipment: Tub bench;Cane - quad          Prior Functioning/Environment Level of Independence: Needs assistance  Gait / Transfers Assistance Needed: Ambulated mod I (in home with no AD, outdoors with quad cane). 1 fall in last 6 months. ADL's / Homemaking Assistance Needed: I with ADLs, has help with some IADLs (drives some, usually someone comes with her to get groceries).   Comments: Prior recent prior hospitalization patient lived alone but now she expects to live with her son. States she has been weaker since she had COVID19 in 2020        OT Problem List: Decreased strength;Decreased activity tolerance;Decreased safety awareness;Decreased knowledge of use of DME or AE;Impaired balance (sitting and/or standing);Impaired vision/perception;Cardiopulmonary status limiting activity;Pain      OT Treatment/Interventions: Self-care/ADL training;Therapeutic exercise;Therapeutic activities;Neuromuscular education;DME and/or AE instruction;Energy conservation;Patient/family education;Balance training;Visual/perceptual remediation/compensation    OT Goals(Current goals can be found in the care plan  section) Acute Rehab OT Goals Patient Stated Goal: To get stronger OT Goal Formulation: With patient Time For Goal Achievement: 10/04/21 Potential to Achieve Goals: Good ADL Goals Pt Will Perform Lower Body Dressing: with supervision;sit to/from stand Pt Will Transfer to Toilet: with supervision;ambulating;bedside commode (LRAD for amb) Additional ADL Goal #1: Pt will perform morning ADL grooming tasks  with supervision in standing at sink as tolerated, using LRAD for ADL mobility and PRN VC for incorporating ECS.  OT Frequency: Min 2X/week   Barriers to D/C:            Co-evaluation              AM-PAC OT "6 Clicks" Daily Activity     Outcome Measure Help from another person eating meals?: None Help from another person taking care of personal grooming?: A Little Help from another person toileting, which includes using toliet, bedpan, or urinal?: A Little Help from another person bathing (including washing, rinsing, drying)?: A Little Help from another person to put on and taking off regular upper body clothing?: A Little Help from another person to put on and taking off regular lower body clothing?: A Little 6 Click Score: 19   End of Session Nurse Communication: Mobility status;Other (comment) (BP, brief donned, urinated in [facility])  Activity Tolerance: Patient tolerated treatment well Patient left: in bed;with call bell/phone within reach  OT Visit Diagnosis: Other abnormalities of gait and mobility (R26.89);Muscle weakness (generalized) (M62.81)                Time: 0174-9449 OT Time Calculation (min): 32 min Charges:  OT General Charges $OT Visit: 1 Visit OT Evaluation $OT Eval Moderate Complexity: 1 Mod OT Treatments $Self Care/Home Management : 8-22 mins  Arman Filter., MPH, MS, OTR/L ascom [phone] 09/20/21, 3:36 PM

## 2021-09-20 NOTE — Procedures (Signed)
History: 87 year old female being evaluated for transient focal neurological symptoms.  Sedation: None  Technique: This EEG was acquired with electrodes placed according to the International 10-20 electrode system (including Fp1, Fp2, F3, F4, C3, C4, P3, P4, O1, O2, T3, T4, T5, T6, A1, A2, Fz, Cz, Pz). The following electrodes were missing or displaced: none.   Background: There is a posterior dominant rhythm of 8 Hz which attenuates with eye opening.  With drowsiness there is an increase in slow activity and anterior shifting of the posterior dominant rhythm.  Early sleep was recorded with symmetric appearing structures.    Photic stimulation: Physiologic driving is present  EEG Abnormalities: None  Clinical Interpretation: This normal EEG is recorded in the waking and sleep state. There was no seizure or seizure predisposition recorded on this study. Please note that lack of epileptiform activity on EEG does not preclude the possibility of epilepsy.   Ritta Slot, MD [facility] [phone]  If 7pm- 7am, please page neurology on call as listed in AMION.

## 2021-09-21 DIAGNOSIS — R112 Nausea with vomiting, unspecified: Secondary | ICD-10-CM

## 2021-09-21 DIAGNOSIS — K449 Diaphragmatic hernia without obstruction or gangrene: Secondary | ICD-10-CM

## 2021-09-21 DIAGNOSIS — E871 Hypo-osmolality and hyponatremia: Secondary | ICD-10-CM

## 2021-09-21 DIAGNOSIS — I951 Orthostatic hypotension: Principal | ICD-10-CM

## 2021-09-21 DIAGNOSIS — R519 Headache, unspecified: Secondary | ICD-10-CM

## 2021-09-21 DIAGNOSIS — E039 Hypothyroidism, unspecified: Secondary | ICD-10-CM

## 2021-09-21 LAB — BASIC METABOLIC PANEL
Anion gap: 6 (ref 5–15)
BUN: 14 mg/dL (ref 8–23)
CO2: 29 mmol/L (ref 22–32)
Calcium: 8.7 mg/dL — ABNORMAL LOW (ref 8.9–10.3)
Chloride: 91 mmol/L — ABNORMAL LOW (ref 98–111)
Creatinine, Ser: 0.65 mg/dL (ref 0.44–1.00)
GFR, Estimated: >60 mL/min (ref >60)
Glucose, Bld: 86 mg/dL (ref 70–99)
Potassium: 4 mmol/L (ref 3.5–5.1)
Sodium: 126 mmol/L — ABNORMAL LOW (ref 135–145)

## 2021-09-21 MED ORDER — SODIUM CHLORIDE 1 G PO TABS
1.0000 g | ORAL_TABLET | Freq: Two times a day (BID) | ORAL | Status: DC
  Administered 2021-09-21 (×2): 1 g via ORAL
  Filled 2021-09-21 (×2): qty 1

## 2021-09-21 MED ORDER — GABAPENTIN 300 MG PO CAPS: 300.0000 mg | ORAL_CAPSULE | Freq: Three times a day (TID) | ORAL | Status: DC

## 2021-09-21 MED ORDER — SODIUM CHLORIDE 0.9 % IV BOLUS
500.0000 mL | Freq: Once | INTRAVENOUS | Status: AC
  Administered 2021-09-21: 10:00:00 500 mL via INTRAVENOUS

## 2021-09-21 MED ORDER — LOSARTAN POTASSIUM 25 MG PO TABS: 25.0000 mg | ORAL_TABLET | Freq: Every day | ORAL | Status: DC

## 2021-09-21 MED ORDER — GABAPENTIN 100 MG PO CAPS
200.0000 mg | ORAL_CAPSULE | Freq: Three times a day (TID) | ORAL | Status: DC
  Administered 2021-09-23: 200 mg via ORAL
  Filled 2021-09-21: qty 2

## 2021-09-21 MED ORDER — CITALOPRAM HYDROBROMIDE 20 MG PO TABS
10.0000 mg | ORAL_TABLET | Freq: Every day | ORAL | Status: DC
  Administered 2021-09-22 – 2021-09-23 (×2): 10 mg via ORAL
  Filled 2021-09-21 (×2): qty 1

## 2021-09-21 MED ORDER — PANTOPRAZOLE SODIUM 40 MG PO TBEC
40.0000 mg | DELAYED_RELEASE_TABLET | Freq: Every day | ORAL | Status: DC
  Administered 2021-09-21 – 2021-09-22 (×2): 40 mg via ORAL
  Filled 2021-09-21 (×2): qty 1

## 2021-09-21 MED ORDER — ENSURE ENLIVE PO LIQD
237.0000 mL | Freq: Two times a day (BID) | ORAL | Status: DC
  Administered 2021-09-21 – 2021-09-23 (×4): 237 mL via ORAL

## 2021-09-21 MED ORDER — ONDANSETRON HCL 4 MG/2ML IJ SOLN
4.0000 mg | Freq: Four times a day (QID) | INTRAMUSCULAR | Status: DC | PRN
  Administered 2021-09-21 – 2021-09-22 (×4): 4 mg via INTRAVENOUS
  Filled 2021-09-21 (×5): qty 2

## 2021-09-21 NOTE — Progress Notes (Signed)
Patient ID: Briana Lozano, female   DOB: 1929-04-01, 87 y.o.   MRN: [id number] [facility] PROGRESS NOTE  Ivet Guerrieri YTK:[id number] DOB: 01/26/1929 DOA: 09/19/2021 PCP: Dorothey Baseman, MD  HPI/Subjective: Patient feeling a little bit better.  Gabapentin helping with her right-sided facial pain better than the Depakote.  As per neurology able to get rid of the Depakote.  Patient stated that she did eat a little bit better but nursing staff reported that she did vomit after I saw her.  Patient was orthostatic this morning with blood pressure dropping into the 70s with standing.  Objective: Vitals:   09/21/21 0746 09/21/21 1137  BP: (!) 156/76 122/68  Pulse: (!) 58 61  Resp: 18 18  Temp:  97.8 F (36.6 C)  SpO2: 96% 95%    Intake/Output Summary (Last 24 hours) at 09/21/2021 1437 Last data filed at 09/21/2021 1014 Gross per 24 hour  Intake 360 ml  Output --  Net 360 ml   There were no vitals filed for this visit.  ROS: Review of Systems  Respiratory:  Negative for shortness of breath.   Cardiovascular:  Negative for chest pain.  Gastrointestinal:  Positive for nausea and vomiting. Negative for abdominal pain.  Exam: Physical Exam HENT:     Head: Normocephalic.     Mouth/Throat:     Pharynx: No oropharyngeal exudate.  Eyes:     General: Lids are normal.     Conjunctiva/sclera: Conjunctivae normal.     Comments: Right eye deviated in.  Right eye patched.  Cardiovascular:     Rate and Rhythm: Normal rate and regular rhythm.     Heart sounds: Normal heart sounds, S1 normal and S2 normal.  Pulmonary:     Breath sounds: No decreased breath sounds, wheezing, rhonchi or rales.  Abdominal:     Palpations: Abdomen is soft.     Tenderness: There is no abdominal tenderness.  Musculoskeletal:     Right lower leg: No swelling.     Left lower leg: No swelling.  Skin:    General: Skin is warm.     Findings: No rash.  Neurological:     Mental Status:  She is alert and oriented to person, place, and time.      Scheduled Meds:  [START ON 09/22/2021] citalopram  10 mg Oral Daily   gabapentin  100 mg Oral TID   [START ON 09/23/2021] gabapentin  200 mg Oral TID   Followed by   Melene Muller ON 09/26/2021] gabapentin  300 mg Oral TID   influenza vaccine adjuvanted  0.5 mL Intramuscular Tomorrow-1000   levothyroxine  75 mcg Oral QAC breakfast   [START ON 09/22/2021] losartan  25 mg Oral Daily   multivitamin with minerals  1 tablet Oral Daily   polyethylene glycol  17 g Oral Daily   senna  1 tablet Oral QHS   sodium chloride  1 g Oral BID WC     Assessment/Plan:  Orthostatic hypotension.  Hold antihypertensive medications today.  Continue to check orthostatic vital signs.  TED hose.  IV fluid bolus today. Neuropathic right facial pain improving with gabapentin.  Discontinue Depakote. Hyponatremia.  Hydrochlorothiazide discontinued last admission.  We will taper Celexa down to 10 mg.  Encourage eating.  Sodium chloride tablets if able to tolerate. Nausea vomiting and a hiatal hernia.  Start PPI. Hypothyroidism unspecified on levothyroxine Weakness.  Physical therapy recommending rehab     Code Status:     Code Status Orders  (From  admission, onward)           Start     Ordered   09/19/21 1511  Full code  Continuous        09/19/21 1511           Code Status History     Date Active Date Inactive Code Status Order ID Comments User Context   09/09/2021 0959 09/14/2021 2205 Full Code [id number]  Lucile Shutters, MD ED   04/17/2017 1320 04/18/2017 0312 Full Code [id number]  Lisbeth Renshaw, MD HOV      Advance Directive Documentation    Flowsheet Row Most Recent Value  Type of Advance Directive Healthcare Power of Attorney  Pre-existing out of facility DNR order (yellow form or pink MOST form) --  "MOST" Form in Place? --      Family Communication: Son and niece at the bedside Disposition Plan: Status is:  Inpatient  Dispo: The patient is from: Rehab              Anticipated d/c is to: Rehab              Patient currently orthostatic with blood pressure dropping into the 70s with standing   Difficult to place patient.  No.  Consultants: Neurology  Time spent: 27 minutes  Charina Fons [employer]

## 2021-09-21 NOTE — Progress Notes (Signed)
Briana Lozano reports that her headache is somewhat better this morning.  She thinks that it is attributed to gabapentin.  She is awake, alert, interactive and appropriate.  I suspect that her pain is more due to direct nerve irritation than it is migrainous in nature and therefore I think the gabapentin may work better than Depakote.  Rather than having her on both agents, I would favor changing from Depakote to gabapentin.  I would continue gabapentin 100 3 times daily for a total of 3 days, then increase to 200 3 times daily for 3 days, then 300 3 times daily.   Neurology will be available on an as-needed basis, please call with further questions or concerns.  Ritta Slot, MD [facility] [phone]  If 7pm- 7am, please page neurology on call as listed in AMION.

## 2021-09-21 NOTE — TOC Initial Note (Signed)
Transition of Care [facility]) - Initial/Assessment Note    Patient Details  Name: Briana Lozano MRN: [id number] Date of Birth: November 19, 1929  [facility]) CM/SW Contact:    Caryn Section, RN Phone Number: 09/21/2021, 1:02 PM  Clinical Narrative:   Patient is a resident of [facility].  As per patient, son and facility, patient can return there on discharge.  TOC contact information provided, TOC to follow to discharge.                Expected Discharge Plan: Skilled Nursing Facility Barriers to Discharge: Continued Medical Work up   Patient Goals and CMS Choice        Expected Discharge Plan and Services Expected Discharge Plan: Skilled Nursing Facility   Discharge Planning Services: CM Consult Post Acute Care Choice: Skilled Nursing Facility ([facility].) Living arrangements for the past 2 months: Skilled Nursing Facility                 DME Arranged:  (N/A)         HH Arranged:  (n/a)          Prior Living Arrangements/Services Living arrangements for the past 2 months: Skilled Nursing Facility Lives with:: Facility Resident Patient language and need for interpreter reviewed:: Yes (No translator required) Do you feel safe going back to the place where you live?: Yes      Need for Family Participation in Patient Care: Yes (Comment) Care giver support system in place?: Yes (comment)   Criminal Activity/Legal Involvement Pertinent to Current Situation/Hospitalization: No - Comment as needed  Activities of Daily Living Home Assistive Devices/Equipment: Dan Humphreys (specify type) ADL Screening (condition at time of admission) Patient's cognitive ability adequate to safely complete daily activities?: Yes Is the patient deaf or have difficulty hearing?: Yes Does the patient have difficulty seeing, even when wearing glasses/contacts?: Yes Does the patient have difficulty concentrating, remembering, or making decisions?: No Patient able to express  need for assistance with ADLs?: Yes Does the patient have difficulty dressing or bathing?: Yes Independently performs ADLs?: No Does the patient have difficulty walking or climbing stairs?: Yes Weakness of Legs: Both Weakness of Arms/Hands: Both  Permission Sought/Granted Permission sought to share information with : Facility Medical sales representative, Case Automotive engineer granted to share info w AGENCY: [facility]        Emotional Assessment Appearance:: Appears stated age Attitude/Demeanor/Rapport: Gracious Affect (typically observed): Pleasant Orientation: : Oriented to Self, Oriented to Place Alcohol / Substance Use: Not Applicable Psych Involvement: No (comment)  Admission diagnosis:  TIA (transient ischemic attack) [G45.9] Bad headache [R51.9] Stroke [facility]) [I63.9] Patient Active Problem List   Diagnosis Date Noted   Stroke (HCC) 09/20/2021   TIA (transient ischemic attack) 09/19/2021   Constipation 09/19/2021   Orthostatic hypotension    Numbness and tingling of right side of face    Gastroesophageal reflux disease without esophagitis    Headache 09/10/2021   General weakness    Acute metabolic encephalopathy    Hyponatremia 09/09/2021   Altered mental status, unspecified 09/09/2021   Brain aneurysm    COPD (chronic obstructive pulmonary disease) (HCC)    Depression    AMS (altered mental status)    Hiatal hernia with GERD    Hypokalemia    Abdominal pain 01/28/2019   Hypothyroidism 01/17/2018   Accelerated hypertension 01/17/2018   Intermediate coronary syndrome (HCC) 01/17/2018   Macular degeneration 01/17/2018   Senile osteoporosis 01/17/2018  Obstructive sleep apnea syndrome 11/10/2015   Bowel habit changes 06/16/2014   BRBPR (bright red blood per rectum) 06/16/2014   Anxiety 05/29/2014   PCP:  Dorothey Baseman, MD Pharmacy:   [facility] [place] - [address] [place] [address] Phone: [phone] Fax:  [phone]     Social Determinants of Health (SDOH) Interventions    Readmission Risk Interventions No flowsheet data found.

## 2021-09-22 DIAGNOSIS — R300 Dysuria: Secondary | ICD-10-CM

## 2021-09-22 LAB — BASIC METABOLIC PANEL
Anion gap: 3 — ABNORMAL LOW (ref 5–15)
BUN: 14 mg/dL (ref 8–23)
CO2: 32 mmol/L (ref 22–32)
Calcium: 8.8 mg/dL — ABNORMAL LOW (ref 8.9–10.3)
Chloride: 93 mmol/L — ABNORMAL LOW (ref 98–111)
Creatinine, Ser: 0.7 mg/dL (ref 0.44–1.00)
GFR, Estimated: >60 mL/min (ref >60)
Glucose, Bld: 91 mg/dL (ref 70–99)
Potassium: 4.1 mmol/L (ref 3.5–5.1)
Sodium: 128 mmol/L — ABNORMAL LOW (ref 135–145)

## 2021-09-22 LAB — URINALYSIS, COMPLETE (UACMP) WITH MICROSCOPIC
Bilirubin Urine: NEGATIVE
Glucose, UA: NEGATIVE mg/dL
Hgb urine dipstick: NEGATIVE
Ketones, ur: 5 mg/dL — AB
Nitrite: NEGATIVE
Protein, ur: NEGATIVE mg/dL
Specific Gravity, Urine: 1.017 (ref 1.005–1.030)
pH: 5 (ref 5.0–8.0)

## 2021-09-22 MED ORDER — MIDODRINE HCL 5 MG PO TABS
2.5000 mg | ORAL_TABLET | Freq: Three times a day (TID) | ORAL | Status: DC
  Administered 2021-09-22 (×2): 2.5 mg via ORAL
  Filled 2021-09-22 (×3): qty 1

## 2021-09-22 MED ORDER — SODIUM CHLORIDE 0.9 % IV BOLUS
500.0000 mL | Freq: Once | INTRAVENOUS | Status: AC
  Administered 2021-09-22: 11:00:00 500 mL via INTRAVENOUS

## 2021-09-22 NOTE — Progress Notes (Signed)
OT Cancellation Note  Patient Details Name: Briana Lozano MRN: [id number] DOB: 21-Jan-1929   Cancelled Treatment:    Reason Eval/Treat Not Completed: Medical issues which prohibited therapy. Chart reviewed. RN starting bolus for low BP. Requesting therapy hold until lunchtime. Will re-attempt this afternoon as appropriate.     Arman Filter., MPH, MS, OTR/L ascom [phone] 09/22/21, 11:07 AM

## 2021-09-22 NOTE — Progress Notes (Signed)
Physical Therapy Treatment Patient Details Name: Briana Lozano MRN: [id number] DOB: 19-Jun-1929 Today's Date: 09/22/2021   History of Present Illness Pt is an 87 y.o. female with medical history significant for GERD, COPD, hypertension, hypothyroid, macular degeneration, anxiety, obstructive sleep apnea, who presents to the emergency [facility] for chief concerns of headache and right-sided facial droop. 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    PT Comments    Pt received supine in bed, agreeable to therapy. Pt was pleasant and motivated throughout. Bed mobility has improved to SUP. STS was initially MIN A and improved to CGA with proper mechanics and use of RW. Pt performed 7 STS total - 1 for transfer and 6 for exercise. Pt remained standing for 10-20 seconds with each STS with PT cuing upright posture. Pt did have urinary stress incontinence with exercise - would rec disposable brief next session. Fatigue was reported midway through session however pt was determined to fulfill STS reps. She remained asymptomatic throughout session and so PT left pt in recliner at end of session, feet elevated and back partially reclined. RN notified. As vitals allow, plan to initiate ambulation next session. Would benefit from skilled PT to address above deficits and promote optimal return to PLOF.    Recommendations for follow up therapy are one component of a multi-disciplinary discharge planning process, led by the attending physician.  Recommendations may be updated based on patient status, additional functional criteria and insurance authorization.  Follow Up Recommendations   SNF;Supervision/Assistance - 24 hour     Equipment Recommendations  Other (comment) (TBD at next venue of care)    Recommendations for Other Services       Precautions / Restrictions Precautions Precautions: Fall Restrictions Weight Bearing Restrictions: No     Mobility  Bed Mobility Overal bed mobility: Needs Assistance Bed Mobility: Supine to Sit     Supine to sit: Supervision;HOB elevated     General bed mobility comments: SUP for safety, VC to reach across body for bed rail. Pt performed as PT managed IV pole around end of bed.    Transfers Overall transfer level: Needs assistance Equipment used: 1 person hand held assist;Rolling walker (2 wheeled) Transfers: Sit to/from [employer] Sit to Stand: From elevated surface;Min assist Stand pivot transfers: Min assist       General transfer comment: 1st STS and SPT performed with HHA and MIN A for eccentric control. Then performed 6 STS using RW with CGA and improved eccentric control via hand placement.  Ambulation/Gait             General Gait Details: deferred - pt feeling weak and asked to remain at chair   Stairs             Wheelchair Mobility    Modified Rankin (Stroke Patients Only)       Balance Overall balance assessment: Needs assistance Sitting-balance support: Bilateral upper extremity supported Sitting balance-Leahy Scale: Fair Sitting balance - Comments: No apparent deficits while sitting EOB   Standing balance support: Bilateral upper extremity supported;During functional activity Standing balance-Leahy Scale: Poor Standing balance comment: requires BUE support via HHA or RW. Pt did remain standing for 10-20 seconds each STS rep with 1 minute standing last  rep for clean up chuck change.                            Cognition Arousal/Alertness: Awake/alert Behavior During Therapy: WFL for tasks assessed/performed Overall Cognitive Status: Within  Functional Limits for tasks assessed                                        Exercises Other Exercises Other Exercises: STS x7 reps total - 1 for transfer and 6 for exercise. Assist level decreased with use of RW and VC on hand placement and sequencing. Frequent reminders required.    General Comments        Pertinent Vitals/Pain Pain Assessment: No/denies pain    Home Living                      Prior Function            PT Goals (current goals can now be found in the care plan section) Acute Rehab PT Goals Patient Stated Goal: To get stronger PT Goal Formulation: With patient    Frequency    Min 2X/week      PT Plan      Co-evaluation              AM-PAC PT "6 Clicks" Mobility   Outcome Measure  Help needed turning from your back to your side while in a flat bed without using bedrails?: A Little Help needed moving from lying on your back to sitting on the side of a flat bed without using bedrails?: A Little Help needed moving to and from a bed to a chair (including a wheelchair)?: A Little Help needed standing up from a chair using your arms (e.g., wheelchair or bedside chair)?: A Little Help needed to walk in hospital room?: A Lot Help needed climbing 3-5 steps with a railing? : A Lot 6 Click Score: 16    End of Session Equipment Utilized During Treatment: Gait belt Activity Tolerance: Patient tolerated treatment well;Patient limited by fatigue Patient left: with call bell/phone within reach;in chair Nurse Communication: Mobility status PT Visit Diagnosis: Difficulty in walking, not elsewhere classified (R26.2);Muscle weakness (generalized) (M62.81)     Time: 5732-2025 PT Time Calculation (min) (ACUTE ONLY): 29 min  Charges:  $Therapeutic Activity: 23-37 mins                     Basilia Jumbo PT, DPT 09/22/21 4:31 PM [phone]

## 2021-09-22 NOTE — [place] FL2 (Signed)
[place] MEDICAID FL2 LEVEL OF CARE SCREENING TOOL     IDENTIFICATION  Patient Name: Briana Lozano Birthdate: 01/29/1929 Sex: female Admission Date (Current Location): 09/19/2021  [facility] and [place] Number:  Chiropodist and Address:  [facility], [address] [place], [place] [address]      Provider Number: [id number]  Attending Physician Name and Address:  Alford Highland, MD  Relative Name and Phone Number:  Angelina Ok (Son)   [phone] [facility] Phone)    Current Level of Care: Hospital Recommended Level of Care: Skilled Nursing Facility Prior Approval Number:    Date Approved/Denied:   PASRR Number: [id number]  Discharge Plan: SNF    Current Diagnoses: Patient Active Problem List   Diagnosis Date Noted   Nausea and vomiting    Stroke (HCC) 09/20/2021   TIA (transient ischemic attack) 09/19/2021   Constipation 09/19/2021   Orthostatic hypotension    Numbness and tingling of right side of face    Gastroesophageal reflux disease without esophagitis    Right facial pain 09/10/2021   Weakness    Acute metabolic encephalopathy    Hyponatremia 09/09/2021   Altered mental status, unspecified 09/09/2021   Brain aneurysm    COPD (chronic obstructive pulmonary disease) (HCC)    Depression    AMS (altered mental status)    Hiatal hernia    Hypokalemia    Abdominal pain 01/28/2019   Hypothyroidism 01/17/2018   Accelerated hypertension 01/17/2018   Intermediate coronary syndrome (HCC) 01/17/2018   Macular degeneration 01/17/2018   Senile osteoporosis 01/17/2018   Obstructive sleep apnea syndrome 11/10/2015   Bowel habit changes 06/16/2014   BRBPR (bright red blood per rectum) 06/16/2014   Anxiety 05/29/2014    Orientation RESPIRATION BLADDER Height & Weight     Self, Time, Situation, Place  Normal Continent Weight:   Height:     BEHAVIORAL SYMPTOMS/MOOD NEUROLOGICAL BOWEL NUTRITION STATUS      Continent  Diet (REgular)  AMBULATORY STATUS COMMUNICATION OF NEEDS Skin   Extensive Assist Verbally Normal                       Personal Care Assistance Level of Assistance  Bathing, Feeding, Dressing Bathing Assistance: Limited assistance Feeding assistance: Limited assistance Dressing Assistance: Limited assistance     Functional Limitations Info  Sight, Hearing, Speech Sight Info: Impaired Hearing Info: Adequate Speech Info: Adequate    SPECIAL CARE FACTORS FREQUENCY  PT (By licensed PT), OT (By licensed OT)     PT Frequency: 5 times weekly OT Frequency: 5 times weekly            Contractures Contractures Info: Not present    Additional Factors Info  Code Status, Allergies Code Status Info: Full Code Allergies Info: Cortisone Penicillins, Valium           Current Medications (09/22/2021):  This is the current hospital active medication list Current Facility-Administered Medications  Medication Dose Route Frequency Provider Last Rate Last Admin   acetaminophen (TYLENOL) tablet 650 mg  650 mg Oral Q4H PRN Cox, Amy N, DO   650 mg at 09/22/21 1107   Or   acetaminophen (TYLENOL) 160 MG/5ML solution 650 mg  650 mg Per Tube Q4H PRN Cox, Amy N, DO       Or   acetaminophen (TYLENOL) suppository 650 mg  650 mg Rectal Q4H PRN Cox, Amy N, DO       bisacodyl (DULCOLAX) suppository 10 mg  10 mg Rectal Daily PRN Cox, Amy N, DO   10 mg at 09/21/21 0756   citalopram (CELEXA) tablet 10 mg  10 mg Oral Daily Alford Highland, MD   10 mg at 09/22/21 4008   feeding supplement (ENSURE ENLIVE / ENSURE PLUS) liquid 237 mL  237 mL Oral BID BM Alford Highland, MD   237 mL at 09/22/21 1342   gabapentin (NEURONTIN) capsule 100 mg  100 mg Oral TID Alford Highland, MD   100 mg at 09/22/21 0937   [START ON 09/23/2021] gabapentin (NEURONTIN) capsule 200 mg  200 mg Oral TID Alford Highland, MD       Followed by   Melene Tashala Cumbo ON 09/26/2021] gabapentin (NEURONTIN) capsule 300 mg  300 mg Oral TID  Alford Highland, MD       levothyroxine (SYNTHROID) tablet 75 mcg  75 mcg Oral QAC breakfast Cox, Amy N, DO   75 mcg at 09/22/21 6761   midodrine (PROAMATINE) tablet 2.5 mg  2.5 mg Oral TID WC Wieting, Richard, MD   2.5 mg at 09/22/21 1046   morphine 2 MG/ML injection 2 mg  2 mg Intravenous Q4H PRN Mansy, Jan A, MD   2 mg at 09/20/21 0951   multivitamin with minerals tablet 1 tablet  1 tablet Oral Daily Cox, Amy N, DO   1 tablet at 09/22/21 0937   ondansetron (ZOFRAN) injection 4 mg  4 mg Intravenous Q6H PRN Alford Highland, MD   4 mg at 09/22/21 0940   pantoprazole (PROTONIX) EC tablet 40 mg  40 mg Oral QHS Wieting, Richard, MD   40 mg at 09/21/21 2032   polyethylene glycol (MIRALAX / GLYCOLAX) packet 17 g  17 g Oral Daily Cox, Amy N, DO   17 g at 09/22/21 9509   senna (SENOKOT) tablet 8.6 mg  1 tablet Oral QHS Cox, Amy N, DO   8.6 mg at 09/21/21 2032     Discharge Medications: Please see discharge summary for a list of discharge medications.  Relevant Imaging Results:  Relevant Lab Results:   Additional Information SS [id number] fully vaccinated against COVID  Caryn Section, RN

## 2021-09-22 NOTE — Progress Notes (Signed)
PT Cancellation Note  Patient Details Name: Chasmine Lender MRN: [id number] DOB: [birth year]   Cancelled Treatment:    Reason Eval/Treat Not Completed: Medical issues which prohibited therapy. Upon arrival, RN in pt room reporting hypotension of 86/44. RN starting bolus and asks PT to hold until lunchtime. PT will follow up this afternoon as time permits.    Basilia Jumbo PT, DPT 09/22/21 10:55 AM [phone]

## 2021-09-22 NOTE — Progress Notes (Signed)
Patient ID: Kyley Solow, female   DOB: Apr 11, 1929, 87 y.o.   MRN: [id number] [facility] PROGRESS NOTE  Brynne Doane BHA:[id number] DOB: October 03, 1929 DOA: 09/19/2021 PCP: Dorothey Baseman, MD  HPI/Subjective: Patient admitted with right facial droop initially.  Started on gabapentin to help out with her facial pain.  Blood pressure again dropped down into the 70s with standing and she felt miserable.  Having some burning on urination.  Objective: Vitals:   09/22/21 1101 09/22/21 1138  BP: 122/65 119/60  Pulse:  62  Resp:  18  Temp:  97.6 F (36.4 C)  SpO2:  95%    Intake/Output Summary (Last 24 hours) at 09/22/2021 1511 Last data filed at 09/22/2021 1358 Gross per 24 hour  Intake 240 ml  Output 575 ml  Net -335 ml    There were no vitals filed for this visit.  ROS: Review of Systems  Respiratory:  Negative for shortness of breath.   Cardiovascular:  Negative for chest pain.  Gastrointestinal:  Positive for nausea. Negative for abdominal pain and vomiting.  Genitourinary:  Positive for dysuria.  Exam: Physical Exam HENT:     Head: Normocephalic.     Mouth/Throat:     Pharynx: No oropharyngeal exudate.  Eyes:     General: Lids are normal.     Conjunctiva/sclera: Conjunctivae normal.  Cardiovascular:     Rate and Rhythm: Normal rate and regular rhythm.     Heart sounds: Normal heart sounds, S1 normal and S2 normal.  Pulmonary:     Breath sounds: Normal breath sounds. No decreased breath sounds, wheezing, rhonchi or rales.  Abdominal:     Palpations: Abdomen is soft.     Tenderness: There is no abdominal tenderness.  Skin:    General: Skin is warm.     Findings: No rash.  Neurological:     Mental Status: She is alert and oriented to person, place, and time.      Scheduled Meds:  citalopram  10 mg Oral Daily   feeding supplement  237 mL Oral BID BM   gabapentin  100 mg Oral TID   [START ON 09/23/2021] gabapentin  200 mg Oral TID    Followed by   Melene Muller ON 09/26/2021] gabapentin  300 mg Oral TID   levothyroxine  75 mcg Oral QAC breakfast   midodrine  2.5 mg Oral TID WC   multivitamin with minerals  1 tablet Oral Daily   pantoprazole  40 mg Oral QHS   polyethylene glycol  17 g Oral Daily   senna  1 tablet Oral QHS     Assessment/Plan:  Orthostatic hypotension.  Continue to hold antihypertensive medications again today.  Give another fluid bolus.  Continue TED hose.  Start low-dose midodrine.   Right facial pain likely neuropathic in nature.  Continue gabapentin Hyponatremia.  Hydrochlorothiazide discontinued last admission.  Taper Celexa down to 10 mg.  Patient unable to tolerate sodium tablets.  Sodium 128 today.  Continue ensures between meals. Nausea vomiting likely secondary to sodium tablets yesterday. Hiatal hernia on PPI Hypothyroidism unspecified on levothyroxine Weakness.  Physical therapy recommending rehab Burning on urination.  Send off a urine analysis. Intracranial aneurysm.  Seen by neurology and neurosurgery and outpatient follow-up.    Code Status:     Code Status Orders  (From admission, onward)           Start     Ordered   09/19/21 1511  Full code  Continuous  09/19/21 1511           Code Status History     Date Active Date Inactive Code Status Order ID Comments User Context   09/09/2021 0959 09/14/2021 2205 Full Code [id number]  Lucile Shutters, MD ED   04/17/2017 1320 04/18/2017 0312 Full Code [id number]  Lisbeth Renshaw, MD HOV      Advance Directive Documentation    Flowsheet Row Most Recent Value  Type of Advance Directive Healthcare Power of Attorney  Pre-existing out of facility DNR order (yellow form or pink MOST form) --  "MOST" Form in Place? --      Family Communication: Son on the phone Disposition Plan: Status is: Inpatient  Dispo: The patient is from: Rehab              Anticipated d/c is to: Rehab              Patient currently orthostatic with  blood pressure dropping into the 70s with standing   Difficult to place patient.  No.  Consultants: Neurology  Time spent: 28 minutes  Alford Highland  [facility]            Patient ID: Tenicia Gural, female   DOB: May 24, 1929, 87 y.o.   MRN: [id number]

## 2021-09-23 DIAGNOSIS — N390 Urinary tract infection, site not specified: Secondary | ICD-10-CM

## 2021-09-23 LAB — RESP PANEL BY RT-PCR (FLU A&B, COVID) ARPGX2
Influenza A by PCR: NEGATIVE
Influenza B by PCR: NEGATIVE
SARS Coronavirus 2 by RT PCR: NEGATIVE

## 2021-09-23 LAB — BASIC METABOLIC PANEL
Anion gap: 6 (ref 5–15)
BUN: 14 mg/dL (ref 8–23)
CO2: 30 mmol/L (ref 22–32)
Calcium: 8.5 mg/dL — ABNORMAL LOW (ref 8.9–10.3)
Chloride: 95 mmol/L — ABNORMAL LOW (ref 98–111)
Creatinine, Ser: 0.74 mg/dL (ref 0.44–1.00)
GFR, Estimated: >60 mL/min (ref >60)
Glucose, Bld: 80 mg/dL (ref 70–99)
Potassium: 3.8 mmol/L (ref 3.5–5.1)
Sodium: 131 mmol/L — ABNORMAL LOW (ref 135–145)

## 2021-09-23 MED ORDER — IBUPROFEN 400 MG PO TABS: 800.0000 mg | ORAL_TABLET | Freq: Three times a day (TID) | ORAL | 0 refills | Status: DC | PRN

## 2021-09-23 MED ORDER — SULFAMETHOXAZOLE-TRIMETHOPRIM 800-160 MG PO TABS: 1.0000 | ORAL_TABLET | Freq: Two times a day (BID) | ORAL | 0 refills | Status: AC

## 2021-09-23 MED ORDER — MIDODRINE HCL 2.5 MG PO TABS: 2.5000 mg | ORAL_TABLET | Freq: Every day | ORAL | 0 refills | Status: DC | PRN

## 2021-09-23 MED ORDER — GABAPENTIN 300 MG PO CAPS: 300.0000 mg | ORAL_CAPSULE | Freq: Three times a day (TID) | ORAL | 0 refills | Status: AC

## 2021-09-23 MED ORDER — CITALOPRAM HYDROBROMIDE 10 MG PO TABS: 10.0000 mg | ORAL_TABLET | Freq: Every day | ORAL | 0 refills | Status: DC

## 2021-09-23 MED ORDER — MIDODRINE HCL 5 MG PO TABS: 2.5000 mg | ORAL_TABLET | Freq: Three times a day (TID) | ORAL | Status: DC | PRN

## 2021-09-23 MED ORDER — SULFAMETHOXAZOLE-TRIMETHOPRIM 800-160 MG PO TABS
1.0000 | ORAL_TABLET | Freq: Two times a day (BID) | ORAL | Status: DC
  Administered 2021-09-23: 10:00:00 1 via ORAL
  Filled 2021-09-23 (×2): qty 1

## 2021-09-23 MED ORDER — ENSURE ENLIVE PO LIQD: 237.0000 mL | Freq: Two times a day (BID) | ORAL | 0 refills | Status: DC

## 2021-09-23 MED ORDER — PANTOPRAZOLE SODIUM 40 MG PO TBEC: 40.0000 mg | DELAYED_RELEASE_TABLET | Freq: Every day | ORAL | 0 refills | Status: DC

## 2021-09-23 MED ORDER — GABAPENTIN 100 MG PO CAPS: 200.0000 mg | ORAL_CAPSULE | Freq: Three times a day (TID) | ORAL | 0 refills | Status: DC

## 2021-09-23 NOTE — Progress Notes (Signed)
Patient OOF in stable condition via EMS.

## 2021-09-23 NOTE — Discharge Summary (Signed)
[facility] [place] [facility]   PATIENT NAME: Briana Lozano    MR#:  [id number]  DATE OF BIRTH:  Jul 27, 1929  DATE OF ADMISSION:  09/19/2021 ADMITTING PHYSICIAN: Ava Swayze, DO  DATE OF DISCHARGE: 09/23/2021  PRIMARY CARE PHYSICIAN: Dorothey Baseman, MD    ADMISSION DIAGNOSIS:  TIA (transient ischemic attack) [G45.9] Bad headache [R51.9] Stroke [facility]) [I63.9]  DISCHARGE DIAGNOSIS:  Neuropathic right facial pain.  Not a TIA. Orthostatic hypotension Hyponatremia Nausea vomiting Hiatal hernia Hypothyroidism unspecified Weakness UTI Intracranial aneurysm  SECONDARY DIAGNOSIS:   Past Medical History:  Diagnosis Date   Arthritis    fingers   Brain aneurysm    COPD (chronic obstructive pulmonary disease) (HCC)    wheezing occasionally   COVID-19 12/19/2019   mild symptoms   Depression    Dyspnea    with exertion   GERD (gastroesophageal reflux disease)    Headache    Heart murmur    followed by PCP   History of hiatal hernia    History of phlebitis    Hypertension    Hypothyroidism    Osteoporosis    Pneumonia    in PAST   Sleep apnea    no CPAP   Vertigo    in past   Wears dentures    full upper    HOSPITAL COURSE:   1.  Orthostatic hypotension.  Continue to hold antihypertensive medications.  I will make midodrine as needed if blood pressure drops below 100 with standing can give a dose of midodrine before working with physical therapy.  Check orthostatic vital signs on a daily basis especially before working with physical therapy. 2.  Right facial pain neuropathic in nature.  Seen by neurology and started gabapentin.  Patient will be on 200 mg 3 times a day for 5 more doses and then will increase to 300 mg 3 times a day after that. 3.  Hyponatremia likely multifactorial in nature.  Hydrochlorothiazide discontinued last admission.  Taper Celexa down to 10 mg.  Patient unable to tolerate sodium tablets.  Sodium 131 today.  Continue  ensures between meals and eating. 4.  Nausea vomiting.  Did not tolerate sodium tablets.  As needed Zofran as outpatient. 5.  Hiatal hernia and likely GERD.  I started PPI at night 6.  Hypothyroidism unspecified on levothyroxine 7.  Weakness.  Physical therapy recommending rehab 8.  UTI.  5 more doses of Bactrim upon disposition.  Added on a urine culture that can be followed as outpatient and antibiotics adjusted if needed. 9.  Intracranial aneurysm.  Keep follow-up appointment as previously scheduled with neurology as outpatient  DISCHARGE CONDITIONS:   Satisfactory  CONSULTS OBTAINED:    Neurology  DRUG ALLERGIES:   Allergies  Allergen Reactions   Cortisone Swelling        Penicillins Swelling    Did it involve swelling of the face/tongue/throat, SOB, or low BP? No Did it involve sudden or severe rash/hives, skin peeling, or any reaction on the inside of your mouth or nose? No Did you need to seek medical attention at a hospital or doctor's office? No When did it last happen?      7-8 years If all above answers are "NO", may proceed with cephalosporin use.    Valium [Diazepam] Other (See Comments)    Couldn't walk    DISCHARGE MEDICATIONS:   Allergies as of 09/23/2021       Reactions   Cortisone Swelling  Penicillins Swelling   Did it involve swelling of the face/tongue/throat, SOB, or low BP? No Did it involve sudden or severe rash/hives, skin peeling, or any reaction on the inside of your mouth or nose? No Did you need to seek medical attention at a hospital or doctor's office? No When did it last happen?      7-8 years If all above answers are "NO", may proceed with cephalosporin use.   Valium [diazepam] Other (See Comments)   Couldn't walk        Medication List     STOP taking these medications    bisoprolol 5 MG tablet Commonly known as: ZEBETA   divalproex 125 MG DR tablet Commonly known as: DEPAKOTE   losartan 50 MG tablet Commonly known  as: COZAAR       TAKE these medications    acetaminophen 650 MG CR tablet Commonly known as: TYLENOL Take 1,000 mg by mouth every 8 (eight) hours as needed for pain.   bisacodyl 10 MG suppository Commonly known as: DULCOLAX Place 1 suppository (10 mg total) rectally daily as needed for moderate constipation.   citalopram 10 MG tablet Commonly known as: CELEXA Take 1 tablet (10 mg total) by mouth daily. Start taking on: September 24, 2021 What changed:  medication strength how much to take   cyanocobalamin 1000 MCG/ML injection Commonly known as: (VITAMIN B-12) Inject 1,000 mcg into the muscle every 30 (thirty) days.   feeding supplement Liqd Take 237 mLs by mouth 2 (two) times daily between meals.   gabapentin 100 MG capsule Commonly known as: NEURONTIN Take 2 capsules (200 mg total) by mouth 3 (three) times daily for 5 doses.   gabapentin 300 MG capsule Commonly known as: NEURONTIN Take 1 capsule (300 mg total) by mouth 3 (three) times daily. Start taking on: September 28, 2021   ibuprofen 400 MG tablet Commonly known as: ADVIL Take 2 tablets (800 mg total) by mouth every 8 (eight) hours as needed for moderate pain (please try tyelenol first).   levothyroxine 75 MCG tablet Commonly known as: SYNTHROID Take 75 mcg by mouth daily before breakfast.   loratadine 10 MG tablet Commonly known as: CLARITIN Take 10 mg by mouth daily.   midodrine 2.5 MG tablet Commonly known as: PROAMATINE Take 1 tablet (2.5 mg total) by mouth daily as needed (orthostatic hypotension, IF SBP drops below 100 with standing can give 1 hour before working with PT).   multivitamin with minerals Tabs tablet Take 1 tablet by mouth daily.   ondansetron 4 MG disintegrating tablet Commonly known as: Zofran ODT Take 1 tablet (4 mg total) by mouth every 8 (eight) hours as needed for nausea or vomiting.   pantoprazole 40 MG tablet Commonly known as: PROTONIX Take 1 tablet (40 mg total) by mouth  at bedtime.   Phazyme Maximum Strength 250 MG Caps Generic drug: Simethicone Take 250-500 mg by mouth daily as needed (gas).   polyethylene glycol 17 g packet Commonly known as: MIRALAX / GLYCOLAX Take 17 g by mouth daily as needed for mild constipation.   PRESERVISION AREDS 2 PO Take 1 capsule by mouth 2 (two) times daily.   senna 8.6 MG Tabs tablet Commonly known as: SENOKOT Take 1 tablet by mouth at bedtime.   sulfamethoxazole-trimethoprim 800-160 MG tablet Commonly known as: BACTRIM DS Take 1 tablet by mouth every 12 (twelve) hours for 5 doses.   SYSTANE OP Place 1 drop into both eyes daily as needed (dry eyes).  DISCHARGE INSTRUCTIONS:   Follow-up team at rehab 1 day  If you experience worsening of your admission symptoms, develop shortness of breath, life threatening emergency, suicidal or homicidal thoughts you must seek medical attention immediately by calling 911 or calling your MD immediately  if symptoms less severe.  You Must read complete instructions/literature along with all the possible adverse reactions/side effects for all the Medicines you take and that have been prescribed to you. Take any new Medicines after you have completely understood and accept all the possible adverse reactions/side effects.   Please note  You were cared for by a hospitalist during your hospital stay. If you have any questions about your discharge medications or the care you received while you were in the hospital after you are discharged, you can call the unit and asked to speak with the hospitalist on call if the hospitalist that took care of you is not available. Once you are discharged, your primary care physician will handle any further medical issues. Please note that NO REFILLS for any discharge medications will be authorized once you are discharged, as it is imperative that you return to your primary care physician (or establish a relationship with a primary care  physician if you do not have one) for your aftercare needs so that they can reassess your need for medications and monitor your lab values.    Today   CHIEF COMPLAINT:   Chief Complaint  Patient presents with   Headache    HISTORY OF PRESENT ILLNESS:  Briana Lozano  is a 87 y.o. female came back to the hospital with headache and right facial pain   VITAL SIGNS:  Blood pressure (!) 157/65, pulse 63, temperature 97.9 F (36.6 C), temperature source Oral, resp. rate 18, SpO2 96 %.    PHYSICAL EXAMINATION:  GENERAL:  87 y.o.-year-old patient lying in the bed with no acute distress.  EYES: Right eye deviated in HEENT: Head atraumatic, normocephalic. Oropharynx and nasopharynx clear.  LUNGS: Normal breath sounds bilaterally, no wheezing, rales,rhonchi or crepitation. No use of accessory muscles of respiration.  CARDIOVASCULAR: S1, S2 normal. No murmurs, rubs, or gallops.  ABDOMEN: Soft, non-tender, non-distended.  EXTREMITIES: No pedal edema.   PSYCHIATRIC: The patient is alert and oriented x 3.  SKIN: No obvious rash, lesion, or ulcer.   DATA REVIEW:   CBC Recent Labs  Lab 09/20/21 1752  WBC 12.1*  HGB 15.7*  HCT 43.2  PLT 247    Chemistries  Recent Labs  Lab 09/23/21 0435  NA 131*  K 3.8  CL 95*  CO2 30  GLUCOSE 80  BUN 14  CREATININE 0.74  CALCIUM 8.5*    Microbiology Results  Results for orders placed or performed during the hospital encounter of 09/19/21  Resp Panel by RT-PCR (Flu A&B, Covid) Nasopharyngeal Swab     Status: None   Collection Time: 09/20/21  6:35 AM   Specimen: Nasopharyngeal Swab; Nasopharyngeal(NP) swabs in vial transport medium  Result Value Ref Range Status   SARS Coronavirus 2 by RT PCR NEGATIVE NEGATIVE Final    Comment: (NOTE) SARS-CoV-2 target nucleic acids are NOT DETECTED.  The SARS-CoV-2 RNA is generally detectable in upper respiratory specimens during the acute phase of infection. The lowest concentration of  SARS-CoV-2 viral copies this assay can detect is 138 copies/mL. A negative result does not preclude SARS-Cov-2 infection and should not be used as the sole basis for treatment or other patient management decisions. A negative result may occur with  improper specimen collection/handling, submission of specimen other than nasopharyngeal swab, presence of viral mutation(s) within the areas targeted by this assay, and inadequate number of viral copies(<138 copies/mL). A negative result must be combined with clinical observations, patient history, and epidemiological information. The expected result is Negative.  Fact Sheet for Patients:  BloggerCourse.com  Fact Sheet for Healthcare Providers:  SeriousBroker.it  This test is no t yet approved or cleared by the [place] FDA and  has been authorized for detection and/or diagnosis of SARS-CoV-2 by FDA under an Emergency Use Authorization (EUA). This EUA will remain  in effect (meaning this test can be used) for the duration of the COVID-19 declaration under Section 564(b)(1) of the Act, 21 U.S.C.section 360bbb-3(b)(1), unless the authorization is terminated  or revoked sooner.       Influenza A by PCR NEGATIVE NEGATIVE Final   Influenza B by PCR NEGATIVE NEGATIVE Final    Comment: (NOTE) The Xpert Xpress SARS-CoV-2/FLU/RSV plus assay is intended as an aid in the diagnosis of influenza from Nasopharyngeal swab specimens and should not be used as a sole basis for treatment. Nasal washings and aspirates are unacceptable for Xpert Xpress SARS-CoV-2/FLU/RSV testing.  Fact Sheet for Patients: BloggerCourse.com  Fact Sheet for Healthcare Providers: SeriousBroker.it  This test is not yet approved or cleared by the [place] FDA and has been authorized for detection and/or diagnosis of SARS-CoV-2 by FDA under an Emergency Use  Authorization (EUA). This EUA will remain in effect (meaning this test can be used) for the duration of the COVID-19 declaration under Section 564(b)(1) of the Act, 21 U.S.C. section 360bbb-3(b)(1), unless the authorization is terminated or revoked.  Performed at [facility], [address], [place], [place] [address]       Management plans discussed with the patient, family and they are in agreement.  CODE STATUS:     Code Status Orders  (From admission, onward)           Start     Ordered   09/19/21 1511  Full code  Continuous        09/19/21 1511           Code Status History     Date Active Date Inactive Code Status Order ID Comments User Context   09/09/2021 0959 09/14/2021 2205 Full Code [id number]  Lucile Shutters, MD ED   04/17/2017 1320 04/18/2017 0312 Full Code [id number]  Lisbeth Renshaw, MD HOV      Advance Directive Documentation    Flowsheet Row Most Recent Value  Type of Advance Directive Healthcare Power of Attorney  Pre-existing out of facility DNR order (yellow form or pink MOST form) --  "MOST" Form in Place? --       TOTAL TIME TAKING CARE OF THIS PATIENT: 33 minutes.    Alford Highland M.D on 09/23/2021 at 10:04 AM  Between 7am to 6pm - Pager - [phone]  After 6pm go to [email or web address] - password [email or web address]  [facility]  CC: Primary care physician; Dorothey Baseman, MD

## 2021-09-23 NOTE — Care Management Important Message (Signed)
Important Message  Patient Details  Name: Briana Lozano MRN: [id number] Date of Birth: [birth year]   Medicare Important Message Given:  Yes     Johnell Comings 09/23/2021, 11:43 AM

## 2021-09-23 NOTE — TOC Progression Note (Signed)
Transition of Care [facility]) - Progression Note    Patient Details  Name: Briana Lozano MRN: [id number] Date of Birth: 09-Feb-1929  [facility] CM/SW Contact  Caryn Section, RN Phone Number: 09/23/2021, 1:03 PM  Clinical Narrative:   Patient has auth from [facility] to return to facility.  Tammy at [facility] aware, patient can go to room 612A.  EMS will transport patient, 2nd on list, but their trucks are committed at this point.  [facility], care team and patient aware.    Expected Discharge Plan: Skilled Nursing Facility Barriers to Discharge: Continued Medical Work up  Expected Discharge Plan and Services Expected Discharge Plan: Skilled Nursing Facility   Discharge Planning Services: CM Consult Post Acute Care Choice: Skilled Nursing Facility ([facility] Living arrangements for the past 2 months: Skilled Nursing Facility Expected Discharge Date: 09/23/21               DME Arranged:  (N/A)         HH Arranged:  (n/a)           Social Determinants of Health (SDOH) Interventions    Readmission Risk Interventions No flowsheet data found.

## 2021-09-25 LAB — URINE CULTURE
Culture: >=100000 — AB
Special Requests: NORMAL

## 2022-05-25 ENCOUNTER — Inpatient Hospital Stay
Admission: EM | Admit: 2022-05-25 | Discharge: 2022-05-29 | DRG: 093 | Disposition: A | Discharge status: Home Health | Payer: Medicare Other | Attending: Internal Medicine | Admitting: Internal Medicine

## 2022-05-25 ENCOUNTER — Emergency Department: Payer: Medicare Other

## 2022-05-25 ENCOUNTER — Other Ambulatory Visit: Payer: Self-pay

## 2022-05-25 DIAGNOSIS — Z79899 Other long term (current) drug therapy: Secondary | ICD-10-CM

## 2022-05-25 DIAGNOSIS — G629 Polyneuropathy, unspecified: Secondary | ICD-10-CM | POA: Diagnosis present

## 2022-05-25 DIAGNOSIS — Z888 Allergy status to other drugs, medicaments and biological substances status: Secondary | ICD-10-CM

## 2022-05-25 DIAGNOSIS — Z79890 Hormone replacement therapy (postmenopausal): Secondary | ICD-10-CM

## 2022-05-25 DIAGNOSIS — R112 Nausea with vomiting, unspecified: Secondary | ICD-10-CM | POA: Diagnosis not present

## 2022-05-25 DIAGNOSIS — E039 Hypothyroidism, unspecified: Secondary | ICD-10-CM | POA: Diagnosis present

## 2022-05-25 DIAGNOSIS — Z961 Presence of intraocular lens: Secondary | ICD-10-CM | POA: Diagnosis present

## 2022-05-25 DIAGNOSIS — J449 Chronic obstructive pulmonary disease, unspecified: Secondary | ICD-10-CM | POA: Diagnosis present

## 2022-05-25 DIAGNOSIS — K219 Gastro-esophageal reflux disease without esophagitis: Secondary | ICD-10-CM | POA: Diagnosis present

## 2022-05-25 DIAGNOSIS — R011 Cardiac murmur, unspecified: Secondary | ICD-10-CM | POA: Diagnosis present

## 2022-05-25 DIAGNOSIS — Z823 Family history of stroke: Secondary | ICD-10-CM

## 2022-05-25 DIAGNOSIS — G4730 Sleep apnea, unspecified: Secondary | ICD-10-CM | POA: Diagnosis present

## 2022-05-25 DIAGNOSIS — I160 Hypertensive urgency: Secondary | ICD-10-CM | POA: Diagnosis present

## 2022-05-25 DIAGNOSIS — Z880 Allergy status to penicillin: Secondary | ICD-10-CM

## 2022-05-25 DIAGNOSIS — Z96651 Presence of right artificial knee joint: Secondary | ICD-10-CM | POA: Diagnosis present

## 2022-05-25 DIAGNOSIS — F32A Depression, unspecified: Secondary | ICD-10-CM | POA: Diagnosis present

## 2022-05-25 DIAGNOSIS — R0902 Hypoxemia: Secondary | ICD-10-CM | POA: Diagnosis present

## 2022-05-25 DIAGNOSIS — Z9841 Cataract extraction status, right eye: Secondary | ICD-10-CM

## 2022-05-25 DIAGNOSIS — Z8616 Personal history of COVID-19: Secondary | ICD-10-CM

## 2022-05-25 DIAGNOSIS — I671 Cerebral aneurysm, nonruptured: Principal | ICD-10-CM | POA: Diagnosis present

## 2022-05-25 DIAGNOSIS — Z9842 Cataract extraction status, left eye: Secondary | ICD-10-CM

## 2022-05-25 DIAGNOSIS — I10 Essential (primary) hypertension: Secondary | ICD-10-CM | POA: Diagnosis present

## 2022-05-25 DIAGNOSIS — K449 Diaphragmatic hernia without obstruction or gangrene: Secondary | ICD-10-CM | POA: Diagnosis present

## 2022-05-25 LAB — CBC
HCT: 43.1 % (ref 36.0–46.0)
Hemoglobin: 14.7 g/dL (ref 12.0–15.0)
MCH: 33 pg (ref 26.0–34.0)
MCHC: 34.1 g/dL (ref 30.0–36.0)
MCV: 96.6 fL (ref 80.0–100.0)
Platelets: 210 10*3/uL (ref 150–400)
RBC: 4.46 MIL/uL (ref 3.87–5.11)
RDW: 13.7 % (ref 11.5–15.5)
WBC: 5.1 10*3/uL (ref 4.0–10.5)
nRBC: 0 % (ref 0.0–0.2)

## 2022-05-25 NOTE — ED Triage Notes (Signed)
Pt presents to ER via ems from home where she lives with her son. Per ems, pt has had HA and NV that has been going on for last 24-48 hrs.  Pt has hx of brain aneurism and has intermittent HA, but this HA is worse than normal.  Pt also dx with UTI 6/13.  Pt hypertensive w/ems.  Pt given 400 cc LR w/ems, 4mg  zofran.   Pt is A&O x4 at this time in NAD.

## 2022-05-26 ENCOUNTER — Observation Stay: Payer: Medicare Other

## 2022-05-26 ENCOUNTER — Encounter: Payer: Self-pay | Admitting: Internal Medicine

## 2022-05-26 ENCOUNTER — Emergency Department: Payer: Medicare Other

## 2022-05-26 DIAGNOSIS — I10 Essential (primary) hypertension: Secondary | ICD-10-CM | POA: Diagnosis present

## 2022-05-26 DIAGNOSIS — R112 Nausea with vomiting, unspecified: Secondary | ICD-10-CM | POA: Diagnosis present

## 2022-05-26 DIAGNOSIS — K219 Gastro-esophageal reflux disease without esophagitis: Secondary | ICD-10-CM | POA: Diagnosis present

## 2022-05-26 DIAGNOSIS — E039 Hypothyroidism, unspecified: Secondary | ICD-10-CM

## 2022-05-26 DIAGNOSIS — Z961 Presence of intraocular lens: Secondary | ICD-10-CM | POA: Diagnosis present

## 2022-05-26 DIAGNOSIS — F32A Depression, unspecified: Secondary | ICD-10-CM | POA: Diagnosis present

## 2022-05-26 DIAGNOSIS — I671 Cerebral aneurysm, nonruptured: Secondary | ICD-10-CM | POA: Diagnosis present

## 2022-05-26 DIAGNOSIS — R011 Cardiac murmur, unspecified: Secondary | ICD-10-CM | POA: Diagnosis present

## 2022-05-26 DIAGNOSIS — I160 Hypertensive urgency: Secondary | ICD-10-CM | POA: Diagnosis present

## 2022-05-26 DIAGNOSIS — Z9841 Cataract extraction status, right eye: Secondary | ICD-10-CM | POA: Diagnosis not present

## 2022-05-26 DIAGNOSIS — Z96651 Presence of right artificial knee joint: Secondary | ICD-10-CM | POA: Diagnosis present

## 2022-05-26 DIAGNOSIS — Z8616 Personal history of COVID-19: Secondary | ICD-10-CM | POA: Diagnosis not present

## 2022-05-26 DIAGNOSIS — Z79890 Hormone replacement therapy (postmenopausal): Secondary | ICD-10-CM | POA: Diagnosis not present

## 2022-05-26 DIAGNOSIS — G4730 Sleep apnea, unspecified: Secondary | ICD-10-CM | POA: Diagnosis present

## 2022-05-26 DIAGNOSIS — G629 Polyneuropathy, unspecified: Secondary | ICD-10-CM | POA: Diagnosis present

## 2022-05-26 DIAGNOSIS — Z823 Family history of stroke: Secondary | ICD-10-CM | POA: Diagnosis not present

## 2022-05-26 DIAGNOSIS — Z9842 Cataract extraction status, left eye: Secondary | ICD-10-CM | POA: Diagnosis not present

## 2022-05-26 DIAGNOSIS — Z888 Allergy status to other drugs, medicaments and biological substances status: Secondary | ICD-10-CM | POA: Diagnosis not present

## 2022-05-26 DIAGNOSIS — Z880 Allergy status to penicillin: Secondary | ICD-10-CM | POA: Diagnosis not present

## 2022-05-26 DIAGNOSIS — K449 Diaphragmatic hernia without obstruction or gangrene: Secondary | ICD-10-CM | POA: Diagnosis present

## 2022-05-26 DIAGNOSIS — R0902 Hypoxemia: Secondary | ICD-10-CM | POA: Diagnosis present

## 2022-05-26 DIAGNOSIS — J449 Chronic obstructive pulmonary disease, unspecified: Secondary | ICD-10-CM | POA: Diagnosis present

## 2022-05-26 DIAGNOSIS — Z79899 Other long term (current) drug therapy: Secondary | ICD-10-CM | POA: Diagnosis not present

## 2022-05-26 LAB — CBC
HCT: 40.2 % (ref 36.0–46.0)
Hemoglobin: 13.8 g/dL (ref 12.0–15.0)
MCH: 33.3 pg (ref 26.0–34.0)
MCHC: 34.3 g/dL (ref 30.0–36.0)
MCV: 96.9 fL (ref 80.0–100.0)
Platelets: 199 10*3/uL (ref 150–400)
RBC: 4.15 MIL/uL (ref 3.87–5.11)
RDW: 13.8 % (ref 11.5–15.5)
WBC: 5.3 10*3/uL (ref 4.0–10.5)
nRBC: 0 % (ref 0.0–0.2)

## 2022-05-26 LAB — COMPREHENSIVE METABOLIC PANEL
ALT: 10 U/L (ref 0–44)
ALT: 10 U/L (ref 0–44)
AST: 21 U/L (ref 15–41)
AST: 20 U/L (ref 15–41)
Albumin: 3.9 g/dL (ref 3.5–5.0)
Albumin: 3.6 g/dL (ref 3.5–5.0)
Alkaline Phosphatase: 53 U/L (ref 38–126)
Alkaline Phosphatase: 48 U/L (ref 38–126)
Anion gap: 9 (ref 5–15)
Anion gap: 9 (ref 5–15)
BUN: 9 mg/dL (ref 8–23)
BUN: 9 mg/dL (ref 8–23)
CO2: 28 mmol/L (ref 22–32)
CO2: 27 mmol/L (ref 22–32)
Calcium: 9.1 mg/dL (ref 8.9–10.3)
Calcium: 8.9 mg/dL (ref 8.9–10.3)
Chloride: 96 mmol/L — ABNORMAL LOW (ref 98–111)
Chloride: 98 mmol/L (ref 98–111)
Creatinine, Ser: 0.58 mg/dL (ref 0.44–1.00)
Creatinine, Ser: 0.53 mg/dL (ref 0.44–1.00)
GFR, Estimated: >60 mL/min (ref >60)
GFR, Estimated: >60 mL/min (ref >60)
Glucose, Bld: 139 mg/dL — ABNORMAL HIGH (ref 70–99)
Glucose, Bld: 147 mg/dL — ABNORMAL HIGH (ref 70–99)
Potassium: 3.2 mmol/L — ABNORMAL LOW (ref 3.5–5.1)
Potassium: 3.3 mmol/L — ABNORMAL LOW (ref 3.5–5.1)
Sodium: 133 mmol/L — ABNORMAL LOW (ref 135–145)
Sodium: 134 mmol/L — ABNORMAL LOW (ref 135–145)
Total Bilirubin: 1.1 mg/dL (ref 0.3–1.2)
Total Bilirubin: 0.9 mg/dL (ref 0.3–1.2)
Total Protein: 7.2 g/dL (ref 6.5–8.1)
Total Protein: 7 g/dL (ref 6.5–8.1)

## 2022-05-26 LAB — URINALYSIS, ROUTINE W REFLEX MICROSCOPIC
Bilirubin Urine: NEGATIVE
Glucose, UA: NEGATIVE mg/dL
Hgb urine dipstick: NEGATIVE
Ketones, ur: 5 mg/dL — AB
Leukocytes,Ua: NEGATIVE
Nitrite: NEGATIVE
Protein, ur: NEGATIVE mg/dL
Specific Gravity, Urine: >1.046 — ABNORMAL HIGH (ref 1.005–1.030)
pH: 7 (ref 5.0–8.0)

## 2022-05-26 LAB — TROPONIN I (HIGH SENSITIVITY)
Troponin I (High Sensitivity): 9 ng/L (ref <18)
Troponin I (High Sensitivity): 13 ng/L (ref <18)

## 2022-05-26 LAB — LIPASE, BLOOD: Lipase: 25 U/L (ref 11–51)

## 2022-05-26 LAB — MAGNESIUM: Magnesium: 2.1 mg/dL (ref 1.7–2.4)

## 2022-05-26 MED ORDER — POTASSIUM CHLORIDE 10 MEQ/100ML IV SOLN: 10.0000 meq | INTRAVENOUS | Status: DC

## 2022-05-26 MED ORDER — PANTOPRAZOLE SODIUM 40 MG IV SOLR
40.0000 mg | Freq: Two times a day (BID) | INTRAVENOUS | Status: DC
  Administered 2022-05-26 – 2022-05-29 (×6): 40 mg via INTRAVENOUS
  Filled 2022-05-26 (×6): qty 10

## 2022-05-26 MED ORDER — LABETALOL HCL 5 MG/ML IV SOLN
10.0000 mg | INTRAVENOUS | Status: DC | PRN
  Administered 2022-05-26 – 2022-05-29 (×6): 10 mg via INTRAVENOUS
  Filled 2022-05-26 (×6): qty 4

## 2022-05-26 MED ORDER — PROCHLORPERAZINE EDISYLATE 10 MG/2ML IJ SOLN: 5.0000 mg | Freq: Four times a day (QID) | INTRAMUSCULAR | Status: DC | PRN

## 2022-05-26 MED ORDER — SODIUM CHLORIDE 0.9 % IV SOLN: INTRAVENOUS | Status: DC

## 2022-05-26 MED ORDER — LEVOTHYROXINE SODIUM 50 MCG PO TABS
75.0000 ug | ORAL_TABLET | Freq: Every day | ORAL | Status: DC
  Administered 2022-05-27 – 2022-05-29 (×3): 75 ug via ORAL
  Filled 2022-05-26 (×3): qty 1

## 2022-05-26 MED ORDER — PROCHLORPERAZINE EDISYLATE 10 MG/2ML IJ SOLN
5.0000 mg | Freq: Once | INTRAMUSCULAR | Status: AC
  Administered 2022-05-26: 5 mg via INTRAVENOUS
  Filled 2022-05-26: qty 2

## 2022-05-26 MED ORDER — HYDRALAZINE HCL 20 MG/ML IJ SOLN
10.0000 mg | Freq: Once | INTRAMUSCULAR | Status: AC
  Administered 2022-05-26: 10 mg via INTRAVENOUS
  Filled 2022-05-26: qty 1

## 2022-05-26 MED ORDER — BISOPROLOL FUMARATE 5 MG PO TABS
2.5000 mg | ORAL_TABLET | Freq: Every day | ORAL | Status: DC
  Administered 2022-05-26 – 2022-05-29 (×4): 2.5 mg via ORAL
  Filled 2022-05-26 (×4): qty 0.5

## 2022-05-26 MED ORDER — POTASSIUM CHLORIDE CRYS ER 20 MEQ PO TBCR
40.0000 meq | EXTENDED_RELEASE_TABLET | Freq: Once | ORAL | Status: AC
  Administered 2022-05-26: 40 meq via ORAL
  Filled 2022-05-26: qty 2

## 2022-05-26 MED ORDER — METOCLOPRAMIDE HCL 5 MG/ML IJ SOLN
5.0000 mg | Freq: Once | INTRAMUSCULAR | Status: AC
  Administered 2022-05-26: 5 mg via INTRAVENOUS
  Filled 2022-05-26: qty 2

## 2022-05-26 MED ORDER — POTASSIUM CHLORIDE IN NACL 20-0.9 MEQ/L-% IV SOLN
INTRAVENOUS | Status: DC
  Filled 2022-05-26: qty 1000

## 2022-05-26 MED ORDER — ONDANSETRON HCL 4 MG/2ML IJ SOLN
4.0000 mg | Freq: Once | INTRAMUSCULAR | Status: AC
  Administered 2022-05-26: 4 mg via INTRAVENOUS
  Filled 2022-05-26: qty 2

## 2022-05-26 MED ORDER — ACETAMINOPHEN 325 MG PO TABS
650.0000 mg | ORAL_TABLET | ORAL | Status: DC | PRN
  Administered 2022-05-29: 650 mg via ORAL
  Filled 2022-05-26: qty 2

## 2022-05-26 MED ORDER — MORPHINE SULFATE (PF) 4 MG/ML IV SOLN
4.0000 mg | Freq: Once | INTRAVENOUS | Status: AC
  Administered 2022-05-26: 4 mg via INTRAVENOUS
  Filled 2022-05-26: qty 1

## 2022-05-26 MED ORDER — GABAPENTIN 300 MG PO CAPS
300.0000 mg | ORAL_CAPSULE | Freq: Three times a day (TID) | ORAL | Status: DC
  Administered 2022-05-26 – 2022-05-29 (×10): 300 mg via ORAL
  Filled 2022-05-26 (×10): qty 1

## 2022-05-26 MED ORDER — IOHEXOL 350 MG/ML SOLN
75.0000 mL | Freq: Once | INTRAVENOUS | Status: AC | PRN
  Administered 2022-05-26: 75 mL via INTRAVENOUS

## 2022-05-26 MED ORDER — LABETALOL HCL 5 MG/ML IV SOLN
10.0000 mg | Freq: Once | INTRAVENOUS | Status: AC
  Administered 2022-05-26: 10 mg via INTRAVENOUS
  Filled 2022-05-26: qty 4

## 2022-05-26 MED ORDER — ONDANSETRON HCL 4 MG PO TABS: 4.0000 mg | ORAL_TABLET | Freq: Four times a day (QID) | ORAL | Status: DC | PRN

## 2022-05-26 MED ORDER — POTASSIUM CHLORIDE 2 MEQ/ML IV SOLN
INTRAVENOUS | Status: DC
  Filled 2022-05-26: qty 1000

## 2022-05-26 MED ORDER — POTASSIUM CHLORIDE 2 MEQ/ML IV SOLN
INTRAVENOUS | Status: DC
  Filled 2022-05-26 (×3): qty 1000

## 2022-05-26 MED ORDER — CITALOPRAM HYDROBROMIDE 20 MG PO TABS
10.0000 mg | ORAL_TABLET | Freq: Every day | ORAL | Status: DC
  Administered 2022-05-26 – 2022-05-29 (×4): 10 mg via ORAL
  Filled 2022-05-26 (×4): qty 1

## 2022-05-26 MED ORDER — ONDANSETRON HCL 4 MG/2ML IJ SOLN: 4.0000 mg | Freq: Four times a day (QID) | INTRAMUSCULAR | Status: DC | PRN

## 2022-05-26 MED ORDER — DIPHENHYDRAMINE HCL 50 MG/ML IJ SOLN
25.0000 mg | Freq: Once | INTRAMUSCULAR | Status: AC
  Administered 2022-05-26: 25 mg via INTRAVENOUS
  Filled 2022-05-26: qty 1

## 2022-05-26 NOTE — ED Notes (Signed)
Pt cleaned up and purewick adjusted by this Clinical research associate and Caitlyn, NT. Pt also changed in hospital gown. Pt appreciative.

## 2022-05-26 NOTE — ED Notes (Signed)
Patient given cup of water at this time. 

## 2022-05-26 NOTE — ED Provider Notes (Signed)
[facility] Provider Note    Event Date/Time   First MD Initiated Contact with Patient 05/25/22 2343     (approximate)   History   Headache   HPI  Briana Lozano is a 88 y.o. female with history of brain aneurysm, COPD, hypertension, hypothyroidism, GERD who presents to the emergency department EMS for concerns for intermittent headaches, nausea and vomiting that started yesterday.  No diarrhea.  Reports she has been constipated.  No dysuria, hematuria.  Has had previous appendectomy.  Son reports that they have been giving her more gabapentin recently due to increasing headache severity and frequency.  Son reports she has been evaluated for this brain angiogram before but states "no one will touch it".  It appears she has been seen by Dr. Adriana Simas here who referred her to endovascular in [place].  She states she thinks she saw a doctor  Hamm about 10 to 12 months ago.  She states that the headaches do not seem to be much worse than they have been previously.  She denies sudden onset, thunderclap headache.  No new numbness, tingling or focal weakness.  Patient denies any chest pain or shortness of breath.  It appears she was just started on Macrobid 2 days ago by her primary care doctor for UTI.   History provided by patient and son.    Past Medical History:  Diagnosis Date   Arthritis    fingers   Brain aneurysm    COPD (chronic obstructive pulmonary disease) (HCC)    wheezing occasionally   COVID-19 12/19/2019   mild symptoms   Depression    Dyspnea    with exertion   GERD (gastroesophageal reflux disease)    Headache    Heart murmur    followed by PCP   History of hiatal hernia    History of phlebitis    Hypertension    Hypothyroidism    Osteoporosis    Pneumonia    in PAST   Sleep apnea    no CPAP   Vertigo    in past   Wears dentures    full upper    Past Surgical History:  Procedure Laterality Date   APPENDECTOMY     BREAST  BIOPSY     CATARACT EXTRACTION W/PHACO Left 11/18/2019   Procedure: CATARACT EXTRACTION PHACO AND INTRAOCULAR LENS PLACEMENT (IOC) LEFT 5.77, 00:41.2;  Surgeon: Galen Manila, MD;  Location: [facility];  Service: Ophthalmology;  Laterality: Left;  sleep apnea   CATARACT EXTRACTION W/PHACO Right 01/20/2020   Procedure: CATARACT EXTRACTION PHACO AND INTRAOCULAR LENS PLACEMENT (IOC) RIGHT;  Surgeon: Galen Manila, MD;  Location: [facility];  Service: Ophthalmology;  Laterality: Right;  Lot #[id number] H [place]: 07:12 CDE: 00:59.3    COLONOSCOPY  2002,2008,2015   EYE SURGERY     HEMORRHOID SURGERY  1950   IR ANGIO INTRA EXTRACRAN SEL COM CAROTID INNOMINATE BILAT MOD SED  04/17/2017   IR ANGIO VERTEBRAL SEL VERTEBRAL UNI L MOD SED  04/17/2017   REPLACEMENT TOTAL KNEE Right 04/2005    MEDICATIONS:  Prior to Admission medications   Medication Sig Start Date End Date Taking? Authorizing Provider  acetaminophen (TYLENOL) 650 MG CR tablet Take 1,000 mg by mouth every 8 (eight) hours as needed for pain.    [provider]  bisacodyl (DULCOLAX) 10 MG suppository Place 1 suppository (10 mg total) rectally daily as needed for moderate constipation. 09/14/21   Enedina Finner, MD  citalopram (CELEXA) 10 MG tablet  Take 1 tablet (10 mg total) by mouth daily. 09/24/21   Alford HighlandWieting, Richard, MD  cyanocobalamin (,VITAMIN B-12,) 1000 MCG/ML injection Inject 1,000 mcg into the muscle every 30 (thirty) days.    [provider]  feeding supplement (ENSURE ENLIVE / ENSURE PLUS) LIQD Take 237 mLs by mouth 2 (two) times daily between meals. 09/23/21   Alford HighlandWieting, Richard, MD  gabapentin (NEURONTIN) 100 MG capsule Take 2 capsules (200 mg total) by mouth 3 (three) times daily for 5 doses. 09/23/21 09/25/21  Alford HighlandWieting, Richard, MD  gabapentin (NEURONTIN) 300 MG capsule Take 1 capsule (300 mg total) by mouth 3 (three) times daily. 09/28/21   Alford HighlandWieting, Richard, MD  ibuprofen (ADVIL) 400 MG tablet Take 2 tablets (800  mg total) by mouth every 8 (eight) hours as needed for moderate pain (please try tyelenol first). 09/23/21   Alford HighlandWieting, Richard, MD  levothyroxine (SYNTHROID) 75 MCG tablet Take 75 mcg by mouth daily before breakfast.    [provider]  loratadine (CLARITIN) 10 MG tablet Take 10 mg by mouth daily.    [provider]  midodrine (PROAMATINE) 2.5 MG tablet Take 1 tablet (2.5 mg total) by mouth daily as needed (orthostatic hypotension, IF SBP drops below 100 with standing can give 1 hour before working with PT). 09/23/21   Alford HighlandWieting, Richard, MD  Multiple Vitamin (MULTIVITAMIN WITH MINERALS) TABS tablet Take 1 tablet by mouth daily.    [provider]  Multiple Vitamins-Minerals (PRESERVISION AREDS 2 PO) Take 1 capsule by mouth 2 (two) times daily.    [provider]  ondansetron (ZOFRAN ODT) 4 MG disintegrating tablet Take 1 tablet (4 mg total) by mouth every 8 (eight) hours as needed for nausea or vomiting. 09/07/21   Chesley NoonJessup, Charles, MD  pantoprazole (PROTONIX) 40 MG tablet Take 1 tablet (40 mg total) by mouth at bedtime. 09/23/21   Alford HighlandWieting, Richard, MD  Polyethyl Glycol-Propyl Glycol (SYSTANE OP) Place 1 drop into both eyes daily as needed (dry eyes).     [provider]  polyethylene glycol (MIRALAX / GLYCOLAX) packet Take 17 g by mouth daily as needed for mild constipation.     [provider]  senna (SENOKOT) 8.6 MG TABS tablet Take 1 tablet by mouth at bedtime.    [provider]  Simethicone (PHAZYME MAXIMUM STRENGTH) 250 MG CAPS Take 250-500 mg by mouth daily as needed (gas).    [provider]    Physical Exam   Triage Vital Signs: ED Triage Vitals [05/25/22 2326]  Enc Vitals Group     BP (!) 202/98     Pulse Rate 81     Resp 20     Temp 97.7 F (36.5 C)     Temp Source Oral     SpO2 95 %     Weight 120 lb (54.4 kg)     Height 5\' 1"  (1.549 m)     Head Circumference      Peak Flow      Pain Score 8     Pain Loc       Pain Edu?      Excl. in GC?     Most recent vital signs: Vitals:   05/26/22 0200 05/26/22 0230  BP: (!) 157/76 (!) 160/78  Pulse: 86 84  Resp: (!) 21 (!) 22  Temp:    SpO2: 92% 92%    CONSTITUTIONAL: Alert and oriented and responds appropriately to questions. Well-appearing; well-nourished HEAD: Normocephalic, atraumatic EYES: Conjunctivae clear, pupils appear equal,  sclera nonicteric ENT: normal nose; moist mucous membranes NECK: Supple, normal ROM CARD: RRR; S1 and S2 appreciated; no murmurs, no clicks, no rubs, no gallops RESP: Normal chest excursion without splinting or tachypnea; breath sounds clear and equal bilaterally; no wheezes, no rhonchi, no rales, no hypoxia or respiratory distress, speaking full sentences ABD/GI: Normal bowel sounds; non-distended; soft, non-tender, no rebound, no guarding, no peritoneal signs BACK: The back appears normal EXT: Normal ROM in all joints; no deformity noted, no edema; no cyanosis SKIN: Normal color for age and race; warm; no rash on exposed skin NEURO: Moves all extremities equally, normal speech PSYCH: The patient's mood and manner are appropriate.   ED Results / Procedures / Treatments   LABS: (all labs ordered are listed, but only abnormal results are displayed) Labs Reviewed  COMPREHENSIVE METABOLIC PANEL - Abnormal; Notable for the following components:      Result Value   Sodium 133 (*)    Potassium 3.2 (*)    Chloride 96 (*)    Glucose, Bld 139 (*)    All other components within normal limits  URINALYSIS, ROUTINE W REFLEX MICROSCOPIC - Abnormal; Notable for the following components:   Color, Urine YELLOW (*)    APPearance CLEAR (*)    Specific Gravity, Urine >1.046 (*)    Ketones, ur 5 (*)    All other components within normal limits  CBC  LIPASE, BLOOD  MAGNESIUM  TROPONIN I (HIGH SENSITIVITY)  TROPONIN I (HIGH SENSITIVITY)     EKG:  EKG Interpretation  Date/Time:  Thursday May 25 2022 23:23:14  EDT Ventricular Rate:  80 PR Interval:  211 QRS Duration: 85 QT Interval:  417 QTC Calculation: 482 R Axis:   -39 Text Interpretation: Sinus rhythm Left axis deviation Borderline T wave abnormalities Baseline wander in lead(s) V2 Confirmed by Rochele Raring [phone]) on 05/26/2022 12:47:16 AM         RADIOLOGY: My personal review and interpretation of imaging: CT abdomen pelvis shows no acute abnormality.  CTA head shows enlarging right ICA aneurysm, stable right PCA aneurysm.  No intracranial hemorrhage.  I have personally reviewed all radiology reports.   CT Angio Head W or Wo Contrast  Result Date: 05/26/2022 CLINICAL DATA:  Initial evaluation for acute headache. History of aneurysm, untreated. EXAM: CT ANGIOGRAPHY HEAD TECHNIQUE: Multidetector CT imaging of the head was performed using the standard protocol during bolus administration of intravenous contrast. Multiplanar CT image reconstructions and MIPs were obtained to evaluate the vascular anatomy. RADIATION DOSE REDUCTION: This exam was performed according to the departmental dose-optimization program which includes automated exposure control, adjustment of the mA and/or kV according to patient size and/or use of iterative reconstruction technique. CONTRAST:  75mL OMNIPAQUE IOHEXOL 350 MG/ML SOLN COMPARISON:  Prior Study from 09/09/2021. FINDINGS: CT HEAD Brain: Cerebral volume within normal limits. Chronic microvascular ischemic disease noted involving the supratentorial cerebral white matter. No acute intracranial hemorrhage. No acute large vessel territory infarct. No mass lesion or midline shift. No hydrocephalus or extra-axial fluid collection. Vascular: Large right ICA aneurysm, measuring 2.5 x 3.0 cm, increased in size from prior CTA with this previously measured up to 2.8 cm. No other hyperdense vessel. Scattered calcified atherosclerosis present at skull base. Skull: Scalp soft tissues and calvarium within normal limits. Sinuses:  Chronic right sphenoid sinusitis.  No mastoid effusion. Other: Globes and orbital soft tissues demonstrate no acute finding. Prior bilateral ocular lens replacement. CTA HEAD Anterior circulation: Visualized distal cervical ICAs are irregular with multifocal  irregularity and beading, which could be related FMD and/or chronic hypertension. Left ICA patent through the siphon without significant stenosis. Extremely large aneurysm arising from the cavernous right ICA again seen. The filling portion of the aneurysm sac is increased in size, now measuring 2.2 x 2.1 x 1.0 cm. Mass effect on the adjacent right lateral rectus with associated esotropia. 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 A1 segments patent. Normal anterior communicating artery complex. Anterior cerebral arteries patent without abnormality. Right M1 widely patent. Left M 1 bifurcates at its origin. Both middle cerebral arteries patent and well perfused. Posterior circulation: Both V4 segments patent without significant stenosis. Both PICA patent. Basilar patent to its distal aspect without stenosis. Superior cerebellar arteries patent bilaterally. Left PCA supplied via the basilar. Fetal type origin right PCA. Both PCAs perfused to their distal aspects without stenosis. Venous sinuses: Not well assessed due to timing the contrast bolus. Anatomic variants: Fetal type origin of the right PCA. Review of the MIP images confirms the above findings. IMPRESSION: 1. 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 This is increased in size as compared to prior CTA from 09/09/2021. 2. 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 3. Scattered intracranial atherosclerotic disease without hemodynamically significant stenosis or other acute vascular abnormality. 4. No other acute intracranial  abnormality. 5. Chronic right sphenoid sinusitis. Electronically Signed   By: Rise Mu M.D.   On: 05/26/2022 01:36   CT ABDOMEN PELVIS W CONTRAST  Result Date: 05/26/2022 CLINICAL DATA:  Nausea, vomiting. Abdominal pain, acute, nonlocalized EXAM: CT ABDOMEN AND PELVIS WITH CONTRAST TECHNIQUE: Multidetector CT imaging of the abdomen and pelvis was performed using the standard protocol following bolus administration of intravenous contrast. RADIATION DOSE REDUCTION: This exam was performed according to the departmental dose-optimization program which includes automated exposure control, adjustment of the mA and/or kV according to patient size and/or use of iterative reconstruction technique. CONTRAST:  81mL OMNIPAQUE IOHEXOL 350 MG/ML SOLN COMPARISON:  08/30/2021 FINDINGS: Lower chest: Stable subpleural 5 mm pulmonary nodule within the left lower lobe, benign. Large hiatal hernia. Cardiac size within normal limits. Hepatobiliary: Cholelithiasis without pericholecystic inflammatory change. Liver unremarkable. No intra or extrahepatic biliary ductal dilation Pancreas: Unremarkable Spleen: Unremarkable Adrenals/Urinary Tract: Adrenal glands are unremarkable. Kidneys are normal, without renal calculi, focal lesion, or hydronephrosis. Bladder is unremarkable. Stomach/Bowel: The intrathoracic stomach, small bowel, and large bowel are unremarkable. No evidence of obstruction or focal inflammation. The appendix is not clearly identified and may be absent. No free intraperitoneal gas or fluid. Vascular/Lymphatic: Aortic atherosclerosis. No enlarged abdominal or pelvic lymph nodes. Reproductive: Uterus and bilateral adnexa are unremarkable. Other: No abdominal wall hernia Musculoskeletal: Degenerative changes seen within the lumbar spine. Bilateral L5 pars defects. No acute bone abnormality. No lytic or blastic bone lesion. IMPRESSION: 1. No acute intra-abdominal pathology identified. No definite radiographic  explanation for the patient's reported symptoms. 2. Large hiatal hernia. 3. Cholelithiasis. Aortic Atherosclerosis (ICD10-I70.0). Electronically Signed   By: Helyn Numbers M.D.   On: 05/26/2022 00:51     PROCEDURES:  Critical Care performed: No     .1-3 Lead EKG Interpretation  Performed by: Braden Cimo, Layla Maw, DO Authorized by: Rache Klimaszewski, Layla Maw, DO     Interpretation: normal     ECG rate:  86   ECG rate assessment: normal     Rhythm: sinus  rhythm     Ectopy: none     Conduction: normal       IMPRESSION / MDM / ASSESSMENT AND PLAN / ED COURSE  I reviewed the triage vital signs and the nursing notes.    Patient here with complaints of headache, nausea and vomiting.  Recently being treated for UTI by her PCP.  Has known history of right ICA aneurysm and the son reports was not an endovascular candidate per the physician that they saw in [facility].  The patient is on the cardiac monitor to evaluate for evidence of arrhythmia and/or significant heart rate changes.   DIFFERENTIAL DIAGNOSIS (includes but not limited to):   UTI, viral gastroenteritis, colitis, diverticulitis, cholecystitis, pancreatitis, kidney stone, intracranial hemorrhage, worsening aneurysm, hypertensive urgency/emergency, doubt CVA   Patient's presentation is most consistent with acute presentation with potential threat to life or bodily function.   PLAN: We will obtain CBC, CMP, lipase, urinalysis, troponin x2.  EKG nonischemic.  She is hypertensive here.  Will give IV hydralazine given her history of aneurysm.  Will obtain CTA of the head as well as the abdomen pelvis.  Will give IV fluids, pain and nausea medicine.   MEDICATIONS GIVEN IN ED: Medications  0.9 %  sodium chloride infusion ( Intravenous New Bag/Given 05/26/22 0016)  morphine (PF) 4 MG/ML injection 4 mg (4 mg Intravenous Given 05/26/22 0015)  ondansetron (ZOFRAN) injection 4 mg (4 mg Intravenous Given 05/26/22 0014)  hydrALAZINE  (APRESOLINE) injection 10 mg (10 mg Intravenous Given 05/26/22 0014)  iohexol (OMNIPAQUE) 350 MG/ML injection 75 mL (75 mLs Intravenous Contrast Given 05/26/22 0019)  potassium chloride SA (KLOR-CON M) CR tablet 40 mEq (40 mEq Oral Given 05/26/22 0204)  ondansetron (ZOFRAN) injection 4 mg (4 mg Intravenous Given 05/26/22 0320)  metoCLOPramide (REGLAN) injection 5 mg (5 mg Intravenous Given 05/26/22 0320)     ED COURSE: Patient's labs show no leukocytosis.  Normal hemoglobin.  She has mild hypokalemia.  Given oral replacement.  Normal magnesium level.  Normal LFTs and lipase.  First troponin negative.  CT of the abdomen pelvis reviewed/interpreted by myself radiologist and shows large hiatal hernia, cholelithiasis without cholecystitis.  No other acute abnormality.  She has had previous appendectomy.  CTA of the head reviewed/interpreted by myself and radiologist shows large cavernous right ICA aneurysm that is larger in size compared to CTA in September 2022.  She has a stable unchanged right PCA aneurysm.  No other acute abnormality.  No intracranial hemorrhage.  Given she does not describe this headache as a sudden onset, thunderclap headache and she is neurologically intact, my suspicion for sentinel bleed, subarachnoid is low.  She states that she has had these headaches for some time and feels like they are just progressively worsening but not in a sudden onset, severe manner.  I did discuss her case with Dr. Myer Haff on-call for neurosurgery.  He states that this would not be able to be managed at [facility] but does not seem to need emergent transfer given no subarachnoid hemorrhage, neurologic deficits and that he can help get her set up to see endovascular at [facility] as an outpatient.  Family is comfortable with this plan and would like a second opinion.  Urinalysis pending.  Will p.o. challenge.    3:17 AM  Pt's urine shows no sign of infection.  Son states she is vomiting again.  She is not complaining of  any headache.  I am not convinced that the nausea and vomiting  is all from her headache and aneurysm.  Could be from a viral illness or recent UTI.  We will add on a urine culture.  Will give second dose of Zofran and Reglan.  We will continue hydration.  Will admit for intractable vomiting.  CONSULTS: Consulted Dr. Myer Haff with neurosurgery.  He will refer to endovascular at [facility] for second opinion.   Consulted and discussed patient's case with hospitalist, Dr. Toniann Fail.  I have recommended admission and consulting physician agrees and will place admission orders.  Patient (and family if present) agree with this plan.   I reviewed all nursing notes, vitals, pertinent previous records.  All labs, EKGs, imaging ordered have been independently reviewed and interpreted by myself.   OUTSIDE RECORDS REVIEWED: Reviewed patient's previous admission in October 2022.       FINAL CLINICAL IMPRESSION(S) / ED DIAGNOSES   Final diagnoses:  Nausea and vomiting in adult  Right internal carotid artery aneurysm  Hypertension, unspecified type     Rx / DC Orders   ED Discharge Orders     None        Note:  This document was prepared using Dragon voice recognition software and may include unintentional dictation errors.   Tilley Faeth, Layla Maw, DO 05/26/22 [id number]

## 2022-05-26 NOTE — ED Notes (Signed)
Pt placed on purewick, fluids stopped at and started at 75ml. Pt is dry heaving, so provider was contacted about medication. Compazine ordered and given. Pt placed on 2lpm O2 due to being at 88-89% upon transfer.

## 2022-05-26 NOTE — Consult Note (Signed)
Referring Physician:  No referring provider defined for this encounter.  Primary Physician:  Juluis Pitch, MD  Chief Complaint:  nausea, double vision  History of Present Illness: 05/26/2022 Briana Lozano is a 88 y.o. female who presents with the chief complaint of nausea and double vision.  She has recently had abdominal pain and nausea as well.  She reports several years of worsening double vision, and is now unable to move her R eye.  She has numbness in her R face and tongue.  She is otherwise well. She does report headaches.   The symptoms are causing a significant impact on the patient's life.   Review of Systems:  A 10 point review of systems is negative, except for the pertinent positives and negatives detailed in the HPI.  Past Medical History: Past Medical History:  Diagnosis Date   Arthritis    fingers   Brain aneurysm    COPD (chronic obstructive pulmonary disease) (HCC)    wheezing occasionally   COVID-19 12/19/2019   mild symptoms   Depression    Dyspnea    with exertion   GERD (gastroesophageal reflux disease)    Headache    Heart murmur    followed by PCP   History of hiatal hernia    History of phlebitis    Hypertension    Hypothyroidism    Osteoporosis    Pneumonia    in PAST   Sleep apnea    no CPAP   Vertigo    in past   Wears dentures    full upper    Past Surgical History: Past Surgical History:  Procedure Laterality Date   APPENDECTOMY     BREAST BIOPSY     CATARACT EXTRACTION W/PHACO Left 11/18/2019   Procedure: CATARACT EXTRACTION PHACO AND INTRAOCULAR LENS PLACEMENT (IOC) LEFT 5.77, 00:41.2;  Surgeon: Birder Robson, MD;  Location: [place];  Service: Ophthalmology;  Laterality: Left;  sleep apnea   CATARACT EXTRACTION W/PHACO Right 01/20/2020   Procedure: CATARACT EXTRACTION PHACO AND INTRAOCULAR LENS PLACEMENT (IOC) RIGHT;  Surgeon: Birder Robson, MD;  Location: [facility];  Service: Ophthalmology;   Laterality: Right;  Lot [id number] H [place]: 07:12 CDE: 00:59.3    COLONOSCOPY  2002,2008,2015   EYE SURGERY     HEMORRHOID SURGERY  1950   IR ANGIO INTRA EXTRACRAN SEL COM CAROTID INNOMINATE BILAT MOD SED  04/17/2017   IR ANGIO VERTEBRAL SEL VERTEBRAL UNI L MOD SED  04/17/2017   REPLACEMENT TOTAL KNEE Right 04/2005    Allergies: Allergies as of 05/25/2022 - Review Complete 05/25/2022  Allergen Reaction Noted   Cortisone Swelling 05/20/2014   Penicillins Swelling 04/17/2017   Valium [diazepam] Other (See Comments) 04/17/2017    Medications:  Current Facility-Administered Medications:    acetaminophen (TYLENOL) tablet 650 mg, 650 mg, Oral, Q4H PRN, Vance Gather B, MD   bisoprolol (ZEBETA) tablet 2.5 mg, 2.5 mg, Oral, Daily, Hal Hope, Arshad N, MD, 2.5 mg at 05/26/22 0935   citalopram (CELEXA) tablet 10 mg, 10 mg, Oral, Daily, Rise Patience, MD, 10 mg at 05/26/22 0935   gabapentin (NEURONTIN) capsule 300 mg, 300 mg, Oral, TID, Rise Patience, MD, 300 mg at 05/26/22 1543   labetalol (NORMODYNE) injection 10 mg, 10 mg, Intravenous, Q2H PRN, Rise Patience, MD, 10 mg at 05/26/22 1542   lactated ringers 1,000 mL with potassium chloride 40 mEq infusion, , Intravenous, Continuous, Patrecia Pour, MD, Last Rate: 50 mL/hr at 05/26/22 0934, New Bag at 05/26/22  6270   levothyroxine (SYNTHROID) tablet 75 mcg, 75 mcg, Oral, Q0600, Eduard Clos, MD   ondansetron [facility]) tablet 4 mg, 4 mg, Oral, Q6H PRN **OR** ondansetron (ZOFRAN) injection 4 mg, 4 mg, Intravenous, Q6H PRN, Eduard Clos, MD   prochlorperazine (COMPAZINE) injection 5 mg, 5 mg, Intravenous, Q6H PRN, Eduard Clos, MD  Current Outpatient Medications:    acetaminophen (TYLENOL) 650 MG CR tablet, Take 1,000 mg by mouth every 8 (eight) hours as needed for pain., Disp: , Rfl:    bisacodyl (DULCOLAX) 10 MG suppository, Place 1 suppository (10 mg total) rectally daily as needed for moderate constipation.,  Disp: 12 suppository, Rfl: 0   bisoprolol (ZEBETA) 5 MG tablet, Take 0.5 tablets by mouth daily., Disp: , Rfl:    citalopram (CELEXA) 10 MG tablet, Take 1 tablet (10 mg total) by mouth daily., Disp: 30 tablet, Rfl: 0   cyanocobalamin (,VITAMIN B-12,) 1000 MCG/ML injection, Inject 1,000 mcg into the muscle every 30 (thirty) days., Disp: , Rfl:    feeding supplement (ENSURE ENLIVE / ENSURE PLUS) LIQD, Take 237 mLs by mouth 2 (two) times daily between meals., Disp: 14220 mL, Rfl: 0   gabapentin (NEURONTIN) 300 MG capsule, Take 1 capsule (300 mg total) by mouth 3 (three) times daily., Disp: 90 capsule, Rfl: 0   ibuprofen (ADVIL) 400 MG tablet, Take 2 tablets (800 mg total) by mouth every 8 (eight) hours as needed for moderate pain (please try tyelenol first). (Patient taking differently: Take 600 mg by mouth every 6 (six) hours as needed for moderate pain (please try tyelenol first).), Disp: 20 tablet, Rfl: 0   levothyroxine (SYNTHROID) 75 MCG tablet, Take 75 mcg by mouth daily before breakfast., Disp: , Rfl:    midodrine (PROAMATINE) 2.5 MG tablet, Take 1 tablet (2.5 mg total) by mouth daily as needed (orthostatic hypotension, IF SBP drops below 100 with standing can give 1 hour before working with PT)., Disp: 30 tablet, Rfl: 0   nitrofurantoin, macrocrystal-monohydrate, (MACROBID) 100 MG capsule, Take 100 mg by mouth 2 (two) times daily., Disp: , Rfl:    ondansetron (ZOFRAN ODT) 4 MG disintegrating tablet, Take 1 tablet (4 mg total) by mouth every 8 (eight) hours as needed for nausea or vomiting., Disp: 12 tablet, Rfl: 0   pantoprazole (PROTONIX) 40 MG tablet, Take 1 tablet (40 mg total) by mouth at bedtime., Disp: 30 tablet, Rfl: 0   Polyethyl Glycol-Propyl Glycol (SYSTANE OP), Place 1 drop into both eyes daily as needed (dry eyes). , Disp: , Rfl:    polyethylene glycol (MIRALAX / GLYCOLAX) packet, Take 17 g by mouth daily as needed for mild constipation. , Disp: , Rfl:    senna (SENOKOT) 8.6 MG TABS  tablet, Take 1 tablet by mouth at bedtime., Disp: , Rfl:    ZYRTEC ALLERGY 10 MG tablet, Take 10 mg by mouth daily., Disp: , Rfl:    Multiple Vitamin (MULTIVITAMIN WITH MINERALS) TABS tablet, Take 1 tablet by mouth daily. (Patient not taking: Reported on 05/26/2022), Disp: , Rfl:    Multiple Vitamins-Minerals (PRESERVISION AREDS 2 PO), Take 1 capsule by mouth 2 (two) times daily. (Patient not taking: Reported on 05/26/2022), Disp: , Rfl:    Simethicone (PHAZYME MAXIMUM STRENGTH) 250 MG CAPS, Take 250-500 mg by mouth daily as needed (gas). (Patient not taking: Reported on 05/26/2022), Disp: , Rfl:    Social History: Social History   Tobacco Use   Smoking status: Never   Smokeless tobacco: Never  Vaping Use  Vaping Use: Never used  Substance Use Topics   Alcohol use: No   Drug use: No    Family Medical History: Family History  Problem Relation Age of Onset   Stroke Mother    Lung cancer Father    Lung cancer Brother    Breast cancer Neg Hx     Physical Examination: Vitals:   05/26/22 1540 05/26/22 1550  BP:  (!) 133/95  Pulse:  67  Resp:  (!) 24  Temp:    SpO2: 93%      General: Patient is well developed, well nourished, calm, collected, and in no apparent distress.  Psychiatric: Patient is non-anxious.  Head:  Pupils equal, round, and reactive to light.  ENT:  Oral mucosa appears well hydrated.  Neck:   Supple.  Full range of motion.  Respiratory: Patient is breathing without any difficulty.  Extremities: No edema.  Vascular: Palpable pulses in dorsal pedal vessels.  Skin:   On exposed skin, there are no abnormal skin lesions.  NEUROLOGICAL:  General: In no acute distress.   Awake, alert, oriented to person, place, and time.  Pupils 3 to 2 on L, R surgical. 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  Facial sensation diminished V2 and V3 on R. Facial tone is symmetric.  Tongue protrusion is midline.  There is no pronator drift.  ROM of spine:  full.  Strength: Side Biceps Triceps Deltoid Interossei Grip Wrist Ext. Wrist Flex.  R 5 5 5 5 5 5 5   L 5 5 5 5 5 5 5    Side Iliopsoas Quads Hamstring PF DF EHL  R 5 5 5 5 5 5   L 5 5 5 5 5 5     Bilateral upper and lower extremity sensation is intact to light touch. Reflexes are 1+ and symmetric at the biceps, triceps, brachioradialis, patella and achilles. Hoffman's is absent. Gait untested.    Imaging: CTA Head 05/26/22 IMPRESSION: 1. 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 This is increased in size as compared to prior CTA from 09/09/2021. 2. 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 3. Scattered intracranial atherosclerotic disease without hemodynamically significant stenosis or other acute vascular abnormality. 4. No other acute intracranial abnormality. 5. Chronic right sphenoid sinusitis.     Electronically Signed   By: M.D.   On: 05/26/2022 01:36  I have personally reviewed the images and agree with the above interpretation.  Labs:    Latest Ref Rng & Units 05/26/2022    5:09 AM 05/25/2022   11:29 PM 09/20/2021    5:52 PM  CBC  WBC 4.0 - 10.5 K/uL 5.3  5.1  12.1   Hemoglobin 12.0 - 15.0 g/dL 05/28/2022  05/28/2022  05/27/2022   Hematocrit 36.0 - 46.0 % 40.2  43.1  43.2   Platelets 150 - 400 K/uL 199  210  247        Assessment and Plan: Ms. Wack is a pleasant 88 y.o. female with enlarging R cavernous ICA aneurysm without rupture.  She also has a Pcom aneurysm.  - admission and treatment per medicine for abdominal pain - will arrange outpatient follow up with [facility] to evaluate for treatment of enlarging cavernous aneurysm     Jaris Kohles K. 47.8 MD, MPHS Dept. of Neurosurgery

## 2022-05-26 NOTE — H&P (Addendum)
History and Physical    Briana Lozano WUJ:[id number] DOB: 1929-10-09 DOA: 05/25/2022  PCP: Dorothey BasemanBronstein, David, MD  Patient coming from: Home.  Chief Complaint: Nausea vomiting and headache.  HPI: Briana Lozano is a 88 y.o. female with history of hypertension, hypothyroidism intracranial aneurysm has been experiencing frontal headache with nausea vomiting for the last 1 week.  Denies any fever chills.  Recently was treated for UTI.  ED Course: In the ER patient also was complaining of some abdominal discomfort underwent CT angiogram of the head and also CT abdomen pelvis.  The intracranial aneurysm has increased in size and the ER physician did discuss with Dr. [place]call neuro[place]geon.  Patient admitted for further management.  Review of Systems: As per HPI, rest all negative.   Past Medical History:  Diagnosis Date   Arthritis    fingers   Brain aneurysm    COPD (chronic obstructive pulmonary disease) (HCC)    wheezing occasionally   COVID-19 12/19/2019   mild symptoms   Depression    Dyspnea    with exertion   GERD (gastroesophageal reflux disease)    Headache    Heart murmur    followed by PCP   History of hiatal hernia    History of phlebitis    Hypertension    Hypothyroidism    Osteoporosis    Pneumonia    in PAST   Sleep apnea    no CPAP   Vertigo    in past   Wears dentures    full upper    Past Surgical History:  Procedure Laterality Date   APPENDECTOMY     BREAST BIOPSY     CATARACT EXTRACTION W/PHACO Left 11/18/2019   Procedure: CATARACT EXTRACTION PHACO AND INTRAOCULAR LENS PLACEMENT (IOC) LEFT 5.77, 00:41.2;  Surgeon: Galen ManilaPorfilio, William, MD;  Location: [facility];  Service: Ophthalmology;  Laterality: Left;  sleep apnea   CATARACT EXTRACTION W/PHACO Right 01/20/2020   Procedure: CATARACT EXTRACTION PHACO AND INTRAOCULAR LENS PLACEMENT (IOC) RIGHT;  Surgeon: Galen ManilaPorfilio, William, MD;  Location: [facility];  Service: Ophthalmology;   Laterality: Right;  Lot #[id number] H US: 07:12 CDE: 00:59.3    COLONOSCOPY  2002,2008,2015   EYE SURGERY     HEMORRHOID SURGERY  1950   IR ANGIO INTRA EXTRACRAN SEL COM CAROTID INNOMINATE BILAT MOD SED  04/17/2017   IR ANGIO VERTEBRAL SEL VERTEBRAL UNI L MOD SED  04/17/2017   REPLACEMENT TOTAL KNEE Right 04/2005     reports that she has never smoked. She has never used smokeless tobacco. She reports that she does not drink alcohol and does not use drugs.  Allergies  Allergen Reactions   Cortisone Swelling        Penicillins Swelling    Did it involve swelling of the face/tongue/throat, SOB, or low BP? No Did it involve sudden or severe rash/hives, skin peeling, or any reaction on the inside of your mouth or nose? No Did you need to seek medical attention at a hospital or doctor's office? No When did it last happen?      7-8 years If all above answers are "NO", may proceed with cephalosporin use.    Valium [Diazepam] Other (See Comments)    Couldn't walk    Family History  Problem Relation Age of Onset   Stroke Mother    Lung cancer Father    Lung cancer Brother    Breast cancer Neg Hx     Prior to Admission medications   Medication Sig  Start Date End Date Taking? Authorizing Provider  acetaminophen (TYLENOL) 650 MG CR tablet Take 1,000 mg by mouth every 8 (eight) hours as needed for pain.   Yes [provider]  bisacodyl (DULCOLAX) 10 MG suppository Place 1 suppository (10 mg total) rectally daily as needed for moderate constipation. 09/14/21  Yes Enedina Finner, MD  bisoprolol (ZEBETA) 5 MG tablet Take 0.5 tablets by mouth daily. 04/07/22  Yes [provider]  citalopram (CELEXA) 10 MG tablet Take 1 tablet (10 mg total) by mouth daily. 09/24/21  Yes Wieting, Richard, MD  cyanocobalamin (,VITAMIN B-12,) 1000 MCG/ML injection Inject 1,000 mcg into the muscle every 30 (thirty) days.   Yes [provider]  feeding supplement (ENSURE ENLIVE / ENSURE PLUS) LIQD  Take 237 mLs by mouth 2 (two) times daily between meals. 09/23/21  Yes Wieting, Richard, MD  gabapentin (NEURONTIN) 300 MG capsule Take 1 capsule (300 mg total) by mouth 3 (three) times daily. 09/28/21  Yes Wieting, Richard, MD  ibuprofen (ADVIL) 400 MG tablet Take 2 tablets (800 mg total) by mouth every 8 (eight) hours as needed for moderate pain (please try tyelenol first). Patient taking differently: Take 600 mg by mouth every 6 (six) hours as needed for moderate pain (please try tyelenol first). 09/23/21  Yes Alford Highland, MD  levothyroxine (SYNTHROID) 75 MCG tablet Take 75 mcg by mouth daily before breakfast.   Yes [provider]  midodrine (PROAMATINE) 2.5 MG tablet Take 1 tablet (2.5 mg total) by mouth daily as needed (orthostatic hypotension, IF SBP drops below 100 with standing can give 1 hour before working with PT). 09/23/21  Yes Wieting, Richard, MD  nitrofurantoin, macrocrystal-monohydrate, (MACROBID) 100 MG capsule Take 100 mg by mouth 2 (two) times daily. 05/24/22  Yes [provider]  ondansetron (ZOFRAN ODT) 4 MG disintegrating tablet Take 1 tablet (4 mg total) by mouth every 8 (eight) hours as needed for nausea or vomiting. 09/07/21  Yes Chesley Noon, MD  pantoprazole (PROTONIX) 40 MG tablet Take 1 tablet (40 mg total) by mouth at bedtime. 09/23/21  Yes Wieting, Richard, MD  Polyethyl Glycol-Propyl Glycol (SYSTANE OP) Place 1 drop into both eyes daily as needed (dry eyes).    Yes [provider]  polyethylene glycol (MIRALAX / GLYCOLAX) packet Take 17 g by mouth daily as needed for mild constipation.    Yes [provider]  senna (SENOKOT) 8.6 MG TABS tablet Take 1 tablet by mouth at bedtime.   Yes [provider]  ZYRTEC ALLERGY 10 MG tablet Take 10 mg by mouth daily. 04/12/22  Yes [provider]  Multiple Vitamin (MULTIVITAMIN WITH MINERALS) TABS tablet Take 1 tablet by mouth daily. Patient not taking: Reported on 05/26/2022     [provider]  Multiple Vitamins-Minerals (PRESERVISION AREDS 2 PO) Take 1 capsule by mouth 2 (two) times daily. Patient not taking: Reported on 05/26/2022    [provider]  Simethicone (PHAZYME MAXIMUM STRENGTH) 250 MG CAPS Take 250-500 mg by mouth daily as needed (gas). Patient not taking: Reported on 05/26/2022    [provider]    Physical Exam: Constitutional: Moderately built and nourished. Vitals:   05/26/22 0200 05/26/22 0230 05/26/22 0300 05/26/22 0400  BP: (!) 157/76 (!) 160/78 (!) 178/93 (!) 183/90  Pulse: 86 84 96 89  Resp: (!) 21 (!) 22 (!) 22 (!) 21  Temp:      TempSrc:      SpO2: 92% 92% 93% 91%  Weight:  Height:       Eyes: Anicteric no pallor. ENMT: No discharge from the ears eyes nose and mouth. Neck: No mass felt.  No neck rigidity. Respiratory: No rhonchi or crepitations. Cardiovascular: S1-S2 heard. Abdomen: Soft nontender bowel sound present. Musculoskeletal: No edema. Skin: No rash. Neurologic: Alert awake oriented time place and person.  Moves all extremities. Psychiatric: Appears normal.  Normal affect.   Labs on Admission: I have personally reviewed following labs and imaging studies  CBC: Recent Labs  Lab 05/25/22 2329  WBC 5.1  HGB 14.7  HCT 43.1  MCV 96.6  PLT 210   Basic Metabolic Panel: Recent Labs  Lab 05/25/22 2329  NA 133*  K 3.2*  CL 96*  CO2 28  GLUCOSE 139*  BUN 9  CREATININE 0.58  CALCIUM 9.1  MG 2.1   GFR: Estimated Creatinine Clearance: 35.3 mL/min (by C-G formula based on SCr of 0.58 mg/dL). Liver Function Tests: Recent Labs  Lab 05/25/22 2329  AST 21  ALT 10  ALKPHOS 53  BILITOT 1.1  PROT 7.2  ALBUMIN 3.9   Recent Labs  Lab 05/25/22 2329  LIPASE 25   No results for input(s): "AMMONIA" in the last 168 hours. Coagulation Profile: No results for input(s): "INR", "PROTIME" in the last 168 hours. Cardiac Enzymes: No results for input(s): "CKTOTAL", "CKMB",  "CKMBINDEX", "TROPONINI" in the last 168 hours. BNP (last 3 results) No results for input(s): "PROBNP" in the last 8760 hours. HbA1C: No results for input(s): "HGBA1C" in the last 72 hours. CBG: No results for input(s): "GLUCAP" in the last 168 hours. Lipid Profile: No results for input(s): "CHOL", "HDL", "LDLCALC", "TRIG", "CHOLHDL", "LDLDIRECT" in the last 72 hours. Thyroid Function Tests: No results for input(s): "TSH", "T4TOTAL", "FREET4", "T3FREE", "THYROIDAB" in the last 72 hours. Anemia Panel: No results for input(s): "VITAMINB12", "FOLATE", "FERRITIN", "TIBC", "IRON", "RETICCTPCT" in the last 72 hours. Urine analysis:    Component Value Date/Time   COLORURINE YELLOW (A) 05/25/2022 2330   APPEARANCEUR CLEAR (A) 05/25/2022 2330   APPEARANCEUR CLEAR 10/07/2014 0958   LABSPEC >1.046 (H) 05/25/2022 2330   LABSPEC 1.015 10/07/2014 0958   PHURINE 7.0 05/25/2022 2330   GLUCOSEU NEGATIVE 05/25/2022 2330   GLUCOSEU NEGATIVE 10/07/2014 0958   HGBUR NEGATIVE 05/25/2022 2330   BILIRUBINUR NEGATIVE 05/25/2022 2330   BILIRUBINUR NEGATIVE 10/07/2014 0958   KETONESUR 5 (A) 05/25/2022 2330   PROTEINUR NEGATIVE 05/25/2022 2330   NITRITE NEGATIVE 05/25/2022 2330   LEUKOCYTESUR NEGATIVE 05/25/2022 2330   LEUKOCYTESUR NEGATIVE 10/07/2014 0958   Sepsis Labs: @LABRCNTIP (procalcitonin:4,lacticidven:4) )No results found for this or any previous visit (from the past 240 hour(s)).   Radiological Exams on Admission: CT Angio Head W or Wo Contrast  Result Date: 05/26/2022 CLINICAL DATA:  Initial evaluation for acute headache. History of aneurysm, untreated. EXAM: CT ANGIOGRAPHY HEAD TECHNIQUE: Multidetector CT imaging of the head was performed using the standard protocol during bolus administration of intravenous contrast. Multiplanar CT image reconstructions and MIPs were obtained to evaluate the vascular anatomy. RADIATION DOSE REDUCTION: This exam was performed according to the departmental  dose-optimization program which includes automated exposure control, adjustment of the mA and/or kV according to patient size and/or use of iterative reconstruction technique. CONTRAST:  34mL OMNIPAQUE IOHEXOL 350 MG/ML SOLN COMPARISON:  Prior Study from 09/09/2021. FINDINGS: CT HEAD Brain: Cerebral volume within normal limits. Chronic microvascular ischemic disease noted involving the supratentorial cerebral white matter. No acute intracranial hemorrhage. No acute large vessel territory infarct. No mass lesion or  midline shift. No hydrocephalus or extra-axial fluid collection. Vascular: Large right ICA aneurysm, measuring 2.5 x 3.0 cm, increased in size from prior CTA with this previously measured up to 2.8 cm. No other hyperdense vessel. Scattered calcified atherosclerosis present at skull base. Skull: Scalp soft tissues and calvarium within normal limits. Sinuses: Chronic right sphenoid sinusitis.  No mastoid effusion. Other: Globes and orbital soft tissues demonstrate no acute finding. Prior bilateral ocular lens replacement. CTA HEAD Anterior circulation: Visualized distal cervical ICAs are irregular with multifocal irregularity and beading, which could be related FMD and/or chronic hypertension. Left ICA patent through the siphon without significant stenosis. Extremely large aneurysm arising from the cavernous right ICA again seen. The filling portion of the aneurysm sac is increased in size, now measuring 2.2 x 2.1 x 1.0 cm. Mass effect on the adjacent right lateral rectus with associated esotropia. 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 A1 segments patent. Normal anterior communicating artery complex. Anterior cerebral arteries patent without abnormality. Right M1 widely patent. Left M 1 bifurcates at its origin. Both middle cerebral arteries patent and well perfused. Posterior circulation: Both V4 segments patent without significant stenosis. Both PICA  patent. Basilar patent to its distal aspect without stenosis. Superior cerebellar arteries patent bilaterally. Left PCA supplied via the basilar. Fetal type origin right PCA. Both PCAs perfused to their distal aspects without stenosis. Venous sinuses: Not well assessed due to timing the contrast bolus. Anatomic variants: Fetal type origin of the right PCA. Review of the MIP images confirms the above findings. IMPRESSION: 1. 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 This is increased in size as compared to prior CTA from 09/09/2021. 2. 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 3. Scattered intracranial atherosclerotic disease without hemodynamically significant stenosis or other acute vascular abnormality. 4. No other acute intracranial abnormality. 5. Chronic right sphenoid sinusitis. Electronically Signed   By: Rise Mu M.D.   On: 05/26/2022 01:36   CT ABDOMEN PELVIS W CONTRAST  Result Date: 05/26/2022 CLINICAL DATA:  Nausea, vomiting. Abdominal pain, acute, nonlocalized EXAM: CT ABDOMEN AND PELVIS WITH CONTRAST TECHNIQUE: Multidetector CT imaging of the abdomen and pelvis was performed using the standard protocol following bolus administration of intravenous contrast. RADIATION DOSE REDUCTION: This exam was performed according to the departmental dose-optimization program which includes automated exposure control, adjustment of the mA and/or kV according to patient size and/or use of iterative reconstruction technique. CONTRAST:  17mL OMNIPAQUE IOHEXOL 350 MG/ML SOLN COMPARISON:  08/30/2021 FINDINGS: Lower chest: Stable subpleural 5 mm pulmonary nodule within the left lower lobe, benign. Large hiatal hernia. Cardiac size within normal limits. Hepatobiliary: Cholelithiasis without pericholecystic inflammatory change. Liver unremarkable. No intra or extrahepatic biliary  ductal dilation Pancreas: Unremarkable Spleen: Unremarkable Adrenals/Urinary Tract: Adrenal glands are unremarkable. Kidneys are normal, without renal calculi, focal lesion, or hydronephrosis. Bladder is unremarkable. Stomach/Bowel: The intrathoracic stomach, small bowel, and large bowel are unremarkable. No evidence of obstruction or focal inflammation. The appendix is not clearly identified and may be absent. No free intraperitoneal gas or fluid. Vascular/Lymphatic: Aortic atherosclerosis. No enlarged abdominal or pelvic lymph nodes. Reproductive: Uterus and bilateral adnexa are unremarkable. Other: No abdominal wall hernia Musculoskeletal: Degenerative changes seen within the lumbar spine. Bilateral L5 pars defects. No acute bone abnormality. No lytic or blastic bone lesion. IMPRESSION: 1. No acute intra-abdominal pathology identified. No  definite radiographic explanation for the patient's reported symptoms. 2. Large hiatal hernia. 3. Cholelithiasis. Aortic Atherosclerosis (ICD10-I70.0). Electronically Signed   By: Helyn Numbers M.D.   On: 05/26/2022 00:51    EKG: Independently reviewed.  Normal sinus rhythm.  Assessment/Plan Principal Problem:   Nausea & vomiting Active Problems:   Hypothyroidism   Hypertensive urgency    Intractable nausea vomiting with headache with increasing size of intracranial aneurysm -ER patient discussed with Dr. Marcell Barlow neurosurgeon.  Plan was to refer to endovascular surgeon at [facility] as outpatient.  Dr. Marcell Barlow to give further recommendations.  At this time we have aggressive blood pressure control with as needed IV labetalol for systolic blood pressure more than 140 and also continue patient's home dose of bisoprolol.  Compazine as needed for nausea vomiting.  If symptoms does not improve we will discuss with Dr. Marcell Barlow again. Hypothyroidism on Synthroid. Neuropathy on gabapentin.   DVT prophylaxis: SCDs.  Avoiding anticoagulation in the setting of  aneurysm. Code Status: Full code. Family Communication: Patient's son. Disposition Plan: To be determined. Consults called: ER physician discussed with Dr. Marcell Barlow. Admission status: Observation.   Eduard Clos MD [facility] Pager [phone].  If 7PM-7AM, please contact night-coverage [email or web address] Password [facility]  05/26/2022, 4:42 AM

## 2022-05-26 NOTE — ED Notes (Signed)
Pt assisted to bedside commode

## 2022-05-26 NOTE — Progress Notes (Signed)
PROGRESS NOTE  Brief Narrative: Briana Lozano is a 88 y.o. female with a history of HTN, intracranial aneurysm, and hypothyroidism who presented to the ED with progressive headache, nausea and vomiting. She also complained of some abdominal discomfort. CT abd/pelvis showed large hiatal hernia without acute pathology. CTA head revealed enlarging intracranial aneurysm thought to be the cause of patient's headache. She was admitted this morning by Dr. Toniann Fail. Neurosurgery was consulted, though outpatient follow up is recommended. She continues to have intractable vomiting, so started IV fluids this morning  Subjective: This morning, she feels nauseated despite taking zofran and compazine, nothing by mouth yet. Headache improved. No new complaints (examined a couple hours after admission). She was told her oxygen was somewhat low and so she was put on supplemental oxygen, though she denies dyspnea, cough, wheezing.   Objective: BP (!) 142/73   Pulse 65   Temp 97.7 F (36.5 C) (Oral)   Resp 17   Ht 5\' 1"  (1.549 m)   Wt 54.4 kg   SpO2 96%   BMI 22.67 kg/m   Gen: Elderly, pleasant female in no distress Eyes: PERRL, R eye with limited ROM. Pulm: Clear  CV: RRR, no murmur, no JVD, no edema GI: Soft, NT, ND, +BS  Neuro: Alert and oriented. No focal deficits. Skin: No rashes, lesions or ulcers  Assessment & Plan: Intractable nausea and vomiting:  - Continue antiemetics, augmented this morning.  - Trial po as able - Continue IVF  Documented hypoxemia: No symptoms per pt. No lung findings on exam.  - Check CXR, wean oxygen as able.   Enlarging right cavernous ICA aneurysm: Also with smaller PCOM aneurysm. D/w Dr. who confirms her headache is likely attributable to this. He discussed management options with the patient who would like to pursue outpatient follow up. He will discuss with [facility]. No emergent needs.  Otherwise, please see H&P for full  plan.  Myer Haff, MD Pager on amion 05/26/2022, 12:38 PM

## 2022-05-27 DIAGNOSIS — R112 Nausea with vomiting, unspecified: Secondary | ICD-10-CM | POA: Diagnosis not present

## 2022-05-27 LAB — BASIC METABOLIC PANEL
Anion gap: 4 — ABNORMAL LOW (ref 5–15)
BUN: 13 mg/dL (ref 8–23)
CO2: 29 mmol/L (ref 22–32)
Calcium: 9.3 mg/dL (ref 8.9–10.3)
Chloride: 105 mmol/L (ref 98–111)
Creatinine, Ser: 0.72 mg/dL (ref 0.44–1.00)
GFR, Estimated: >60 mL/min (ref >60)
Glucose, Bld: 91 mg/dL (ref 70–99)
Potassium: 4.3 mmol/L (ref 3.5–5.1)
Sodium: 138 mmol/L (ref 135–145)

## 2022-05-27 NOTE — Evaluation (Signed)
Physical Therapy Evaluation Patient Details Name: Briana Lozano MRN: [id number] DOB: Aug 15, 1929 Today's Date: 05/27/2022  History of Present Illness  Pt is a 88 y/o F admitted on 05/25/22 after presenting to the ED with c/o progressive HA, N&V & abdominal discomfort. CT abd/pelvis showed large hiatal hernia without acute pathology. CTA head revealed enlarging intracranial aneurysm thought to be the cause of patient's headache. Neurosurgery was consulted, though outpatient follow up is recommended. PMH: HTN, intracranial aneurysm, hypothyroidism, COPD, COVID 19, depression, dyspnea, HTN, osteoporosis, PNA, sleep apnea, vertigo   Clinical Impression  Pt seen for PT evaluation with pt's family & friends present in room. Prior to admission pt was living in a level entry apartment with family, only home alone a couple hours at a time during the day & pt is ambulatory with rollator. On this date, pt is able to ambulate 1 lap around nursing pod with RW & CGA. Pt appears to be close to her baseline & visitors state pt is doing well. Anticipate pt can d/c home with assistance and HHPT f/u.     Recommendations for follow up therapy are one component of a multi-disciplinary discharge planning process, led by the attending physician.  Recommendations may be updated based on patient status, additional functional criteria and insurance authorization.  Follow Up Recommendations Home health PT    Assistance Recommended at Discharge Intermittent Supervision/Assistance  Patient can return home with the following  A little help with walking and/or transfers;A little help with bathing/dressing/bathroom;Assistance with cooking/housework;Direct supervision/assist for medications management    Equipment Recommendations None recommended by PT  Recommendations for Other Services       Functional Status Assessment Patient has had a recent decline in their functional status and demonstrates the ability to make  significant improvements in function in a reasonable and predictable amount of time.     Precautions / Restrictions Precautions Precautions: Fall Restrictions Weight Bearing Restrictions: No      Mobility  Bed Mobility   Bed Mobility: Supine to Sit     Supine to sit: HOB elevated, Supervision          Transfers Overall transfer level: Needs assistance Equipment used: Rolling walker (2 wheels) Transfers: Sit to/from Stand Sit to Stand: Min guard (cuing for proper hand placement during STS)                Ambulation/Gait Ambulation/Gait assistance: Min guard Gait Distance (Feet): 100 Feet Assistive device: Rolling walker (2 wheels) Gait Pattern/deviations: Decreased step length - right, Decreased step length - left, Decreased stride length Gait velocity: slightly decreased        Stairs            Wheelchair Mobility    Modified Rankin (Stroke Patients Only)       Balance Overall balance assessment: Needs assistance Sitting-balance support: Feet supported, No upper extremity supported Sitting balance-Leahy Scale: Good     Standing balance support: Bilateral upper extremity supported, During functional activity Standing balance-Leahy Scale: Fair                               Pertinent Vitals/Pain Pain Assessment Pain Assessment: No/denies pain    Home Living Family/patient expects to be discharged to:: Private residence Living Arrangements: Children Available Help at Discharge: Family;Available PRN/intermittently (pt only alone a couple hours at a time at home) Type of Home: Apartment Home Access: Level entry Entrance Stairs-Rails: Right;Left;Can reach both Entrance Stairs-Number  of Steps: 2   Home Layout: One level Home Equipment: Tub bench;Cane - quad;Rollator (4 wheels);Rolling Walker (2 wheels);Toilet riser;Hand held shower head      Prior Function Prior Level of Function : Independent/Modified Independent              Mobility Comments: Ambulatory with rollator, endorses 1 fall (fainting spell) in past 6 months. ADLs Comments: family assists with cooking & cleaning     Hand Dominance   Dominant Hand: Right    Extremity/Trunk Assessment   Upper Extremity Assessment Upper Extremity Assessment: Generalized weakness    Lower Extremity Assessment Lower Extremity Assessment: Generalized weakness       Communication   Communication: No difficulties  Cognition Arousal/Alertness: Awake/alert Behavior During Therapy: WFL for tasks assessed/performed Overall Cognitive Status: Within Functional Limits for tasks assessed                                 General Comments: Pleasant lady        General Comments      Exercises     Assessment/Plan    PT Assessment    PT Problem List         PT Treatment Interventions      PT Goals (Current goals can be found in the Care Plan section)  Acute Rehab PT Goals Patient Stated Goal: get better, go home PT Goal Formulation: With patient/family Time For Goal Achievement: 06/10/22 Potential to Achieve Goals: Good    Frequency Min 2X/week     Co-evaluation               AM-PAC PT "6 Clicks" Mobility  Outcome Measure Help needed turning from your back to your side while in a flat bed without using bedrails?: None Help needed moving from lying on your back to sitting on the side of a flat bed without using bedrails?: None Help needed moving to and from a bed to a chair (including a wheelchair)?: A Little Help needed standing up from a chair using your arms (e.g., wheelchair or bedside chair)?: A Little Help needed to walk in hospital room?: A Little Help needed climbing 3-5 steps with a railing? : A Little 6 Click Score: 20    End of Session   Activity Tolerance: Patient tolerated treatment well Patient left: in chair;with chair alarm set;with call bell/phone within reach;with family/visitor present Nurse  Communication: Mobility status PT Visit Diagnosis: Unsteadiness on feet (R26.81);Muscle weakness (generalized) (M62.81)    Time: 1610-9604 PT Time Calculation (min) (ACUTE ONLY): 12 min   Charges:   PT Evaluation $PT Eval Moderate Complexity: 1 Mod          Aleda Grana, PT, DPT 05/27/22, 1:08 PM   Sandi Mariscal 05/27/2022, 1:06 PM

## 2022-05-27 NOTE — Progress Notes (Signed)
PROGRESS NOTE    Tanee Henery  NKN:[id number] DOB: 1929-10-19 DOA: 05/25/2022 PCP: Dorothey Baseman, MD    Brief Narrative:  Briana Lozano is a 88 y.o. female with a history of HTN, intracranial aneurysm, and hypothyroidism who presented to the ED with progressive headache, nausea and vomiting. She also complained of some abdominal discomfort. CT abd/pelvis showed large hiatal hernia without acute pathology. CTA head revealed enlarging intracranial aneurysm thought to be the cause of patient's headache. Neurosurgery was consulted, though outpatient follow up is recommended.  Intractable nausea and vomiting is improving.  Patient tolerating p.o. diet as of 6/17 a.m.  Assessment & Plan:   Principal Problem:   Nausea & vomiting Active Problems:   Hypothyroidism   Nausea and vomiting   Hypertensive urgency  Intractable nausea and vomiting Improving over interval P.o. diet as tolerated Continue low rate IVF for now As needed antiemetics   Documented hypoxemia  No symptoms per pt. No lung findings on exam.  Chest x-ray clear.  Patient on room air   Enlarging right cavernous ICA aneurysm  Also with smaller PCOM aneurysm. D/w Dr. Myer Haff who confirms her headache is likely attributable to this. He discussed management options with the patient who would like to pursue outpatient follow up. He will discuss with [facility]. No emergent needs.  Hypothyroidism PTA Synthroid  Neuropathy PTA gabapentin   DVT prophylaxis: SCDs Code Status: Full Family Communication: None today Disposition Plan: Status is: Inpatient Remains inpatient appropriate because: Intractable nausea and vomiting.  Improving over interval.  Anticipated date of discharge 6/18.   Level of care: Progressive  Consultants:  Neurosurgery  Procedures:  None  Antimicrobials: None   Subjective: Seen and examined.  Sitting up in chair eating breakfast.  No visible distress.   No pain complaints.  Answers all questions appropriately.  Objective: Vitals:   05/27/22 0005 05/27/22 0432 05/27/22 0727 05/27/22 1058  BP: 133/73 (!) 177/86 (!) 154/76 120/67  Pulse: 69 63 61 75  Resp: 19 17 17 17   Temp: 97.9 F (36.6 C) 97.9 F (36.6 C) 97.7 F (36.5 C) (!) 97.5 F (36.4 C)  TempSrc: Oral Oral Oral Oral  SpO2: 91% 92% 92% 92%  Weight:      Height:        Intake/Output Summary (Last 24 hours) at 05/27/2022 1306 Last data filed at 05/27/2022 1058 Gross per 24 hour  Intake 1996.75 ml  Output 0 ml  Net 1996.75 ml   Filed Weights   05/25/22 2326  Weight: 54.4 kg    Examination:  General exam: No acute distress Respiratory system: Lungs clear.  Normal work of breathing.  Room air Cardiovascular system: S1-S2, RRR, no murmurs, 2/6 systolic murmur Gastrointestinal system: Soft, NT/ND, normal bowel sounds Central nervous system: Alert and oriented.  Right eye palsy.  Decreased facial sensation on right Extremities: Symmetric 5 x 5 power. Skin: No rashes, lesions or ulcers Psychiatry: Judgement and insight appear normal. Mood & affect appropriate.     Data Reviewed: I have personally reviewed following labs and imaging studies  CBC: Recent Labs  Lab 05/25/22 2329 05/26/22 0509  WBC 5.1 5.3  HGB 14.7 13.8  HCT 43.1 40.2  MCV 96.6 96.9  PLT 210 199   Basic Metabolic Panel: Recent Labs  Lab 05/25/22 2329 05/26/22 0509 05/27/22 0404  NA 133* 134* 138  K 3.2* 3.3* 4.3  CL 96* 98 105  CO2 28 27 29   GLUCOSE 139* 147* 91  BUN 9  9 13  CREATININE 0.58 0.53 0.72  CALCIUM 9.1 8.9 9.3  MG 2.1  --   --    GFR: Estimated Creatinine Clearance: 35.3 mL/min (by C-G formula based on SCr of 0.72 mg/dL). Liver Function Tests: Recent Labs  Lab 05/25/22 2329 05/26/22 0509  AST 21 20  ALT 10 10  ALKPHOS 53 48  BILITOT 1.1 0.9  PROT 7.2 7.0  ALBUMIN 3.9 3.6   Recent Labs  Lab 05/25/22 2329  LIPASE 25   No results for input(s): "AMMONIA" in  the last 168 hours. Coagulation Profile: No results for input(s): "INR", "PROTIME" in the last 168 hours. Cardiac Enzymes: No results for input(s): "CKTOTAL", "CKMB", "CKMBINDEX", "TROPONINI" in the last 168 hours. BNP (last 3 results) No results for input(s): "PROBNP" in the last 8760 hours. HbA1C: No results for input(s): "HGBA1C" in the last 72 hours. CBG: No results for input(s): "GLUCAP" in the last 168 hours. Lipid Profile: No results for input(s): "CHOL", "HDL", "LDLCALC", "TRIG", "CHOLHDL", "LDLDIRECT" in the last 72 hours. Thyroid Function Tests: No results for input(s): "TSH", "T4TOTAL", "FREET4", "T3FREE", "THYROIDAB" in the last 72 hours. Anemia Panel: No results for input(s): "VITAMINB12", "FOLATE", "FERRITIN", "TIBC", "IRON", "RETICCTPCT" in the last 72 hours. Sepsis Labs: No results for input(s): "PROCALCITON", "LATICACIDVEN" in the last 168 hours.  No results found for this or any previous visit (from the past 240 hour(s)).       Radiology Studies: DG Chest Port 1 View  Result Date: 05/26/2022 CLINICAL DATA:  Hypoxia, history of hypertension EXAM: PORTABLE CHEST 1 VIEW COMPARISON:  Chest radiograph 09/09/2021 FINDINGS: The heart size is stable. The upper mediastinum is prominent, increased since the study from 09/09/2021, though likely at least in part due to rightward patient rotation. There is no focal consolidation or pulmonary edema. There is no pleural effusion or pneumothorax. Retrocardiac opacity is consistent with a large hiatal hernia as seen on prior studies. There is no acute osseous abnormality. IMPRESSION: 1. Prominent upper mediastinum is most likely due to patient rotation. Consider repeat upright PA and lateral radiographs for better evaluation. 2. Otherwise, no radiographic evidence of acute cardiopulmonary process. Electronically Signed   By: Lesia Hausen M.D.   On: 05/26/2022 08:31   CT Angio Head W or Wo Contrast  Result Date: 05/26/2022 CLINICAL  DATA:  Initial evaluation for acute headache. History of aneurysm, untreated. EXAM: CT ANGIOGRAPHY HEAD TECHNIQUE: Multidetector CT imaging of the head was performed using the standard protocol during bolus administration of intravenous contrast. Multiplanar CT image reconstructions and MIPs were obtained to evaluate the vascular anatomy. RADIATION DOSE REDUCTION: This exam was performed according to the departmental dose-optimization program which includes automated exposure control, adjustment of the mA and/or kV according to patient size and/or use of iterative reconstruction technique. CONTRAST:  25mL OMNIPAQUE IOHEXOL 350 MG/ML SOLN COMPARISON:  Prior Study from 09/09/2021. FINDINGS: CT HEAD Brain: Cerebral volume within normal limits. Chronic microvascular ischemic disease noted involving the supratentorial cerebral white matter. No acute intracranial hemorrhage. No acute large vessel territory infarct. No mass lesion or midline shift. No hydrocephalus or extra-axial fluid collection. Vascular: Large right ICA aneurysm, measuring 2.5 x 3.0 cm, increased in size from prior CTA with this previously measured up to 2.8 cm. No other hyperdense vessel. Scattered calcified atherosclerosis present at skull base. Skull: Scalp soft tissues and calvarium within normal limits. Sinuses: Chronic right sphenoid sinusitis.  No mastoid effusion. Other: Globes and orbital soft tissues demonstrate no acute finding. Prior bilateral ocular  lens replacement. CTA HEAD Anterior circulation: Visualized distal cervical ICAs are irregular with multifocal irregularity and beading, which could be related FMD and/or chronic hypertension. Left ICA patent through the siphon without significant stenosis. Extremely large aneurysm arising from the cavernous right ICA again seen. The filling portion of the aneurysm sac is increased in size, now measuring 2.2 x 2.1 x 1.0 cm. Mass effect on the adjacent right lateral rectus with associated  esotropia. 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 A1 segments patent. Normal anterior communicating artery complex. Anterior cerebral arteries patent without abnormality. Right M1 widely patent. Left M 1 bifurcates at its origin. Both middle cerebral arteries patent and well perfused. Posterior circulation: Both V4 segments patent without significant stenosis. Both PICA patent. Basilar patent to its distal aspect without stenosis. Superior cerebellar arteries patent bilaterally. Left PCA supplied via the basilar. Fetal type origin right PCA. Both PCAs perfused to their distal aspects without stenosis. Venous sinuses: Not well assessed due to timing the contrast bolus. Anatomic variants: Fetal type origin of the right PCA. Review of the MIP images confirms the above findings. IMPRESSION: 1. 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 This is increased in size as compared to prior CTA from 09/09/2021. 2. 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 3. Scattered intracranial atherosclerotic disease without hemodynamically significant stenosis or other acute vascular abnormality. 4. No other acute intracranial abnormality. 5. Chronic right sphenoid sinusitis. Electronically Signed   By: Rise Mu M.D.   On: 05/26/2022 01:36   CT ABDOMEN PELVIS W CONTRAST  Result Date: 05/26/2022 CLINICAL DATA:  Nausea, vomiting. Abdominal pain, acute, nonlocalized EXAM: CT ABDOMEN AND PELVIS WITH CONTRAST TECHNIQUE: Multidetector CT imaging of the abdomen and pelvis was performed using the standard protocol following bolus administration of intravenous contrast. RADIATION DOSE REDUCTION: This exam was performed according to the departmental dose-optimization program which includes automated exposure control, adjustment of the mA and/or kV according  to patient size and/or use of iterative reconstruction technique. CONTRAST:  76mL OMNIPAQUE IOHEXOL 350 MG/ML SOLN COMPARISON:  08/30/2021 FINDINGS: Lower chest: Stable subpleural 5 mm pulmonary nodule within the left lower lobe, benign. Large hiatal hernia. Cardiac size within normal limits. Hepatobiliary: Cholelithiasis without pericholecystic inflammatory change. Liver unremarkable. No intra or extrahepatic biliary ductal dilation Pancreas: Unremarkable Spleen: Unremarkable Adrenals/Urinary Tract: Adrenal glands are unremarkable. Kidneys are normal, without renal calculi, focal lesion, or hydronephrosis. Bladder is unremarkable. Stomach/Bowel: The intrathoracic stomach, small bowel, and large bowel are unremarkable. No evidence of obstruction or focal inflammation. The appendix is not clearly identified and may be absent. No free intraperitoneal gas or fluid. Vascular/Lymphatic: Aortic atherosclerosis. No enlarged abdominal or pelvic lymph nodes. Reproductive: Uterus and bilateral adnexa are unremarkable. Other: No abdominal wall hernia Musculoskeletal: Degenerative changes seen within the lumbar spine. Bilateral L5 pars defects. No acute bone abnormality. No lytic or blastic bone lesion. IMPRESSION: 1. No acute intra-abdominal pathology identified. No definite radiographic explanation for the patient's reported symptoms. 2. Large hiatal hernia. 3. Cholelithiasis. Aortic Atherosclerosis (ICD10-I70.0). Electronically Signed   By: Helyn Numbers M.D.   On: 05/26/2022 00:51        Scheduled Meds:  bisoprolol  2.5 mg Oral Daily   citalopram  10 mg Oral Daily   gabapentin  300 mg Oral TID   levothyroxine  75 mcg Oral Q0600   pantoprazole (  PROTONIX) IV  40 mg Intravenous BID   Continuous Infusions:  lactated ringers 1,000 mL with potassium chloride 40 mEq infusion 50 mL/hr at 05/27/22 0959     LOS: 1 day       Tresa Moore, MD [facility]   If 7PM-7AM, please contact  night-coverage  05/27/2022, 1:06 PM

## 2022-05-27 NOTE — Evaluation (Signed)
Occupational Therapy Evaluation Patient Details Name: Briana Lozano MRN: [id number] DOB: 06/29/1929 Today's Date: 05/27/2022   History of Present Illness Briana Lozano is a 88 y.o. female with history of hypertension, hypothyroidism intracranial aneurysm has been experiencing frontal headache with nausea vomiting and double vision for the last 1 week.  Denies any fever chills. Recently was treated for UTI. Seen by neurosurgery for assessment of worsening double vision. Neurosurgery reccomending ongoing outpatient follow-up and no surgical intervention at this time.   Clinical Impression   Briana Lozano was seen for OT evaluation this date. Prior to hospital admission, pt was MOD I for ADL management. She endorses using a 4WW for functional mobility and being able to bathe and dress herself independently. Pt lives with her son in a 1 level apartment home with a level entry. Currently pt demonstrates impairments as described below (See OT problem list) which functionally limit her ability to perform ADL/self-care tasks. Pt currently requires MIN GUARD-SUPERVISION for bed/functional mobility as well as SET UP assist for UB ADL management. Anticipate MIN A for more exertional ADL management including LB bathing and dressing. Pt would benefit from skilled OT services to address noted impairments and functional limitations (see below for any additional details) in order to maximize safety and independence while minimizing falls risk and caregiver burden. Upon hospital discharge, recommend HHOT to maximize pt safety and return to functional independence during meaningful occupations of daily life.        Recommendations for follow up therapy are one component of a multi-disciplinary discharge planning process, led by the attending physician.  Recommendations may be updated based on patient status, additional functional criteria and insurance authorization.   Follow Up Recommendations  Home  health OT    Assistance Recommended at Discharge Set up Supervision/Assistance  Patient can return home with the following      Functional Status Assessment  Patient has had a recent decline in their functional status and demonstrates the ability to make significant improvements in function in a reasonable and predictable amount of time.  Equipment Recommendations  None recommended by OT    Recommendations for Other Services       Precautions / Restrictions Precautions Precautions: Fall Restrictions Weight Bearing Restrictions: No      Mobility Bed Mobility Overal bed mobility: Needs Assistance Bed Mobility: Supine to Sit     Supine to sit: HOB elevated, Supervision          Transfers Overall transfer level: Needs assistance Equipment used: 1 person hand held assist Transfers: Sit to/from Stand, Bed to chair/wheelchair/BSC Sit to Stand: Supervision     Step pivot transfers: Min guard            Balance Overall balance assessment: Needs assistance Sitting-balance support: Feet supported, No upper extremity supported Sitting balance-Leahy Scale: Good Sitting balance - Comments: steady static sittig, reaching within BOS   Standing balance support: During functional activity, Reliant on assistive device for balance Standing balance-Leahy Scale: Fair                             ADL either performed or assessed with clinical judgement   ADL Overall ADL's : Needs assistance/impaired                                       General ADL Comments: Pt is functionally  imited by generalized weakness, decreased vision, and decreased safety awareness. She requires SUPERVISOIN for functional mobility, SET UP for UB ADL management, and MIN A for LB ADL management from STS.     Vision Patient Visual Report: No change from baseline;Diplopia Additional Comments: R eye paralysis; baseline double vision.     Perception     Praxis       Pertinent Vitals/Pain Pain Assessment Pain Assessment: No/denies pain     Hand Dominance Right   Extremity/Trunk Assessment Upper Extremity Assessment Upper Extremity Assessment: Generalized weakness   Lower Extremity Assessment Lower Extremity Assessment: Generalized weakness       Communication Communication Communication: No difficulties   Cognition Arousal/Alertness: Awake/alert Behavior During Therapy: WFL for tasks assessed/performed Overall Cognitive Status: Within Functional Limits for tasks assessed                                 General Comments: Pleasant, conversational, oriented to self, place, and date.     General Comments       Exercises Other Exercises Other Exercises: Pt educated on role of OT in acute setting, safe use of AE/DME for ADL management, and DC recs.   Shoulder Instructions      Home Living Family/patient expects to be discharged to:: Private residence Living Arrangements: Children Available Help at Discharge: Family;Available PRN/intermittently Type of Home: Apartment Home Access: Stairs to enter Entrance Stairs-Number of Steps: 2 Entrance Stairs-Rails: Right;Left;Can reach both Home Layout: One level     Bathroom Shower/Tub: Chief Strategy Officer: Standard (riser with handles)     Home Equipment: Tub bench;Cane - quad;Rollator (4 wheels);Rolling Walker (2 wheels);Toilet riser;Hand held shower head          Prior Functioning/Environment Prior Level of Function : Independent/Modified Independent             Mobility Comments: Uses 4WW for mobility. ADLs Comments: Children assist with driving. MOD I for bathing dressing, etc.        OT Problem List: Decreased strength;Decreased coordination;Decreased activity tolerance;Decreased safety awareness;Impaired balance (sitting and/or standing);Decreased knowledge of use of DME or AE      OT Treatment/Interventions: Self-care/ADL  training;Therapeutic exercise;Therapeutic activities;DME and/or AE instruction;Patient/family education;Balance training;Energy conservation;Visual/perceptual remediation/compensation    OT Goals(Current goals can be found in the care plan section) Acute Rehab OT Goals Patient Stated Goal: To go home OT Goal Formulation: With patient Time For Goal Achievement: 06/10/22 Potential to Achieve Goals: Good ADL Goals Pt Will Perform Grooming: standing;with modified independence Pt Will Perform Lower Body Dressing: sit to/from stand;with adaptive equipment;with set-up;with supervision;with caregiver independent in assisting Pt Will Transfer to Toilet: bedside commode;with modified independence;ambulating Pt Will Perform Toileting - Clothing Manipulation and hygiene: with modified independence;sit to/from stand;with adaptive equipment  OT Frequency: Min 2X/week    Co-evaluation              AM-PAC OT "6 Clicks" Daily Activity     Outcome Measure Help from another person eating meals?: None Help from another person taking care of personal grooming?: None Help from another person toileting, which includes using toliet, bedpan, or urinal?: A Little Help from another person bathing (including washing, rinsing, drying)?: A Little Help from another person to put on and taking off regular upper body clothing?: None Help from another person to put on and taking off regular lower body clothing?: A Little 6 Click Score: 21   End of  Session Equipment Utilized During Treatment: Gait belt;Rolling walker (2 wheels)  Activity Tolerance: Patient tolerated treatment well Patient left: in chair;with call bell/phone within reach;with chair alarm set  OT Visit Diagnosis: Other abnormalities of gait and mobility (R26.89);Other symptoms and signs involving the nervous system (O97.353)                Time: 2992-4268 OT Time Calculation (min): 17 min Charges:  OT General Charges $OT Visit: 1 Visit OT  Evaluation $OT Eval Moderate Complexity: 1 Mod OT Treatments $Self Care/Home Management : 8-22 mins  Rockney Ghee, M.S., OTR/L Ascom: [phone] 05/27/22, 11:11 AM

## 2022-05-28 DIAGNOSIS — R112 Nausea with vomiting, unspecified: Secondary | ICD-10-CM | POA: Diagnosis not present

## 2022-05-28 MED ORDER — POLYVINYL ALCOHOL 1.4 % OP SOLN: 2.0000 [drp] | OPHTHALMIC | Status: DC | PRN

## 2022-05-28 NOTE — TOC Initial Note (Signed)
Transition of Care [facility]) - Initial/Assessment Note    Patient Details  Name: Briana Lozano MRN: [id number] Date of Birth: [birth year]  Transition of Care [facility]) CM/SW Contact:    Luvenia Redden, RN Phone [phone] 05/28/2022, 11:57 AM  Clinical Narrative:                 Plan d/c Monday: Spoke with son Nash Dimmer concerning [facility]. Pt lives in an apartment with son and he is the main caregiver. Pt has sufficient transportation and able to obtain her medications. Pt uses a RW for mobility and A/O  with her independence. Son updated on the accepting [facility] with no additional needs presented.  [facility]) declined [facility]) accepted  New Address presented to agency: [address], [place], [place] [address]  Expected Discharge Plan: Home w Home Health Services Barriers to Discharge: Barriers Resolved   Patient Goals and CMS Choice   CMS Medicare.gov Compare Post Acute Care list provided to:: Patient Choice offered to / list presented to : Patient  Expected Discharge Plan and Services Expected Discharge Plan: Home w Home Health Services   Discharge Planning Services: CM Consult   Living arrangements for the past 2 months: Apartment                           HH Arranged: PT HH Agency: [facility] Date [facility] Agency Contacted: 05/28/22 Time HH Agency Contacted: 0930 Representative spoke with at [facility] Agency: Denyse Amass  Prior Living Arrangements/Services Living arrangements for the past 2 months: Apartment Lives with:: Adult Children Patient language and need for interpreter reviewed:: Yes Do you feel safe going back to the place where you live?: Yes      Need for Family Participation in Patient Care: Yes (Comment) Care giver support system in place?: Yes (comment)   Criminal Activity/Legal Involvement Pertinent to Current Situation/Hospitalization: No - Comment as needed  Activities of Daily Living Home  Assistive Devices/Equipment: Walker (specify type) ADL Screening (condition at time of admission) Patient's cognitive ability adequate to safely complete daily activities?: Yes Is the patient deaf or have difficulty hearing?: No Does the patient have difficulty seeing, even when wearing glasses/contacts?: Yes Does the patient have difficulty concentrating, remembering, or making decisions?: No Patient able to express need for assistance with ADLs?: Yes Does the patient have difficulty dressing or bathing?: No Independently performs ADLs?: No Communication: Independent Dressing (OT): Independent Grooming: Independent Feeding: Independent Bathing: Needs assistance Is this a change from baseline?: Pre-admission baseline Toileting: Needs assistance Is this a change from baseline?: Change from baseline, expected to last <3 days In/Out Bed: Independent with device (comment) Walks in Home: Independent with device (comment) Does the patient have difficulty walking or climbing stairs?: Yes Weakness of Legs: Both Weakness of Arms/Hands: None  Permission Sought/Granted   Permission granted to share information with : Yes, Verbal Permission Granted              Emotional Assessment Appearance:: Appears older than stated age Attitude/Demeanor/Rapport: Engaged Affect (typically observed): Accepting Orientation: : Oriented to Self, Oriented to Place, Oriented to  Time, Oriented to Situation Alcohol / Substance Use: Not Applicable Psych Involvement: No (comment)  Admission diagnosis:  Nausea & vomiting [R11.2] Nausea and vomiting in adult [R11.2] Right internal carotid artery aneurysm [I67.1] Nausea and vomiting [R11.2] Hypertension, unspecified type [I10] Patient Active Problem List   Diagnosis Date Noted   Nausea & vomiting 05/26/2022  Hypertensive urgency 05/26/2022   Acute lower UTI    Dysuria    Nausea and vomiting    Stroke (HCC) 09/20/2021   TIA (transient ischemic attack)  09/19/2021   Constipation 09/19/2021   Orthostatic hypotension    Numbness and tingling of right side of face    Gastroesophageal reflux disease without esophagitis    Right facial pain 09/10/2021   Weakness    Acute metabolic encephalopathy    Hyponatremia 09/09/2021   Altered mental status, unspecified 09/09/2021   Brain aneurysm    COPD (chronic obstructive pulmonary disease) (HCC)    Depression    AMS (altered mental status)    Hiatal hernia    Hypokalemia    Abdominal pain 01/28/2019   Hypothyroidism 01/17/2018   Accelerated hypertension 01/17/2018   Intermediate coronary syndrome (HCC) 01/17/2018   Macular degeneration 01/17/2018   Senile osteoporosis 01/17/2018   Obstructive sleep apnea syndrome 11/10/2015   Bowel habit changes 06/16/2014   BRBPR (bright red blood per rectum) 06/16/2014   Anxiety 05/29/2014   PCP:  Dorothey Baseman, MD Pharmacy:   [facility] [place] - [address] [place] [address] Phone: [phone] Fax: [phone]     Social Determinants of Health (SDOH) Interventions    Readmission Risk Interventions     No data to display

## 2022-05-28 NOTE — Progress Notes (Signed)
Mobility Specialist - Progress Note    05/28/22 1300  Mobility  Activity Ambulated with assistance in hallway;Stood at bedside;Dangled on edge of bed  Level of Assistance Standby assist, set-up cues, supervision of patient - no hands on  Assistive Device Front wheel walker  Distance Ambulated (ft) 140 ft  Activity Response Tolerated well  $Mobility charge 1 Mobility    Pt supine upon arrival using RA. Completes bed mobility ModI and STS CGA. Ambulates in hallway CGA-Supervision voicing no complaints and returns to bed with needs in reach and bed alarm set.  Clarisa Schools Mobility Specialist 05/28/22, 1:42 PM

## 2022-05-28 NOTE — Progress Notes (Signed)
PROGRESS NOTE    Briana Lozano  ZOX:[id number] DOB: 1929/01/24 DOA: 05/25/2022 PCP: Dorothey Baseman, MD    Brief Narrative:  Briana Lozano is a 88 y.o. female with a history of HTN, intracranial aneurysm, and hypothyroidism who presented to the ED with progressive headache, nausea and vomiting. She also complained of some abdominal discomfort. CT abd/pelvis showed large hiatal hernia without acute pathology. CTA head revealed enlarging intracranial aneurysm thought to be the cause of patient's headache. Neurosurgery was consulted, though outpatient follow up is recommended.  Intractable nausea and vomiting is improving.  Still having some persistent nausea and vomiting as of 6/18  Assessment & Plan:   Principal Problem:   Nausea & vomiting Active Problems:   Hypothyroidism   Nausea and vomiting   Hypertensive urgency  Intractable nausea and vomiting Improving over interval Not yet at goal Plan: Soft/bland diet as tolerated As needed antiemetics Okay to DC IVF Likely discharge 6/19   Documented hypoxemia  No symptoms per pt. No lung findings on exam.  Chest x-ray clear.  Patient on room air   Enlarging right cavernous ICA aneurysm  Also with smaller PCOM aneurysm. D/w Dr. Myer Haff who confirms her headache is likely attributable to this. He discussed management options with the patient who would like to pursue outpatient follow up. He will discuss with [facility]. No emergent needs.  Hypothyroidism PTA Synthroid  Neuropathy PTA gabapentin   DVT prophylaxis: SCDs Code Status: Full Family Communication: Son via phone 6/18 Disposition Plan: Status is: Inpatient Remains inpatient appropriate because: Intractable nausea and vomiting.  Improving over interval.  Anticipated date of discharge 6/19   Level of care: Progressive  Consultants:  Neurosurgery  Procedures:  None  Antimicrobials: None   Subjective: Seen and examined.   Sitting up in chair eating breakfast.  No visible distress.  No pain complaints.  Answers all questions appropriately.  Objective: Vitals:   05/27/22 2041 05/27/22 2328 05/28/22 0422 05/28/22 0802  BP: (!) 156/73 (!) 146/75 137/68 (!) 174/91  Pulse: 64 71 62 66  Resp: 20 18 16  (!) 25  Temp: 97.8 F (36.6 C) 98.4 F (36.9 C) 98.2 F (36.8 C) (!) 97.5 F (36.4 C)  TempSrc: Oral Oral Oral Oral  SpO2: 91% 93% 92% 91%  Weight:      Height:        Intake/Output Summary (Last 24 hours) at 05/28/2022 1037 Last data filed at 05/28/2022 1009 Gross per 24 hour  Intake 1838.31 ml  Output 0 ml  Net 1838.31 ml   Filed Weights   05/25/22 2326  Weight: 54.4 kg    Examination:  General exam: No acute distress Respiratory system: Lungs clear.  Normal work of breathing.  Room air Cardiovascular system: S1-S2, RRR, no murmurs, 2/6 systolic murmur Gastrointestinal system: Soft, NT/ND, normal bowel sounds Central nervous system: Alert and oriented.  Right eye palsy.  Decreased facial sensation on right Extremities: Symmetric 5 x 5 power. Skin: No rashes, lesions or ulcers Psychiatry: Judgement and insight appear normal. Mood & affect appropriate.     Data Reviewed: I have personally reviewed following labs and imaging studies  CBC: Recent Labs  Lab 05/25/22 2329 05/26/22 0509  WBC 5.1 5.3  HGB 14.7 13.8  HCT 43.1 40.2  MCV 96.6 96.9  PLT 210 199   Basic Metabolic Panel: Recent Labs  Lab 05/25/22 2329 05/26/22 0509 05/27/22 0404  NA 133* 134* 138  K 3.2* 3.3* 4.3  CL 96* 98 105  CO2 28 27 29   GLUCOSE 139* 147* 91  BUN 9 9 13   CREATININE 0.58 0.53 0.72  CALCIUM 9.1 8.9 9.3  MG 2.1  --   --    GFR: Estimated Creatinine Clearance: 35.3 mL/min (by C-G formula based on SCr of 0.72 mg/dL). Liver Function Tests: Recent Labs  Lab 05/25/22 2329 05/26/22 0509  AST 21 20  ALT 10 10  ALKPHOS 53 48  BILITOT 1.1 0.9  PROT 7.2 7.0  ALBUMIN 3.9 3.6   Recent Labs  Lab  05/25/22 2329  LIPASE 25   No results for input(s): "AMMONIA" in the last 168 hours. Coagulation Profile: No results for input(s): "INR", "PROTIME" in the last 168 hours. Cardiac Enzymes: No results for input(s): "CKTOTAL", "CKMB", "CKMBINDEX", "TROPONINI" in the last 168 hours. BNP (last 3 results) No results for input(s): "PROBNP" in the last 8760 hours. HbA1C: No results for input(s): "HGBA1C" in the last 72 hours. CBG: No results for input(s): "GLUCAP" in the last 168 hours. Lipid Profile: No results for input(s): "CHOL", "HDL", "LDLCALC", "TRIG", "CHOLHDL", "LDLDIRECT" in the last 72 hours. Thyroid Function Tests: No results for input(s): "TSH", "T4TOTAL", "FREET4", "T3FREE", "THYROIDAB" in the last 72 hours. Anemia Panel: No results for input(s): "VITAMINB12", "FOLATE", "FERRITIN", "TIBC", "IRON", "RETICCTPCT" in the last 72 hours. Sepsis Labs: No results for input(s): "PROCALCITON", "LATICACIDVEN" in the last 168 hours.  No results found for this or any previous visit (from the past 240 hour(s)).       Radiology Studies: No results found.      Scheduled Meds:  bisoprolol  2.5 mg Oral Daily   citalopram  10 mg Oral Daily   gabapentin  300 mg Oral TID   levothyroxine  75 mcg Oral Q0600   pantoprazole (PROTONIX) IV  40 mg Intravenous BID   Continuous Infusions:     LOS: 2 days       05/28/22, MD [facility]   If 7PM-7AM, please contact night-coverage  05/28/2022, 10:37 AM

## 2022-05-28 NOTE — Progress Notes (Signed)
Physical Therapy Treatment Patient Details Name: Briana Lozano MRN: [id number] DOB: 1929-04-07 Today's Date: 05/28/2022   History of Present Illness Pt is a 88 y/o F admitted on 05/25/22 after presenting to the ED with c/o progressive HA, N&V & abdominal discomfort. CT abd/pelvis showed large hiatal hernia without acute pathology. CTA head revealed enlarging intracranial aneurysm thought to be the cause of patient's headache. Neurosurgery was consulted, though outpatient follow up is recommended. PMH: HTN, intracranial aneurysm, hypothyroidism, COPD, COVID 19, depression, dyspnea, HTN, osteoporosis, PNA, sleep apnea, vertigo    PT Comments    Pt stated she recently walked but ready to go again.  OOB and completed x 1 lap around larger nursing and returns to room with min guard/supervision.  Overall progressing well and stated she anticipates discharge home tomorrow.   Recommendations for follow up therapy are one component of a multi-disciplinary discharge planning process, led by the attending physician.  Recommendations may be updated based on patient status, additional functional criteria and insurance authorization.  Follow Up Recommendations  Home health PT     Assistance Recommended at Discharge    Patient can return home with the following A little help with walking and/or transfers;A little help with bathing/dressing/bathroom;Assistance with cooking/housework;Direct supervision/assist for medications management   Equipment Recommendations  None recommended by PT    Recommendations for Other Services       Precautions / Restrictions Precautions Precautions: Fall Restrictions Weight Bearing Restrictions: No     Mobility  Bed Mobility Overal bed mobility: Modified Independent                  Transfers Overall transfer level: Needs assistance Equipment used: Rolling walker (2 wheels) Transfers: Sit to/from Stand Sit to Stand: Min guard                 Ambulation/Gait Ambulation/Gait assistance: Min guard Gait Distance (Feet): 200 Feet Assistive device: Rolling walker (2 wheels) Gait Pattern/deviations: Step-through pattern, Decreased step length - right, Decreased step length - left Gait velocity: slightly decreased         Stairs             Wheelchair Mobility    Modified Rankin (Stroke Patients Only)       Balance Overall balance assessment: Needs assistance Sitting-balance support: Feet supported, No upper extremity supported Sitting balance-Leahy Scale: Good     Standing balance support: Bilateral upper extremity supported, During functional activity Standing balance-Leahy Scale: Fair                              Cognition Arousal/Alertness: Awake/alert Behavior During Therapy: WFL for tasks assessed/performed Overall Cognitive Status: Within Functional Limits for tasks assessed                                          Exercises      General Comments        Pertinent Vitals/Pain Pain Assessment Pain Assessment: No/denies pain    Home Living                          Prior Function            PT Goals (current goals can now be found in the care plan section) Progress towards PT goals: Progressing toward goals  Frequency    Min 2X/week      PT Plan Current plan remains appropriate    Co-evaluation              AM-PAC PT "6 Clicks" Mobility   Outcome Measure  Help needed turning from your back to your side while in a flat bed without using bedrails?: None Help needed moving from lying on your back to sitting on the side of a flat bed without using bedrails?: None Help needed moving to and from a bed to a chair (including a wheelchair)?: A Little Help needed standing up from a chair using your arms (e.g., wheelchair or bedside chair)?: A Little Help needed to walk in hospital room?: A Little Help needed climbing 3-5 steps with  a railing? : A Little 6 Click Score: 20    End of Session Equipment Utilized During Treatment: Gait belt Activity Tolerance: Patient tolerated treatment well Patient left: in bed;with bed alarm set;with call bell/phone within reach Nurse Communication: Mobility status PT Visit Diagnosis: Unsteadiness on feet (R26.81);Muscle weakness (generalized) (M62.81)     Time: 3567-0141 PT Time Calculation (min) (ACUTE ONLY): 10 min  Charges:  $Gait Training: 8-22 mins                   Danielle Dess, PTA 05/28/22, 2:36 PM

## 2022-05-29 MED ORDER — ONDANSETRON HCL 4 MG PO TABS: 4.0000 mg | ORAL_TABLET | Freq: Four times a day (QID) | ORAL | 0 refills | Status: DC | PRN

## 2022-05-29 MED ORDER — ORAL CARE MOUTH RINSE
15.0000 mL | OROMUCOSAL | Status: DC | PRN
Start: 2022-05-29 — End: 2022-05-29

## 2022-05-29 NOTE — TOC Transition Note (Signed)
Transition of Care [facility]) - CM/SW Discharge Note   Patient Details  Name: Briana Lozano MRN: [id number] Date of Birth: Jan 25, 1929  Transition of Care [facility]) CM/SW Contact:  Durwin Glaze, LCSW Phone Number: 05/29/2022, 10:07 AM   Clinical Narrative:    Patient has orders to discharge home with [facility]. [facility] accepted referral yesterday per notes. No further concerns. CSW signing off.    Final next level of care: Home w Home Health Services Barriers to Discharge: Barriers Resolved   Patient Goals and CMS Choice   CMS Medicare.gov Compare Post Acute Care list provided to:: Patient Choice offered to / list presented to : Patient  Discharge Placement                    Patient and family notified of of transfer: 05/29/22  Discharge Plan and Services   Discharge Planning Services: CM Consult                      HH Arranged: PT [facility] Agency: [facility] Date [facility] Agency Contacted: 05/28/22 Time HH Agency Contacted: 0930 Representative spoke with at [facility] Agency: Denyse Amass  Social Determinants of Health (SDOH) Interventions     Readmission Risk Interventions     No data to display

## 2022-05-29 NOTE — Progress Notes (Deleted)
Physician Discharge Summary  Briana Lozano [id number] DOB: 1929-04-24 DOA: 05/25/2022  PCP: Juluis Pitch, MD  Admit date: 05/25/2022 Discharge date: 05/29/2022  Admitted From: Home Disposition:  Home with home health  Recommendations for Outpatient Follow-up:  Follow up with PCP in 1-2 weeks Follow-up with [facility]  Home Health:Yes Equipment/Devices:None   Discharge Condition:Stable  CODE STATUS:FULL Diet recommendation: Regular  Brief/Interim Summary:. Briana Lozano is a 88 y.o. female with a history of HTN, intracranial aneurysm, and hypothyroidism who presented to the ED with progressive headache, nausea and vomiting. She also complained of some abdominal discomfort. CT abd/pelvis showed large hiatal hernia without acute pathology. CTA head revealed enlarging intracranial aneurysm thought to be the cause of patient's headache. Neurosurgery was consulted, though outpatient follow up is recommended.  Intractable nausea and vomiting is improving.  Still having some persistent nausea and vomiting as of 6/18  Symptoms of nausea and vomiting have resolved on 6/19.  Patient stable for discharge at this time.  Patient will follow-up with [facility] postdischarge for further evaluation and management of enlarging intracerebral aneurysm.    Discharge Diagnoses:  Principal Problem:   Nausea & vomiting Active Problems:   Hypothyroidism   Nausea and vomiting   Hypertensive urgency  Intractable nausea and vomiting Improving over interval Tolerating p.o. diet at time of discharge Plan: Okay for discharge home with home health services.  Recommend sticking with soft bland diet.  As needed antiemetics prescribed   Documented hypoxemia  No symptoms per pt. No lung findings on exam.  Chest x-ray clear.  Patient on room air   Enlarging right cavernous ICA aneurysm  Also with smaller PCOM aneurysm. D/w Dr. Izora Ribas who confirms her  headache is likely attributable to this. He discussed management options with the patient who would like to pursue outpatient follow up. He will discuss with [facility]. No emergent needs.   Hypothyroidism PTA Synthroid   Neuropathy PTA gabapentin  Discharge Instructions  Discharge Instructions     Diet - low sodium heart healthy   Complete by: As directed    Increase activity slowly   Complete by: As directed       Allergies as of 05/29/2022       Reactions   Cortisone Swelling       Penicillins Swelling   Did it involve swelling of the face/tongue/throat, SOB, or low BP? No Did it involve sudden or severe rash/hives, skin peeling, or any reaction on the inside of your mouth or nose? No Did you need to seek medical attention at a hospital or doctor's office? No When did it last happen?      7-8 years If all above answers are "NO", may proceed with cephalosporin use.   Valium [diazepam] Other (See Comments)   Couldn't walk        Medication List     STOP taking these medications    multivitamin with minerals Tabs tablet   nitrofurantoin (macrocrystal-monohydrate) 100 MG capsule Commonly known as: MACROBID   Phazyme Maximum Strength 250 MG Caps Generic drug: Simethicone   PRESERVISION AREDS 2 PO       TAKE these medications    acetaminophen 650 MG CR tablet Commonly known as: TYLENOL Take 1,000 mg by mouth every 8 (eight) hours as needed for pain.   bisacodyl 10 MG suppository Commonly known as: DULCOLAX Place 1 suppository (10 mg total) rectally daily as needed for moderate constipation.   bisoprolol 5 MG tablet Commonly  known as: ZEBETA Take 0.5 tablets by mouth daily.   citalopram 10 MG tablet Commonly known as: CELEXA Take 1 tablet (10 mg total) by mouth daily.   cyanocobalamin 1000 MCG/ML injection Commonly known as: (VITAMIN B-12) Inject 1,000 mcg into the muscle every 30 (thirty) days.   feeding supplement Liqd Take 237  mLs by mouth 2 (two) times daily between meals.   gabapentin 300 MG capsule Commonly known as: NEURONTIN Take 1 capsule (300 mg total) by mouth 3 (three) times daily.   ibuprofen 400 MG tablet Commonly known as: ADVIL Take 2 tablets (800 mg total) by mouth every 8 (eight) hours as needed for moderate pain (please try tyelenol first). What changed:  how much to take when to take this   levothyroxine 75 MCG tablet Commonly known as: SYNTHROID Take 75 mcg by mouth daily before breakfast.   midodrine 2.5 MG tablet Commonly known as: PROAMATINE Take 1 tablet (2.5 mg total) by mouth daily as needed (orthostatic hypotension, IF SBP drops below 100 with standing can give 1 hour before working with PT).   ondansetron 4 MG disintegrating tablet Commonly known as: Zofran ODT Take 1 tablet (4 mg total) by mouth every 8 (eight) hours as needed for nausea or vomiting.   ondansetron 4 MG tablet Commonly known as: ZOFRAN Take 1 tablet (4 mg total) by mouth every 6 (six) hours as needed for nausea.   pantoprazole 40 MG tablet Commonly known as: PROTONIX Take 1 tablet (40 mg total) by mouth at bedtime.   polyethylene glycol 17 g packet Commonly known as: MIRALAX / GLYCOLAX Take 17 g by mouth daily as needed for mild constipation.   senna 8.6 MG Tabs tablet Commonly known as: SENOKOT Take 1 tablet by mouth at bedtime.   SYSTANE OP Place 1 drop into both eyes daily as needed (dry eyes).   ZyrTEC Allergy 10 MG tablet Generic drug: cetirizine Take 10 mg by mouth daily.        Follow-up Information     Schedule an appointment as soon as possible for a visit  with Dorothey Baseman, MD.   Specialty: Family Medicine Contact information: [address] [place] [address] [phone]                Allergies  Allergen Reactions   Cortisone Swelling        Penicillins Swelling    Did it involve swelling of the face/tongue/throat, SOB, or low BP? No Did it involve  sudden or severe rash/hives, skin peeling, or any reaction on the inside of your mouth or nose? No Did you need to seek medical attention at a hospital or doctor's office? No When did it last happen?      7-8 years If all above answers are "NO", may proceed with cephalosporin use.    Valium [Diazepam] Other (See Comments)    Couldn't walk    Consultations: Neurosurgery   Procedures/Studies: DG Chest Port 1 View  Result Date: 05/26/2022 CLINICAL DATA:  Hypoxia, history of hypertension EXAM: PORTABLE CHEST 1 VIEW COMPARISON:  Chest radiograph 09/09/2021 FINDINGS: The heart size is stable. The upper mediastinum is prominent, increased since the study from 09/09/2021, though likely at least in part due to rightward patient rotation. There is no focal consolidation or pulmonary edema. There is no pleural effusion or pneumothorax. Retrocardiac opacity is consistent with a large hiatal hernia as seen on prior studies. There is no acute osseous abnormality. IMPRESSION: 1. Prominent upper mediastinum is  most likely due to patient rotation. Consider repeat upright PA and lateral radiographs for better evaluation. 2. Otherwise, no radiographic evidence of acute cardiopulmonary process. Electronically Signed   By: Lesia Hausen M.D.   On: 05/26/2022 08:31   CT Angio Head W or Wo Contrast  Result Date: 05/26/2022 CLINICAL DATA:  Initial evaluation for acute headache. History of aneurysm, untreated. EXAM: CT ANGIOGRAPHY HEAD TECHNIQUE: Multidetector CT imaging of the head was performed using the standard protocol during bolus administration of intravenous contrast. Multiplanar CT image reconstructions and MIPs were obtained to evaluate the vascular anatomy. RADIATION DOSE REDUCTION: This exam was performed according to the departmental dose-optimization program which includes automated exposure control, adjustment of the mA and/or kV according to patient size and/or use of iterative reconstruction technique.  CONTRAST:  39mL OMNIPAQUE IOHEXOL 350 MG/ML SOLN COMPARISON:  Prior Study from 09/09/2021. FINDINGS: CT HEAD Brain: Cerebral volume within normal limits. Chronic microvascular ischemic disease noted involving the supratentorial cerebral white matter. No acute intracranial hemorrhage. No acute large vessel territory infarct. No mass lesion or midline shift. No hydrocephalus or extra-axial fluid collection. Vascular: Large right ICA aneurysm, measuring 2.5 x 3.0 cm, increased in size from prior CTA with this previously measured up to 2.8 cm. No other hyperdense vessel. Scattered calcified atherosclerosis present at skull base. Skull: Scalp soft tissues and calvarium within normal limits. Sinuses: Chronic right sphenoid sinusitis.  No mastoid effusion. Other: Globes and orbital soft tissues demonstrate no acute finding. Prior bilateral ocular lens replacement. CTA HEAD Anterior circulation: Visualized distal cervical ICAs are irregular with multifocal irregularity and beading, which could be related FMD and/or chronic hypertension. Left ICA patent through the siphon without significant stenosis. Extremely large aneurysm arising from the cavernous right ICA again seen. The filling portion of the aneurysm sac is increased in size, now measuring 2.2 x 2.1 x 1.0 cm. Mass effect on the adjacent right lateral rectus with associated esotropia. 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 A1 segments patent. Normal anterior communicating artery complex. Anterior cerebral arteries patent without abnormality. Right M1 widely patent. Left M 1 bifurcates at its origin. Both middle cerebral arteries patent and well perfused. Posterior circulation: Both V4 segments patent without significant stenosis. Both PICA patent. Basilar patent to its distal aspect without stenosis. Superior cerebellar arteries patent bilaterally. Left PCA supplied via the basilar. Fetal type origin right PCA.  Both PCAs perfused to their distal aspects without stenosis. Venous sinuses: Not well assessed due to timing the contrast bolus. Anatomic variants: Fetal type origin of the right PCA. Review of the MIP images confirms the above findings. IMPRESSION: 1. 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 This is increased in size as compared to prior CTA from 09/09/2021. 2. 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 3. Scattered intracranial atherosclerotic disease without hemodynamically significant stenosis or other acute vascular abnormality. 4. No other acute intracranial abnormality. 5. Chronic right sphenoid sinusitis. Electronically Signed   By: Rise Mu M.D.   On: 05/26/2022 01:36   CT ABDOMEN PELVIS W CONTRAST  Result Date: 05/26/2022 CLINICAL DATA:  Nausea, vomiting. Abdominal pain, acute, nonlocalized EXAM: CT ABDOMEN AND PELVIS WITH CONTRAST TECHNIQUE: Multidetector CT imaging of the abdomen and pelvis was performed using the standard protocol following bolus administration of intravenous contrast. RADIATION DOSE REDUCTION: This exam was performed according to the  departmental dose-optimization program which includes automated exposure control, adjustment of the mA and/or kV according to patient size and/or use of iterative reconstruction technique. CONTRAST:  11mL OMNIPAQUE IOHEXOL 350 MG/ML SOLN COMPARISON:  08/30/2021 FINDINGS: Lower chest: Stable subpleural 5 mm pulmonary nodule within the left lower lobe, benign. Large hiatal hernia. Cardiac size within normal limits. Hepatobiliary: Cholelithiasis without pericholecystic inflammatory change. Liver unremarkable. No intra or extrahepatic biliary ductal dilation Pancreas: Unremarkable Spleen: Unremarkable Adrenals/Urinary Tract: Adrenal glands are unremarkable. Kidneys are normal, without renal calculi, focal lesion, or  hydronephrosis. Bladder is unremarkable. Stomach/Bowel: The intrathoracic stomach, small bowel, and large bowel are unremarkable. No evidence of obstruction or focal inflammation. The appendix is not clearly identified and may be absent. No free intraperitoneal gas or fluid. Vascular/Lymphatic: Aortic atherosclerosis. No enlarged abdominal or pelvic lymph nodes. Reproductive: Uterus and bilateral adnexa are unremarkable. Other: No abdominal wall hernia Musculoskeletal: Degenerative changes seen within the lumbar spine. Bilateral L5 pars defects. No acute bone abnormality. No lytic or blastic bone lesion. IMPRESSION: 1. No acute intra-abdominal pathology identified. No definite radiographic explanation for the patient's reported symptoms. 2. Large hiatal hernia. 3. Cholelithiasis. Aortic Atherosclerosis (ICD10-I70.0). Electronically Signed   By: Helyn Numbers M.D.   On: 05/26/2022 00:51      Subjective: Seen and examined the day of discharge.  Stable no distress.  Tolerating p.o. intake.  No pain complaints.  Stable for discharge home.  Discharge Exam: Vitals:   05/29/22 0604 05/29/22 0720  BP: (!) 176/71 (!) 153/75  Pulse: 60 (!) 59  Resp:    Temp:  98.1 F (36.7 C)  SpO2: 92% 92%   Vitals:   05/29/22 0315 05/29/22 0446 05/29/22 0604 05/29/22 0720  BP: (!) 153/64 (!) 154/67 (!) 176/71 (!) 153/75  Pulse: (!) 59 60 60 (!) 59  Resp: 20 20    Temp: 97.9 F (36.6 C) 98.3 F (36.8 C)  98.1 F (36.7 C)  TempSrc: Tympanic   Oral  SpO2: 95% 90% 92% 92%  Weight:      Height:        General: Pt is alert, awake, not in acute distress Cardiovascular: RRR, S1/S2 +, no rubs, no gallops Respiratory: CTA bilaterally, no wheezing, no rhonchi Abdominal: Soft, NT, ND, bowel sounds + Extremities: no edema, no cyanosis    The results of significant diagnostics from this hospitalization (including imaging, microbiology, ancillary and laboratory) are listed below for reference.      Microbiology: No results found for this or any previous visit (from the past 240 hour(s)).   Labs: BNP (last 3 results) No results for input(s): "BNP" in the last 8760 hours. Basic Metabolic Panel: Recent Labs  Lab 05/25/22 2329 05/26/22 0509 05/27/22 0404  NA 133* 134* 138  K 3.2* 3.3* 4.3  CL 96* 98 105  CO2 28 27 29   GLUCOSE 139* 147* 91  BUN 9 9 13   CREATININE 0.58 0.53 0.72  CALCIUM 9.1 8.9 9.3  MG 2.1  --   --    Liver Function Tests: Recent Labs  Lab 05/25/22 2329 05/26/22 0509  AST 21 20  ALT 10 10  ALKPHOS 53 48  BILITOT 1.1 0.9  PROT 7.2 7.0  ALBUMIN 3.9 3.6   Recent Labs  Lab 05/25/22 2329  LIPASE 25   No results for input(s): "AMMONIA" in the last 168 hours. CBC: Recent Labs  Lab 05/25/22 2329 05/26/22 0509  WBC 5.1 5.3  HGB 14.7 13.8  HCT 43.1 40.2  MCV 96.6  96.9  PLT 210 199   Cardiac Enzymes: No results for input(s): "CKTOTAL", "CKMB", "CKMBINDEX", "TROPONINI" in the last 168 hours. BNP: Invalid input(s): "POCBNP" CBG: No results for input(s): "GLUCAP" in the last 168 hours. D-Dimer No results for input(s): "DDIMER" in the last 72 hours. Hgb A1c No results for input(s): "HGBA1C" in the last 72 hours. Lipid Profile No results for input(s): "CHOL", "HDL", "LDLCALC", "TRIG", "CHOLHDL", "LDLDIRECT" in the last 72 hours. Thyroid function studies No results for input(s): "TSH", "T4TOTAL", "T3FREE", "THYROIDAB" in the last 72 hours.  Invalid input(s): "FREET3" Anemia work up No results for input(s): "VITAMINB12", "FOLATE", "FERRITIN", "TIBC", "IRON", "RETICCTPCT" in the last 72 hours. Urinalysis    Component Value Date/Time   COLORURINE YELLOW (A) 05/25/2022 2330   APPEARANCEUR CLEAR (A) 05/25/2022 2330   APPEARANCEUR CLEAR 10/07/2014 0958   LABSPEC >1.046 (H) 05/25/2022 2330   LABSPEC 1.015 10/07/2014 0958   PHURINE 7.0 05/25/2022 2330   GLUCOSEU NEGATIVE 05/25/2022 2330   GLUCOSEU NEGATIVE 10/07/2014 0958   HGBUR NEGATIVE  05/25/2022 2330   BILIRUBINUR NEGATIVE 05/25/2022 2330   BILIRUBINUR NEGATIVE 10/07/2014 0958   KETONESUR 5 (A) 05/25/2022 2330   PROTEINUR NEGATIVE 05/25/2022 2330   NITRITE NEGATIVE 05/25/2022 2330   LEUKOCYTESUR NEGATIVE 05/25/2022 2330   LEUKOCYTESUR NEGATIVE 10/07/2014 0958   Sepsis Labs Recent Labs  Lab 05/25/22 2329 05/26/22 0509  WBC 5.1 5.3   Microbiology No results found for this or any previous visit (from the past 240 hour(s)).   Time coordinating discharge: Over 30 minutes  SIGNED:   Tresa Moore, MD  [facility] 05/29/2022, 10:50 AM Pager   If 7PM-7AM, please contact night-coverage

## 2022-05-29 NOTE — Discharge Summary (Signed)
Physician Discharge Summary  Briana Lozano [id number] DOB: 1929-04-24 DOA: 05/25/2022  PCP: Juluis Pitch, MD  Admit date: 05/25/2022 Discharge date: 05/29/2022  Admitted From: Home Disposition:  Home with home health  Recommendations for Outpatient Follow-up:  Follow up with PCP in 1-2 weeks Follow-up with [facility]  Home Health:Yes Equipment/Devices:None   Discharge Condition:Stable  CODE STATUS:FULL Diet recommendation: Regular  Brief/Interim Summary:. Briana Lozano is a 88 y.o. female with a history of HTN, intracranial aneurysm, and hypothyroidism who presented to the ED with progressive headache, nausea and vomiting. She also complained of some abdominal discomfort. CT abd/pelvis showed large hiatal hernia without acute pathology. CTA head revealed enlarging intracranial aneurysm thought to be the cause of patient's headache. Neurosurgery was consulted, though outpatient follow up is recommended.  Intractable nausea and vomiting is improving.  Still having some persistent nausea and vomiting as of 6/18  Symptoms of nausea and vomiting have resolved on 6/19.  Patient stable for discharge at this time.  Patient will follow-up with [facility] postdischarge for further evaluation and management of enlarging intracerebral aneurysm.    Discharge Diagnoses:  Principal Problem:   Nausea & vomiting Active Problems:   Hypothyroidism   Nausea and vomiting   Hypertensive urgency  Intractable nausea and vomiting Improving over interval Tolerating p.o. diet at time of discharge Plan: Okay for discharge home with home health services.  Recommend sticking with soft bland diet.  As needed antiemetics prescribed   Documented hypoxemia  No symptoms per pt. No lung findings on exam.  Chest x-ray clear.  Patient on room air   Enlarging right cavernous ICA aneurysm  Also with smaller PCOM aneurysm. D/w Dr. Izora Ribas who confirms her  headache is likely attributable to this. He discussed management options with the patient who would like to pursue outpatient follow up. He will discuss with [facility]. No emergent needs.   Hypothyroidism PTA Synthroid   Neuropathy PTA gabapentin  Discharge Instructions  Discharge Instructions     Diet - low sodium heart healthy   Complete by: As directed    Increase activity slowly   Complete by: As directed       Allergies as of 05/29/2022       Reactions   Cortisone Swelling       Penicillins Swelling   Did it involve swelling of the face/tongue/throat, SOB, or low BP? No Did it involve sudden or severe rash/hives, skin peeling, or any reaction on the inside of your mouth or nose? No Did you need to seek medical attention at a hospital or doctor's office? No When did it last happen?      7-8 years If all above answers are "NO", may proceed with cephalosporin use.   Valium [diazepam] Other (See Comments)   Couldn't walk        Medication List     STOP taking these medications    multivitamin with minerals Tabs tablet   nitrofurantoin (macrocrystal-monohydrate) 100 MG capsule Commonly known as: MACROBID   Phazyme Maximum Strength 250 MG Caps Generic drug: Simethicone   PRESERVISION AREDS 2 PO       TAKE these medications    acetaminophen 650 MG CR tablet Commonly known as: TYLENOL Take 1,000 mg by mouth every 8 (eight) hours as needed for pain.   bisacodyl 10 MG suppository Commonly known as: DULCOLAX Place 1 suppository (10 mg total) rectally daily as needed for moderate constipation.   bisoprolol 5 MG tablet Commonly  known as: ZEBETA Take 0.5 tablets by mouth daily.   citalopram 10 MG tablet Commonly known as: CELEXA Take 1 tablet (10 mg total) by mouth daily.   cyanocobalamin 1000 MCG/ML injection Commonly known as: (VITAMIN B-12) Inject 1,000 mcg into the muscle every 30 (thirty) days.   feeding supplement Liqd Take 237  mLs by mouth 2 (two) times daily between meals.   gabapentin 300 MG capsule Commonly known as: NEURONTIN Take 1 capsule (300 mg total) by mouth 3 (three) times daily.   ibuprofen 400 MG tablet Commonly known as: ADVIL Take 2 tablets (800 mg total) by mouth every 8 (eight) hours as needed for moderate pain (please try tyelenol first). What changed:  how much to take when to take this   levothyroxine 75 MCG tablet Commonly known as: SYNTHROID Take 75 mcg by mouth daily before breakfast.   midodrine 2.5 MG tablet Commonly known as: PROAMATINE Take 1 tablet (2.5 mg total) by mouth daily as needed (orthostatic hypotension, IF SBP drops below 100 with standing can give 1 hour before working with PT).   ondansetron 4 MG disintegrating tablet Commonly known as: Zofran ODT Take 1 tablet (4 mg total) by mouth every 8 (eight) hours as needed for nausea or vomiting.   ondansetron 4 MG tablet Commonly known as: ZOFRAN Take 1 tablet (4 mg total) by mouth every 6 (six) hours as needed for nausea.   pantoprazole 40 MG tablet Commonly known as: PROTONIX Take 1 tablet (40 mg total) by mouth at bedtime.   polyethylene glycol 17 g packet Commonly known as: MIRALAX / GLYCOLAX Take 17 g by mouth daily as needed for mild constipation.   senna 8.6 MG Tabs tablet Commonly known as: SENOKOT Take 1 tablet by mouth at bedtime.   SYSTANE OP Place 1 drop into both eyes daily as needed (dry eyes).   ZyrTEC Allergy 10 MG tablet Generic drug: cetirizine Take 10 mg by mouth daily.        Follow-up Information     Schedule an appointment as soon as possible for a visit  with Dorothey Baseman, MD.   Specialty: Family Medicine Contact information: [address] [place] [address] [phone]                Allergies  Allergen Reactions   Cortisone Swelling        Penicillins Swelling    Did it involve swelling of the face/tongue/throat, SOB, or low BP? No Did it involve  sudden or severe rash/hives, skin peeling, or any reaction on the inside of your mouth or nose? No Did you need to seek medical attention at a hospital or doctor's office? No When did it last happen?      7-8 years If all above answers are "NO", may proceed with cephalosporin use.    Valium [Diazepam] Other (See Comments)    Couldn't walk    Consultations: Neurosurgery   Procedures/Studies: DG Chest Port 1 View  Result Date: 05/26/2022 CLINICAL DATA:  Hypoxia, history of hypertension EXAM: PORTABLE CHEST 1 VIEW COMPARISON:  Chest radiograph 09/09/2021 FINDINGS: The heart size is stable. The upper mediastinum is prominent, increased since the study from 09/09/2021, though likely at least in part due to rightward patient rotation. There is no focal consolidation or pulmonary edema. There is no pleural effusion or pneumothorax. Retrocardiac opacity is consistent with a large hiatal hernia as seen on prior studies. There is no acute osseous abnormality. IMPRESSION: 1. Prominent upper mediastinum is  most likely due to patient rotation. Consider repeat upright PA and lateral radiographs for better evaluation. 2. Otherwise, no radiographic evidence of acute cardiopulmonary process. Electronically Signed   By: Lesia Hausen M.D.   On: 05/26/2022 08:31   CT Angio Head W or Wo Contrast  Result Date: 05/26/2022 CLINICAL DATA:  Initial evaluation for acute headache. History of aneurysm, untreated. EXAM: CT ANGIOGRAPHY HEAD TECHNIQUE: Multidetector CT imaging of the head was performed using the standard protocol during bolus administration of intravenous contrast. Multiplanar CT image reconstructions and MIPs were obtained to evaluate the vascular anatomy. RADIATION DOSE REDUCTION: This exam was performed according to the departmental dose-optimization program which includes automated exposure control, adjustment of the mA and/or kV according to patient size and/or use of iterative reconstruction technique.  CONTRAST:  39mL OMNIPAQUE IOHEXOL 350 MG/ML SOLN COMPARISON:  Prior Study from 09/09/2021. FINDINGS: CT HEAD Brain: Cerebral volume within normal limits. Chronic microvascular ischemic disease noted involving the supratentorial cerebral white matter. No acute intracranial hemorrhage. No acute large vessel territory infarct. No mass lesion or midline shift. No hydrocephalus or extra-axial fluid collection. Vascular: Large right ICA aneurysm, measuring 2.5 x 3.0 cm, increased in size from prior CTA with this previously measured up to 2.8 cm. No other hyperdense vessel. Scattered calcified atherosclerosis present at skull base. Skull: Scalp soft tissues and calvarium within normal limits. Sinuses: Chronic right sphenoid sinusitis.  No mastoid effusion. Other: Globes and orbital soft tissues demonstrate no acute finding. Prior bilateral ocular lens replacement. CTA HEAD Anterior circulation: Visualized distal cervical ICAs are irregular with multifocal irregularity and beading, which could be related FMD and/or chronic hypertension. Left ICA patent through the siphon without significant stenosis. Extremely large aneurysm arising from the cavernous right ICA again seen. The filling portion of the aneurysm sac is increased in size, now measuring 2.2 x 2.1 x 1.0 cm. Mass effect on the adjacent right lateral rectus with associated esotropia. 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 A1 segments patent. Normal anterior communicating artery complex. Anterior cerebral arteries patent without abnormality. Right M1 widely patent. Left M 1 bifurcates at its origin. Both middle cerebral arteries patent and well perfused. Posterior circulation: Both V4 segments patent without significant stenosis. Both PICA patent. Basilar patent to its distal aspect without stenosis. Superior cerebellar arteries patent bilaterally. Left PCA supplied via the basilar. Fetal type origin right PCA.  Both PCAs perfused to their distal aspects without stenosis. Venous sinuses: Not well assessed due to timing the contrast bolus. Anatomic variants: Fetal type origin of the right PCA. Review of the MIP images confirms the above findings. IMPRESSION: 1. 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 This is increased in size as compared to prior CTA from 09/09/2021. 2. 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 3. Scattered intracranial atherosclerotic disease without hemodynamically significant stenosis or other acute vascular abnormality. 4. No other acute intracranial abnormality. 5. Chronic right sphenoid sinusitis. Electronically Signed   By: Rise Mu M.D.   On: 05/26/2022 01:36   CT ABDOMEN PELVIS W CONTRAST  Result Date: 05/26/2022 CLINICAL DATA:  Nausea, vomiting. Abdominal pain, acute, nonlocalized EXAM: CT ABDOMEN AND PELVIS WITH CONTRAST TECHNIQUE: Multidetector CT imaging of the abdomen and pelvis was performed using the standard protocol following bolus administration of intravenous contrast. RADIATION DOSE REDUCTION: This exam was performed according to the  departmental dose-optimization program which includes automated exposure control, adjustment of the mA and/or kV according to patient size and/or use of iterative reconstruction technique. CONTRAST:  11mL OMNIPAQUE IOHEXOL 350 MG/ML SOLN COMPARISON:  08/30/2021 FINDINGS: Lower chest: Stable subpleural 5 mm pulmonary nodule within the left lower lobe, benign. Large hiatal hernia. Cardiac size within normal limits. Hepatobiliary: Cholelithiasis without pericholecystic inflammatory change. Liver unremarkable. No intra or extrahepatic biliary ductal dilation Pancreas: Unremarkable Spleen: Unremarkable Adrenals/Urinary Tract: Adrenal glands are unremarkable. Kidneys are normal, without renal calculi, focal lesion, or  hydronephrosis. Bladder is unremarkable. Stomach/Bowel: The intrathoracic stomach, small bowel, and large bowel are unremarkable. No evidence of obstruction or focal inflammation. The appendix is not clearly identified and may be absent. No free intraperitoneal gas or fluid. Vascular/Lymphatic: Aortic atherosclerosis. No enlarged abdominal or pelvic lymph nodes. Reproductive: Uterus and bilateral adnexa are unremarkable. Other: No abdominal wall hernia Musculoskeletal: Degenerative changes seen within the lumbar spine. Bilateral L5 pars defects. No acute bone abnormality. No lytic or blastic bone lesion. IMPRESSION: 1. No acute intra-abdominal pathology identified. No definite radiographic explanation for the patient's reported symptoms. 2. Large hiatal hernia. 3. Cholelithiasis. Aortic Atherosclerosis (ICD10-I70.0). Electronically Signed   By: Helyn Numbers M.D.   On: 05/26/2022 00:51      Subjective: Seen and examined the day of discharge.  Stable no distress.  Tolerating p.o. intake.  No pain complaints.  Stable for discharge home.  Discharge Exam: Vitals:   05/29/22 0604 05/29/22 0720  BP: (!) 176/71 (!) 153/75  Pulse: 60 (!) 59  Resp:    Temp:  98.1 F (36.7 C)  SpO2: 92% 92%   Vitals:   05/29/22 0315 05/29/22 0446 05/29/22 0604 05/29/22 0720  BP: (!) 153/64 (!) 154/67 (!) 176/71 (!) 153/75  Pulse: (!) 59 60 60 (!) 59  Resp: 20 20    Temp: 97.9 F (36.6 C) 98.3 F (36.8 C)  98.1 F (36.7 C)  TempSrc: Tympanic   Oral  SpO2: 95% 90% 92% 92%  Weight:      Height:        General: Pt is alert, awake, not in acute distress Cardiovascular: RRR, S1/S2 +, no rubs, no gallops Respiratory: CTA bilaterally, no wheezing, no rhonchi Abdominal: Soft, NT, ND, bowel sounds + Extremities: no edema, no cyanosis    The results of significant diagnostics from this hospitalization (including imaging, microbiology, ancillary and laboratory) are listed below for reference.      Microbiology: No results found for this or any previous visit (from the past 240 hour(s)).   Labs: BNP (last 3 results) No results for input(s): "BNP" in the last 8760 hours. Basic Metabolic Panel: Recent Labs  Lab 05/25/22 2329 05/26/22 0509 05/27/22 0404  NA 133* 134* 138  K 3.2* 3.3* 4.3  CL 96* 98 105  CO2 28 27 29   GLUCOSE 139* 147* 91  BUN 9 9 13   CREATININE 0.58 0.53 0.72  CALCIUM 9.1 8.9 9.3  MG 2.1  --   --    Liver Function Tests: Recent Labs  Lab 05/25/22 2329 05/26/22 0509  AST 21 20  ALT 10 10  ALKPHOS 53 48  BILITOT 1.1 0.9  PROT 7.2 7.0  ALBUMIN 3.9 3.6   Recent Labs  Lab 05/25/22 2329  LIPASE 25   No results for input(s): "AMMONIA" in the last 168 hours. CBC: Recent Labs  Lab 05/25/22 2329 05/26/22 0509  WBC 5.1 5.3  HGB 14.7 13.8  HCT 43.1 40.2  MCV 96.6  96.9  PLT 210 199   Cardiac Enzymes: No results for input(s): "CKTOTAL", "CKMB", "CKMBINDEX", "TROPONINI" in the last 168 hours. BNP: Invalid input(s): "POCBNP" CBG: No results for input(s): "GLUCAP" in the last 168 hours. D-Dimer No results for input(s): "DDIMER" in the last 72 hours. Hgb A1c No results for input(s): "HGBA1C" in the last 72 hours. Lipid Profile No results for input(s): "CHOL", "HDL", "LDLCALC", "TRIG", "CHOLHDL", "LDLDIRECT" in the last 72 hours. Thyroid function studies No results for input(s): "TSH", "T4TOTAL", "T3FREE", "THYROIDAB" in the last 72 hours.  Invalid input(s): "FREET3" Anemia work up No results for input(s): "VITAMINB12", "FOLATE", "FERRITIN", "TIBC", "IRON", "RETICCTPCT" in the last 72 hours. Urinalysis    Component Value Date/Time   COLORURINE YELLOW (A) 05/25/2022 2330   APPEARANCEUR CLEAR (A) 05/25/2022 2330   APPEARANCEUR CLEAR 10/07/2014 0958   LABSPEC >1.046 (H) 05/25/2022 2330   LABSPEC 1.015 10/07/2014 0958   PHURINE 7.0 05/25/2022 2330   GLUCOSEU NEGATIVE 05/25/2022 2330   GLUCOSEU NEGATIVE 10/07/2014 0958   HGBUR NEGATIVE  05/25/2022 2330   BILIRUBINUR NEGATIVE 05/25/2022 2330   BILIRUBINUR NEGATIVE 10/07/2014 0958   KETONESUR 5 (A) 05/25/2022 2330   PROTEINUR NEGATIVE 05/25/2022 2330   NITRITE NEGATIVE 05/25/2022 2330   LEUKOCYTESUR NEGATIVE 05/25/2022 2330   LEUKOCYTESUR NEGATIVE 10/07/2014 0958   Sepsis Labs Recent Labs  Lab 05/25/22 2329 05/26/22 0509  WBC 5.1 5.3   Microbiology No results found for this or any previous visit (from the past 240 hour(s)).   Time coordinating discharge: Over 30 minutes  SIGNED:   Sidney Ace, MD  [facility] 05/29/2022, 2:44 PM Pager   If 7PM-7AM, please contact night-coverage

## 2022-05-29 NOTE — Care Management Important Message (Signed)
Important Message  Patient Details  Name: Briana Lozano MRN: [id number] Date of Birth: 06-09-1929   Medicare Important Message Given:  N/A - LOS <3 / Initial given by admissions  Late entry. Pt. Had discharged.   Olegario Messier A Harlo Jaso 05/29/2022, 12:13 PM

## 2022-08-05 ENCOUNTER — Emergency Department: Payer: Medicare Other

## 2022-08-05 ENCOUNTER — Other Ambulatory Visit: Payer: Self-pay

## 2022-08-05 ENCOUNTER — Inpatient Hospital Stay
Admission: EM | Admit: 2022-08-05 | Discharge: 2022-08-08 | DRG: 312 | Disposition: A | Discharge status: Home Health | Payer: Medicare Other | Attending: Internal Medicine | Admitting: Internal Medicine

## 2022-08-05 ENCOUNTER — Encounter: Payer: Self-pay | Admitting: Emergency Medicine

## 2022-08-05 DIAGNOSIS — R202 Paresthesia of skin: Secondary | ICD-10-CM | POA: Diagnosis present

## 2022-08-05 DIAGNOSIS — F32A Depression, unspecified: Secondary | ICD-10-CM | POA: Diagnosis present

## 2022-08-05 DIAGNOSIS — Z79899 Other long term (current) drug therapy: Secondary | ICD-10-CM

## 2022-08-05 DIAGNOSIS — Z96651 Presence of right artificial knee joint: Secondary | ICD-10-CM | POA: Diagnosis present

## 2022-08-05 DIAGNOSIS — Z9841 Cataract extraction status, right eye: Secondary | ICD-10-CM

## 2022-08-05 DIAGNOSIS — Z961 Presence of intraocular lens: Secondary | ICD-10-CM | POA: Diagnosis present

## 2022-08-05 DIAGNOSIS — K449 Diaphragmatic hernia without obstruction or gangrene: Secondary | ICD-10-CM | POA: Diagnosis present

## 2022-08-05 DIAGNOSIS — R55 Syncope and collapse: Secondary | ICD-10-CM | POA: Diagnosis present

## 2022-08-05 DIAGNOSIS — G4730 Sleep apnea, unspecified: Secondary | ICD-10-CM | POA: Diagnosis present

## 2022-08-05 DIAGNOSIS — G629 Polyneuropathy, unspecified: Secondary | ICD-10-CM | POA: Diagnosis present

## 2022-08-05 DIAGNOSIS — Z8616 Personal history of COVID-19: Secondary | ICD-10-CM

## 2022-08-05 DIAGNOSIS — Z9842 Cataract extraction status, left eye: Secondary | ICD-10-CM

## 2022-08-05 DIAGNOSIS — H548 Legal blindness, as defined in USA: Secondary | ICD-10-CM | POA: Diagnosis present

## 2022-08-05 DIAGNOSIS — R519 Headache, unspecified: Secondary | ICD-10-CM | POA: Diagnosis present

## 2022-08-05 DIAGNOSIS — R531 Weakness: Secondary | ICD-10-CM

## 2022-08-05 DIAGNOSIS — I951 Orthostatic hypotension: Principal | ICD-10-CM | POA: Diagnosis present

## 2022-08-05 DIAGNOSIS — E039 Hypothyroidism, unspecified: Secondary | ICD-10-CM | POA: Diagnosis present

## 2022-08-05 DIAGNOSIS — Z888 Allergy status to other drugs, medicaments and biological substances status: Secondary | ICD-10-CM

## 2022-08-05 DIAGNOSIS — R200 Anesthesia of skin: Secondary | ICD-10-CM | POA: Diagnosis present

## 2022-08-05 DIAGNOSIS — Z885 Allergy status to narcotic agent status: Secondary | ICD-10-CM

## 2022-08-05 DIAGNOSIS — I950 Idiopathic hypotension: Secondary | ICD-10-CM

## 2022-08-05 DIAGNOSIS — I671 Cerebral aneurysm, nonruptured: Secondary | ICD-10-CM | POA: Diagnosis not present

## 2022-08-05 DIAGNOSIS — Z20822 Contact with and (suspected) exposure to covid-19: Secondary | ICD-10-CM | POA: Diagnosis present

## 2022-08-05 DIAGNOSIS — H5440 Blindness, one eye, unspecified eye: Secondary | ICD-10-CM | POA: Diagnosis not present

## 2022-08-05 DIAGNOSIS — M810 Age-related osteoporosis without current pathological fracture: Secondary | ICD-10-CM | POA: Diagnosis present

## 2022-08-05 DIAGNOSIS — I10 Essential (primary) hypertension: Secondary | ICD-10-CM | POA: Diagnosis present

## 2022-08-05 DIAGNOSIS — J449 Chronic obstructive pulmonary disease, unspecified: Secondary | ICD-10-CM | POA: Diagnosis present

## 2022-08-05 DIAGNOSIS — Z79890 Hormone replacement therapy (postmenopausal): Secondary | ICD-10-CM

## 2022-08-05 DIAGNOSIS — Z9049 Acquired absence of other specified parts of digestive tract: Secondary | ICD-10-CM

## 2022-08-05 DIAGNOSIS — Z881 Allergy status to other antibiotic agents status: Secondary | ICD-10-CM

## 2022-08-05 DIAGNOSIS — K219 Gastro-esophageal reflux disease without esophagitis: Secondary | ICD-10-CM | POA: Diagnosis present

## 2022-08-05 LAB — CBC
HCT: 41.7 % (ref 36.0–46.0)
Hemoglobin: 13.8 g/dL (ref 12.0–15.0)
MCH: 33.8 pg (ref 26.0–34.0)
MCHC: 33.1 g/dL (ref 30.0–36.0)
MCV: 102.2 fL — ABNORMAL HIGH (ref 80.0–100.0)
Platelets: 199 10*3/uL (ref 150–400)
RBC: 4.08 MIL/uL (ref 3.87–5.11)
RDW: 14 % (ref 11.5–15.5)
WBC: 7.1 10*3/uL (ref 4.0–10.5)
nRBC: 0 % (ref 0.0–0.2)

## 2022-08-05 LAB — COMPREHENSIVE METABOLIC PANEL
ALT: 12 U/L (ref 0–44)
AST: 26 U/L (ref 15–41)
Albumin: 3.6 g/dL (ref 3.5–5.0)
Alkaline Phosphatase: 43 U/L (ref 38–126)
Anion gap: 7 (ref 5–15)
BUN: 11 mg/dL (ref 8–23)
CO2: 24 mmol/L (ref 22–32)
Calcium: 8.8 mg/dL — ABNORMAL LOW (ref 8.9–10.3)
Chloride: 105 mmol/L (ref 98–111)
Creatinine, Ser: 0.76 mg/dL (ref 0.44–1.00)
GFR, Estimated: >60 mL/min (ref >60)
Glucose, Bld: 131 mg/dL — ABNORMAL HIGH (ref 70–99)
Potassium: 4.8 mmol/L (ref 3.5–5.1)
Sodium: 136 mmol/L (ref 135–145)
Total Bilirubin: 1.4 mg/dL — ABNORMAL HIGH (ref 0.3–1.2)
Total Protein: 6.6 g/dL (ref 6.5–8.1)

## 2022-08-05 LAB — TROPONIN I (HIGH SENSITIVITY)
Troponin I (High Sensitivity): 8 ng/L (ref <18)
Troponin I (High Sensitivity): 8 ng/L (ref <18)

## 2022-08-05 MED ORDER — SENNA 8.6 MG PO TABS
1.0000 | ORAL_TABLET | Freq: Every day | ORAL | Status: DC
  Administered 2022-08-06 – 2022-08-07 (×2): 8.6 mg via ORAL
  Filled 2022-08-05 (×3): qty 1

## 2022-08-05 MED ORDER — TRAZODONE HCL 50 MG PO TABS
25.0000 mg | ORAL_TABLET | Freq: Every evening | ORAL | Status: DC | PRN
  Administered 2022-08-05 – 2022-08-07 (×3): 25 mg via ORAL
  Filled 2022-08-05 (×3): qty 1

## 2022-08-05 MED ORDER — LEVOTHYROXINE SODIUM 50 MCG PO TABS: 75.0000 ug | ORAL_TABLET | Freq: Every day | ORAL | Status: DC

## 2022-08-05 MED ORDER — MORPHINE SULFATE (PF) 2 MG/ML IV SOLN
2.0000 mg | INTRAVENOUS | Status: DC | PRN
  Administered 2022-08-05: 2 mg via INTRAVENOUS
  Filled 2022-08-05: qty 1

## 2022-08-05 MED ORDER — ONDANSETRON HCL 4 MG/2ML IJ SOLN
4.0000 mg | Freq: Four times a day (QID) | INTRAMUSCULAR | Status: DC | PRN
Start: 2022-08-05 — End: 2022-08-08
  Administered 2022-08-06: 4 mg via INTRAVENOUS
  Filled 2022-08-05: qty 2

## 2022-08-05 MED ORDER — HYDRALAZINE HCL 20 MG/ML IJ SOLN
10.0000 mg | Freq: Four times a day (QID) | INTRAMUSCULAR | Status: DC | PRN
  Administered 2022-08-05 – 2022-08-06 (×2): 10 mg via INTRAVENOUS
  Filled 2022-08-05 (×2): qty 1

## 2022-08-05 MED ORDER — ONDANSETRON HCL 4 MG PO TABS: 4.0000 mg | ORAL_TABLET | Freq: Four times a day (QID) | ORAL | Status: DC | PRN

## 2022-08-05 MED ORDER — POLYETHYLENE GLYCOL 3350 17 G PO PACK
17.0000 g | PACK | Freq: Every day | ORAL | Status: DC | PRN
  Administered 2022-08-06 – 2022-08-08 (×3): 17 g via ORAL
  Filled 2022-08-05 (×3): qty 1

## 2022-08-05 MED ORDER — LABETALOL HCL 5 MG/ML IV SOLN: 20.0000 mg | INTRAVENOUS | Status: DC | PRN

## 2022-08-05 MED ORDER — PANTOPRAZOLE SODIUM 40 MG PO TBEC
40.0000 mg | DELAYED_RELEASE_TABLET | Freq: Every day | ORAL | Status: DC
  Administered 2022-08-05 – 2022-08-07 (×3): 40 mg via ORAL
  Filled 2022-08-05 (×3): qty 1

## 2022-08-05 MED ORDER — ACETAMINOPHEN 325 MG PO TABS
650.0000 mg | ORAL_TABLET | Freq: Four times a day (QID) | ORAL | Status: DC | PRN
  Administered 2022-08-05 – 2022-08-07 (×4): 650 mg via ORAL
  Filled 2022-08-05 (×4): qty 2

## 2022-08-05 MED ORDER — CITALOPRAM HYDROBROMIDE 20 MG PO TABS
10.0000 mg | ORAL_TABLET | Freq: Every day | ORAL | Status: DC
  Administered 2022-08-06 – 2022-08-07 (×2): 10 mg via ORAL
  Filled 2022-08-05 (×2): qty 1

## 2022-08-05 MED ORDER — MIDODRINE HCL 5 MG PO TABS: 2.5000 mg | ORAL_TABLET | Freq: Every day | ORAL | Status: DC | PRN

## 2022-08-05 MED ORDER — GABAPENTIN 300 MG PO CAPS
300.0000 mg | ORAL_CAPSULE | Freq: Three times a day (TID) | ORAL | Status: DC
  Administered 2022-08-05 – 2022-08-08 (×8): 300 mg via ORAL
  Filled 2022-08-05 (×8): qty 1

## 2022-08-05 MED ORDER — ENSURE ENLIVE PO LIQD
237.0000 mL | Freq: Two times a day (BID) | ORAL | Status: DC
  Administered 2022-08-06 – 2022-08-08 (×4): 237 mL via ORAL

## 2022-08-05 MED ORDER — LEVOTHYROXINE SODIUM 50 MCG PO TABS
75.0000 ug | ORAL_TABLET | Freq: Every day | ORAL | Status: DC
  Administered 2022-08-05 – 2022-08-08 (×3): 75 ug via ORAL
  Filled 2022-08-05 (×3): qty 2

## 2022-08-05 MED ORDER — BISACODYL 10 MG RE SUPP
10.0000 mg | Freq: Every day | RECTAL | Status: DC | PRN
Start: 2022-08-05 — End: 2022-08-08

## 2022-08-05 MED ORDER — ACETAMINOPHEN 650 MG RE SUPP: 650.0000 mg | Freq: Four times a day (QID) | RECTAL | Status: DC | PRN

## 2022-08-05 NOTE — Assessment & Plan Note (Signed)
-   Citalopram 10 mg daily resumed

## 2022-08-05 NOTE — Assessment & Plan Note (Signed)
-   This is patient's baseline

## 2022-08-05 NOTE — ED Notes (Signed)
Pt reports filling dizzy while on the toilet earlier and called for her Caregiver.  Pt had syncopal episode as Caregiver was entering the room.  Pt did not fall off from the toilet.

## 2022-08-05 NOTE — ED Notes (Signed)
Patient transported to X-ray 

## 2022-08-05 NOTE — Assessment & Plan Note (Signed)
Fall precautions.  

## 2022-08-05 NOTE — ED Provider Notes (Signed)
[facility] Provider Note    Event Date/Time   First MD Initiated Contact with Patient 08/05/22 1519     (approximate)   History   Loss of Consciousness   HPI  Briana Lozano is a 88 y.o. female who was sitting on the toilet having a soft stool without straining and passed out.  She became dizzy and lightheaded.  She is now better.  She has not had any of her medicines though and has not eaten anything or drank anything all day. Patient has an ICA aneurysm which is chronic and stable.     Physical Exam   Triage Vital Signs: ED Triage Vitals [08/05/22 1001]  Enc Vitals Group     BP (!) 173/79     Pulse Rate 60     Resp 16     Temp 97.6 F (36.4 C)     Temp Source Oral     SpO2 94 %     Weight 116 lb (52.6 kg)     Height 5\' 3"  (1.6 m)     Head Circumference      Peak Flow      Pain Score 0     Pain Loc      Pain Edu?      Excl. in GC?     Most recent vital signs: Vitals:   08/05/22 1512 08/05/22 1635  BP: (!) 165/94 (!) 174/96  Pulse: 61 61  Resp: 16 16  Temp: 97.8 F (36.6 C)   SpO2: 96% 95%     General: Awake, no distress.  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  Regular rate and rhythm no audible murmurs Resp:  Normal effort.  Lungs are clear Abd:  No distention.  Soft and nontender Extremities with no edema Patient reports extremities are all working normally feel normal there is no numbness no obvious neurological deficits   ED Results / Procedures / Treatments   Labs (all labs ordered are listed, but only abnormal results are displayed) Labs Reviewed  CBC - Abnormal; Notable for the following components:      Result Value   MCV 102.2 (*)    All other components within normal limits  COMPREHENSIVE METABOLIC PANEL - Abnormal; Notable for the following components:   Glucose, Bld 131 (*)     Calcium 8.8 (*)    Total Bilirubin 1.4 (*)    All other components within normal limits  URINALYSIS, ROUTINE W REFLEX MICROSCOPIC  CBG MONITORING, ED  TROPONIN I (HIGH SENSITIVITY)  TROPONIN I (HIGH SENSITIVITY)     EKG  EKG read and interpreted by me shows sinus bradycardia rate of 57 left axis no acute changes   RADIOLOGY CT of the head read by radiology reviewed and interpreted by me shows a known giant ICA aneurysm which is stable per radiology.  No other acute findings or other findings.  Are seen.   PROCEDURES:  Critical Care performed:   Procedures   MEDICATIONS ORDERED IN ED: Medications - No data to display   IMPRESSION / MDM / ASSESSMENT AND PLAN / ED COURSE  I reviewed the triage vital signs and the nursing notes. Labs initially are okay.  Blood pressure slightly high patient has not had her medicine today.  Differential diagnosis includes, but is not limited to, cardiac arrhythmia or vasovagal episode.  Patient reportedly was not pressing  very hard or straining at all because the stool was soft due to her taking MiraLAX so not sure that this is the case.  Patient's presentation is most consistent with acute complicated illness / injury requiring diagnostic workup.  The patient is on the cardiac monitor to evaluate for evidence of arrhythmia and/or significant heart rate changes.  None have been seen     FINAL CLINICAL IMPRESSION(S) / ED DIAGNOSES   Final diagnoses:  Syncope and collapse     Rx / DC Orders   ED Discharge Orders     None        Note:  This document was prepared using Dragon voice recognition software and may include unintentional dictation errors.   Arnaldo Natal, MD 08/05/22 1700

## 2022-08-05 NOTE — ED Triage Notes (Signed)
Pt was trying to have bowel movement this AM and passed out per pt while on toilet.  Pt is oriented at this time. NAD. VSS. Pt denies any pain.  Right eye is droopy but baseline r/t brain aneurysm; cannot see out this eye at baseline.  Pt currently has no complaints or sx.  No fevers or recent infection sx. Caregiver reports she did not fall or hit head.

## 2022-08-05 NOTE — H&P (Signed)
History and Physical   Briana Lozano UXL:[id number] DOB: January 28, 1929 DOA: 08/05/2022  PCP: Dorothey Baseman, MD  Outpatient Specialists: Dr. Vic Blackbird, [facility] Patient coming from: Home  I have personally briefly reviewed patient's old medical records in [facility] EMR.  Chief Concern: Syncope  HPI: Ms. Briana Lozano is a 88 year old female with history of hypertension, hiatal hernia, hypothyroid, known giant intracranial cavernous ICA aneurysm, vasovagal syncope, orthostatic hypotension, who presents to the emergency department for chief concerns of syncope while sitting on the toilet.  Initial vitals in the emergency department showed temperature of 97.7, respiration rate of 16, heart rate of 60, blood pressure 173/79, SPO2 of 94% on room air.  Serum sodium is 136, potassium 4.8, chloride 105, bicarb 24, BUN of 11, serum creatinine 0.76, GFR greater than 60, nonfasting blood glucose 131, WBC 7.1, hemoglobin 13.8, platelets of 199.  High sensitive troponin was 8.  CT of the head without contrast was read as no acute finding.  Known giant cavernous ICA aneurysm on the right.  Chest x-ray 2 view: Was read as moderate hiatal hernia.  No other abnormalities.  ED treatment: None  At bedside patient was able to tell me her full name, her age, she knows her friend at bedside, and she knows she is in the hospital.  She reports that she was about to go out to breakfast and felt like she needed to have a bowel movement so she went to the restroom.  She then felt dizzy and weak and called to her friend who is also her caregiver.  Her friend came in to the restroom saw the patient and the patient probably passed out in front of her friend at bedside.  Friend reports that the bowel movement appears normal and did not appear to be difficult.  At bedside patient denies chest pain, shortness of breath, dysuria, hematuria, diarrhea.  Patient denies straining, blood in  her stool.  ROS: Constitutional: no weight change, no fever ENT/Mouth: no sore throat, no rhinorrhea Eyes: no eye pain, no vision changes Cardiovascular: no chest pain, no dyspnea,  no edema, no palpitations Respiratory: no cough, no sputum, no wheezing Gastrointestinal: no nausea, no vomiting, no diarrhea, no constipation Genitourinary: no urinary incontinence, no dysuria, no hematuria Musculoskeletal: no arthralgias, no myalgias Skin: no skin lesions, no pruritus, Neuro: + weakness, no loss of consciousness, no syncope Psych: no anxiety, no depression, + decrease appetite Heme/Lymph: no bruising, no bleeding  ED Course: Discussed with emergency medicine provider, patient requiring hospitalization for chief concerns of syncope.  Assessment/Plan  Principal Problem:   Syncope Active Problems:   Hypothyroidism   Brain aneurysm   Depression   Right facial pain   Weakness   Numbness and tingling of right side of face   Blindness of left eye   Assessment and Plan:  * Syncope - Presumed vasovagal - Admit to telemetry medical, observation - Resumed home midodrine 2.5 mg daily as needed for orthostatic hypotension - Patient has resumed to baseline mental status, anticipate discharge in the a.m.  Blindness of left eye - This is patient's baseline  Weakness - Fall precautions  Right facial pain Right sided facial neuropathy - Resumed home gabapentin 300 mg 3 times daily  Depression - Citalopram 10 mg daily resumed  Hypothyroidism - Levothyroxine 75 mcg daily resumed  Code status-patient states that she signed a DNR documentation with her outpatient provider however states that on this hospitalization she would like to remain full code  DVT prophylaxis-I have not ordered pharmacologic DVT prophylaxis due to known large aneurysm.  Chart reviewed.   DVT prophylaxis: SCDs Code Status: Full code Diet: Heart healthy Family Communication: Updated friend and caregiver at  bedside with patient's permission Disposition Plan: Pending clinical course Consults called: None at this time Admission status: Telemetry medical, observation  Past Medical History:  Diagnosis Date   Arthritis    fingers   Brain aneurysm    COPD (chronic obstructive pulmonary disease) (HCC)    wheezing occasionally   COVID-19 12/19/2019   mild symptoms   Depression    Dyspnea    with exertion   GERD (gastroesophageal reflux disease)    Headache    Heart murmur    followed by PCP   History of hiatal hernia    History of phlebitis    Hypertension    Hypothyroidism    Osteoporosis    Pneumonia    in PAST   Sleep apnea    no CPAP   Vertigo    in past   Wears dentures    full upper   Past Surgical History:  Procedure Laterality Date   APPENDECTOMY     BREAST BIOPSY     CATARACT EXTRACTION W/PHACO Left 11/18/2019   Procedure: CATARACT EXTRACTION PHACO AND INTRAOCULAR LENS PLACEMENT (IOC) LEFT 5.77, 00:41.2;  Surgeon: Galen Manila, MD;  Location: [facility];  Service: Ophthalmology;  Laterality: Left;  sleep apnea   CATARACT EXTRACTION W/PHACO Right 01/20/2020   Procedure: CATARACT EXTRACTION PHACO AND INTRAOCULAR LENS PLACEMENT (IOC) RIGHT;  Surgeon: Galen Manila, MD;  Location: [facility];  Service: Ophthalmology;  Laterality: Right;  Lot #[id number] H [place]: 07:12 CDE: 00:59.3    COLONOSCOPY  2002,2008,2015   EYE SURGERY     HEMORRHOID SURGERY  1950   IR ANGIO INTRA EXTRACRAN SEL COM CAROTID INNOMINATE BILAT MOD SED  04/17/2017   IR ANGIO VERTEBRAL SEL VERTEBRAL UNI L MOD SED  04/17/2017   REPLACEMENT TOTAL KNEE Right 04/2005   Social History:  reports that she has never smoked. She has never used smokeless tobacco. She reports that she does not drink alcohol and does not use drugs.  Allergies  Allergen Reactions   Cortisone Swelling        Penicillins Swelling    Did it involve swelling of the face/tongue/throat, SOB, or low BP? No Did it involve sudden  or severe rash/hives, skin peeling, or any reaction on the inside of your mouth or nose? No Did you need to seek medical attention at a hospital or doctor's office? No When did it last happen?      7-8 years If all above answers are "NO", may proceed with cephalosporin use.    Valium [Diazepam] Other (See Comments)    Couldn't walk   Family History  Problem Relation Age of Onset   Stroke Mother    Lung cancer Father    Lung cancer Brother    Breast cancer Neg Hx    Family history: Family history reviewed and not pertinent.  Prior to Admission medications   Medication Sig Start Date End Date Taking? Authorizing Provider  acetaminophen (TYLENOL) 650 MG CR tablet Take 1,000 mg by mouth every 8 (eight) hours as needed for pain.    [provider]  bisacodyl (DULCOLAX) 10 MG suppository Place 1 suppository (10 mg total) rectally daily as needed for moderate constipation. 09/14/21   Enedina Finner, MD  bisoprolol (ZEBETA) 5 MG tablet Take 0.5 tablets by mouth daily.  04/07/22   [provider]  citalopram (CELEXA) 10 MG tablet Take 1 tablet (10 mg total) by mouth daily. 09/24/21   Alford Highland, MD  cyanocobalamin (,VITAMIN B-12,) 1000 MCG/ML injection Inject 1,000 mcg into the muscle every 30 (thirty) days.    [provider]  feeding supplement (ENSURE ENLIVE / ENSURE PLUS) LIQD Take 237 mLs by mouth 2 (two) times daily between meals. 09/23/21   Alford Highland, MD  gabapentin (NEURONTIN) 300 MG capsule Take 1 capsule (300 mg total) by mouth 3 (three) times daily. 09/28/21   Alford Highland, MD  ibuprofen (ADVIL) 400 MG tablet Take 2 tablets (800 mg total) by mouth every 8 (eight) hours as needed for moderate pain (please try tyelenol first). Patient taking differently: Take 600 mg by mouth every 6 (six) hours as needed for moderate pain (please try tyelenol first). 09/23/21   Alford Highland, MD  levothyroxine (SYNTHROID) 75 MCG tablet Take 75 mcg by mouth daily  before breakfast.    [provider]  midodrine (PROAMATINE) 2.5 MG tablet Take 1 tablet (2.5 mg total) by mouth daily as needed (orthostatic hypotension, IF SBP drops below 100 with standing can give 1 hour before working with PT). 09/23/21   Alford Highland, MD  ondansetron (ZOFRAN ODT) 4 MG disintegrating tablet Take 1 tablet (4 mg total) by mouth every 8 (eight) hours as needed for nausea or vomiting. 09/07/21   Chesley Noon, MD  ondansetron (ZOFRAN) 4 MG tablet Take 1 tablet (4 mg total) by mouth every 6 (six) hours as needed for nausea. 05/29/22   Tresa Moore, MD  pantoprazole (PROTONIX) 40 MG tablet Take 1 tablet (40 mg total) by mouth at bedtime. 09/23/21   Alford Highland, MD  Polyethyl Glycol-Propyl Glycol (SYSTANE OP) Place 1 drop into both eyes daily as needed (dry eyes).     [provider]  polyethylene glycol (MIRALAX / GLYCOLAX) packet Take 17 g by mouth daily as needed for mild constipation.     [provider]  senna (SENOKOT) 8.6 MG TABS tablet Take 1 tablet by mouth at bedtime.    [provider]  ZYRTEC ALLERGY 10 MG tablet Take 10 mg by mouth daily. 04/12/22   [provider]   Physical Exam: Vitals:   08/05/22 1512 08/05/22 1635 08/05/22 1702 08/05/22 1805  BP: (!) 165/94 (!) 174/96 (!) 187/86 (!) 178/89  Pulse: 61 61 64 65  Resp: 16 16 16 16   Temp: 97.8 F (36.6 C)   98.2 F (36.8 C)  TempSrc: Oral     SpO2: 96% 95% 96% 97%  Weight:      Height:       Constitutional: appears age-appropriate, frail, NAD, calm, comfortable Eyes: PERRL on the left side, lids and conjunctivae normal on the left side.  Patient is legally blind on the right side. ENMT: Mucous membranes are moist. Posterior pharynx clear of any exudate or lesions. Age-appropriate dentition. Hearing appropriate Neck: normal, supple, no masses, no thyromegaly Respiratory: clear to auscultation bilaterally, no wheezing, no crackles. Normal respiratory  effort. No accessory muscle use.  Cardiovascular: Regular rate and rhythm, no murmurs / rubs / gallops. No extremity edema. 2+ pedal pulses. No carotid bruits.  Abdomen: no tenderness, no masses palpated, no hepatosplenomegaly. Bowel sounds positive.  Musculoskeletal: no clubbing / cyanosis. No joint deformity upper and lower extremities. Good ROM, no contractures, no atrophy. Normal muscle tone.  Skin: no rashes, lesions, ulcers. No induration Neurologic: Sensation intact. Strength 5/5 in  all 4.  Psychiatric: Normal judgment and insight. Alert and oriented x 3. Normal mood.   EKG: independently reviewed, showing sinus bradycardia with rate of 57, QTc 451  Chest x-ray on Admission: I personally reviewed and I agree with radiologist reading as below.  DG Chest 2 View  Result Date: 08/05/2022 CLINICAL DATA:  Syncope EXAM: CHEST - 2 VIEW COMPARISON:  May 26, 2022 FINDINGS: Moderate hiatal hernia. The heart, hila, mediastinum, lungs, and pleura are otherwise unremarkable. IMPRESSION: Moderate hiatal hernia.  No other abnormalities. Electronically Signed   By: Gerome Sam III M.D.   On: 08/05/2022 15:53   CT Head Wo Contrast  Result Date: 08/05/2022 CLINICAL DATA:  Syncope on toilet this morning EXAM: CT HEAD WITHOUT CONTRAST TECHNIQUE: Contiguous axial images were obtained from the base of the skull through the vertex without intravenous contrast. RADIATION DOSE REDUCTION: This exam was performed according to the departmental dose-optimization program which includes automated exposure control, adjustment of the mA and/or kV according to patient size and/or use of iterative reconstruction technique. 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 No adjacent edema or hemorrhage. Dimensions continue to measure up to 3 cm. Mild chronic small vessel ischemia for age. No  acute infarct, hydrocephalus, or collection Vascular: No hyperdense vessel or unexpected calcification. Skull: Normal. Negative for fracture or focal lesion. Sinuses/Orbits: No acute finding. IMPRESSION: 1. No acute finding. 2. Known giant cavernous ICA aneurysm on the right. Electronically Signed   By: Tiburcio Pea M.D.   On: 08/05/2022 11:07    Labs on Admission: I have personally reviewed following labs  CBC: Recent Labs  Lab 08/05/22 1009  WBC 7.1  HGB 13.8  HCT 41.7  MCV 102.2*  PLT 199   Basic Metabolic Panel: Recent Labs  Lab 08/05/22 1009  NA 136  K 4.8  CL 105  CO2 24  GLUCOSE 131*  BUN 11  CREATININE 0.76  CALCIUM 8.8*   GFR: Estimated Creatinine Clearance: 38.7 mL/min (by C-G formula based on SCr of 0.76 mg/dL).  Liver Function Tests: Recent Labs  Lab 08/05/22 1009  AST 26  ALT 12  ALKPHOS 43  BILITOT 1.4*  PROT 6.6  ALBUMIN 3.6   Urine analysis:    Component Value Date/Time   COLORURINE YELLOW (A) 05/25/2022 2330   APPEARANCEUR CLEAR (A) 05/25/2022 2330   APPEARANCEUR CLEAR 10/07/2014 0958   LABSPEC >1.046 (H) 05/25/2022 2330   LABSPEC 1.015 10/07/2014 0958   PHURINE 7.0 05/25/2022 2330   GLUCOSEU NEGATIVE 05/25/2022 2330   GLUCOSEU NEGATIVE 10/07/2014 0958   HGBUR NEGATIVE 05/25/2022 2330   BILIRUBINUR NEGATIVE 05/25/2022 2330   BILIRUBINUR NEGATIVE 10/07/2014 0958   KETONESUR 5 (A) 05/25/2022 2330   PROTEINUR NEGATIVE 05/25/2022 2330   NITRITE NEGATIVE 05/25/2022 2330   LEUKOCYTESUR NEGATIVE 05/25/2022 2330   LEUKOCYTESUR NEGATIVE 10/07/2014 0958   Dr. Sedalia Muta [facility]  If 7PM-7AM, please contact overnight-coverage provider If 7AM-7PM, please contact day coverage provider [email or web address]  08/05/2022, 7:02 PM

## 2022-08-05 NOTE — Assessment & Plan Note (Signed)
-   Levothyroxine 75 mcg daily resumed 

## 2022-08-05 NOTE — Assessment & Plan Note (Addendum)
-   Presumed vasovagal - Admit to telemetry medical, observation - Resumed home midodrine 2.5 mg daily as needed for orthostatic hypotension - Patient has resumed to baseline mental status, anticipate discharge in the a.m.

## 2022-08-05 NOTE — ED Triage Notes (Signed)
Pt in via EMS from home with c/o   Pt was unresponsive sitting upright on the toilet. EMS reports pulses were faint. Pt was having a BM and had a vagal response and passed out. Pt was given of fluid, #20g to right forearm, 141/67, 118HR, 94% RA, CBG 165, 97.2 temp

## 2022-08-05 NOTE — ED Notes (Signed)
Lab notified of Troponin add-on

## 2022-08-05 NOTE — Assessment & Plan Note (Signed)
Right sided facial neuropathy - Resumed home gabapentin 300 mg 3 times daily

## 2022-08-05 NOTE — Hospital Course (Signed)
Ms. Laveta Gilkey is a 88 year old female with history of hypertension, hiatal hernia, hypothyroid, known giant intracranial cavernous ICA aneurysm, vasovagal syncope, orthostatic hypotension, who presents to the emergency department for chief concerns of syncope while sitting on the toilet.  Initial vitals in the emergency department showed temperature of 97.7, respiration rate of 16, heart rate of 60, blood pressure 173/79, SPO2 of 94% on room air.  Serum sodium is 136, potassium 4.8, chloride 105, bicarb 24, BUN of 11, serum creatinine 0.76, GFR greater than 60, nonfasting blood glucose 131, WBC 7.1, hemoglobin 13.8, platelets of 199.  High sensitive troponin was 8.  CT of the head without contrast was read as no acute finding.  Known giant cavernous ICA aneurysm on the right.  Chest x-ray 2 view: Was read as moderate hiatal hernia.  No other abnormalities.  ED treatment: None

## 2022-08-05 NOTE — ED Notes (Signed)
Hospitalist at bedside 

## 2022-08-06 DIAGNOSIS — Z96651 Presence of right artificial knee joint: Secondary | ICD-10-CM | POA: Diagnosis present

## 2022-08-06 DIAGNOSIS — F32A Depression, unspecified: Secondary | ICD-10-CM | POA: Diagnosis present

## 2022-08-06 DIAGNOSIS — Z9842 Cataract extraction status, left eye: Secondary | ICD-10-CM | POA: Diagnosis not present

## 2022-08-06 DIAGNOSIS — I950 Idiopathic hypotension: Secondary | ICD-10-CM

## 2022-08-06 DIAGNOSIS — Z881 Allergy status to other antibiotic agents status: Secondary | ICD-10-CM | POA: Diagnosis not present

## 2022-08-06 DIAGNOSIS — J449 Chronic obstructive pulmonary disease, unspecified: Secondary | ICD-10-CM | POA: Diagnosis present

## 2022-08-06 DIAGNOSIS — R55 Syncope and collapse: Secondary | ICD-10-CM | POA: Diagnosis present

## 2022-08-06 DIAGNOSIS — H548 Legal blindness, as defined in USA: Secondary | ICD-10-CM | POA: Diagnosis present

## 2022-08-06 DIAGNOSIS — K219 Gastro-esophageal reflux disease without esophagitis: Secondary | ICD-10-CM | POA: Diagnosis present

## 2022-08-06 DIAGNOSIS — K449 Diaphragmatic hernia without obstruction or gangrene: Secondary | ICD-10-CM | POA: Diagnosis present

## 2022-08-06 DIAGNOSIS — Z8616 Personal history of COVID-19: Secondary | ICD-10-CM | POA: Diagnosis not present

## 2022-08-06 DIAGNOSIS — Z888 Allergy status to other drugs, medicaments and biological substances status: Secondary | ICD-10-CM | POA: Diagnosis not present

## 2022-08-06 DIAGNOSIS — M810 Age-related osteoporosis without current pathological fracture: Secondary | ICD-10-CM | POA: Diagnosis present

## 2022-08-06 DIAGNOSIS — E039 Hypothyroidism, unspecified: Secondary | ICD-10-CM | POA: Diagnosis present

## 2022-08-06 DIAGNOSIS — H5440 Blindness, one eye, unspecified eye: Secondary | ICD-10-CM | POA: Diagnosis not present

## 2022-08-06 DIAGNOSIS — Z9841 Cataract extraction status, right eye: Secondary | ICD-10-CM | POA: Diagnosis not present

## 2022-08-06 DIAGNOSIS — G4730 Sleep apnea, unspecified: Secondary | ICD-10-CM | POA: Diagnosis present

## 2022-08-06 DIAGNOSIS — Z79899 Other long term (current) drug therapy: Secondary | ICD-10-CM | POA: Diagnosis not present

## 2022-08-06 DIAGNOSIS — I951 Orthostatic hypotension: Secondary | ICD-10-CM | POA: Diagnosis present

## 2022-08-06 DIAGNOSIS — Z885 Allergy status to narcotic agent status: Secondary | ICD-10-CM | POA: Diagnosis not present

## 2022-08-06 DIAGNOSIS — Z9049 Acquired absence of other specified parts of digestive tract: Secondary | ICD-10-CM | POA: Diagnosis not present

## 2022-08-06 DIAGNOSIS — Z79890 Hormone replacement therapy (postmenopausal): Secondary | ICD-10-CM | POA: Diagnosis not present

## 2022-08-06 DIAGNOSIS — Z20822 Contact with and (suspected) exposure to covid-19: Secondary | ICD-10-CM | POA: Diagnosis present

## 2022-08-06 DIAGNOSIS — I10 Essential (primary) hypertension: Secondary | ICD-10-CM | POA: Diagnosis present

## 2022-08-06 DIAGNOSIS — I671 Cerebral aneurysm, nonruptured: Secondary | ICD-10-CM | POA: Diagnosis present

## 2022-08-06 DIAGNOSIS — Z961 Presence of intraocular lens: Secondary | ICD-10-CM | POA: Diagnosis present

## 2022-08-06 DIAGNOSIS — G629 Polyneuropathy, unspecified: Secondary | ICD-10-CM | POA: Diagnosis present

## 2022-08-06 LAB — BASIC METABOLIC PANEL
Anion gap: 5 (ref 5–15)
BUN: 12 mg/dL (ref 8–23)
CO2: 27 mmol/L (ref 22–32)
Calcium: 9 mg/dL (ref 8.9–10.3)
Chloride: 105 mmol/L (ref 98–111)
Creatinine, Ser: 0.69 mg/dL (ref 0.44–1.00)
GFR, Estimated: >60 mL/min (ref >60)
Glucose, Bld: 93 mg/dL (ref 70–99)
Potassium: 4.2 mmol/L (ref 3.5–5.1)
Sodium: 137 mmol/L (ref 135–145)

## 2022-08-06 LAB — CBC
HCT: 40.5 % (ref 36.0–46.0)
Hemoglobin: 14 g/dL (ref 12.0–15.0)
MCH: 34.6 pg — ABNORMAL HIGH (ref 26.0–34.0)
MCHC: 34.6 g/dL (ref 30.0–36.0)
MCV: 100 fL (ref 80.0–100.0)
Platelets: 214 10*3/uL (ref 150–400)
RBC: 4.05 MIL/uL (ref 3.87–5.11)
RDW: 13.6 % (ref 11.5–15.5)
WBC: 7.5 10*3/uL (ref 4.0–10.5)
nRBC: 0 % (ref 0.0–0.2)

## 2022-08-06 MED ORDER — LEVOTHYROXINE SODIUM 50 MCG PO TABS: 75.0000 ug | ORAL_TABLET | Freq: Every day | ORAL | Status: DC

## 2022-08-06 MED ORDER — BISOPROLOL FUMARATE 5 MG PO TABS
2.5000 mg | ORAL_TABLET | Freq: Every day | ORAL | Status: DC
Start: 2022-08-07 — End: 2022-08-07
  Administered 2022-08-07: 2.5 mg via ORAL
  Filled 2022-08-06: qty 0.5

## 2022-08-06 MED ORDER — SODIUM CHLORIDE 0.9 % IV SOLN: INTRAVENOUS | Status: DC

## 2022-08-06 MED ORDER — ENOXAPARIN SODIUM 40 MG/0.4ML IJ SOSY
40.0000 mg | PREFILLED_SYRINGE | INTRAMUSCULAR | Status: DC
  Administered 2022-08-06 – 2022-08-07 (×2): 40 mg via SUBCUTANEOUS
  Filled 2022-08-06 (×2): qty 0.4

## 2022-08-06 MED ORDER — MIDODRINE HCL 5 MG PO TABS: 2.5000 mg | ORAL_TABLET | Freq: Every day | ORAL | Status: DC | PRN

## 2022-08-06 MED ORDER — AMLODIPINE BESYLATE 5 MG PO TABS
5.0000 mg | ORAL_TABLET | Freq: Every day | ORAL | Status: DC
Start: 2022-08-06 — End: 2022-08-06
  Administered 2022-08-06: 5 mg via ORAL
  Filled 2022-08-06: qty 1

## 2022-08-06 MED ORDER — BISOPROLOL FUMARATE 5 MG PO TABS
2.5000 mg | ORAL_TABLET | Freq: Every day | ORAL | Status: DC
  Administered 2022-08-06: 2.5 mg via ORAL
  Filled 2022-08-06: qty 0.5

## 2022-08-06 NOTE — Progress Notes (Signed)
[facility]  - [place] [facility]   PATIENT NAME: Briana Lozano    MR#:  [id number]  DATE OF BIRTH:  [birth year]  SUBJECTIVE:      VITALS:  Blood pressure (!) 158/68, pulse 63, temperature 97.7 F (36.5 C), temperature source Oral, resp. rate 18, height 5\' 3"  (1.6 m), weight 52.6 kg, SpO2 94 %.  PHYSICAL EXAMINATION:   GENERAL:  88 y.o.-year-old patient lying in the bed with no acute distress.  LUNGS: Normal breath sounds bilaterally, no wheezing, rales, rhonchi.  CARDIOVASCULAR: S1, S2 normal. No murmurs, rubs, or gallops.  ABDOMEN: Soft, nontender, nondistended. Bowel sounds present.  EXTREMITIES: No  edema b/l.    NEUROLOGIC: nonfocal  patient is alert and awake SKIN: No obvious rash, lesion, or ulcer.   LABORATORY PANEL:  CBC Recent Labs  Lab 08/06/22 0426  WBC 7.5  HGB 14.0  HCT 40.5  PLT 214    Chemistries  Recent Labs  Lab 08/05/22 1009 08/06/22 0426  NA 136 137  K 4.8 4.2  CL 105 105  CO2 24 27  GLUCOSE 131* 93  BUN 11 12  CREATININE 0.76 0.69  CALCIUM 8.8* 9.0  AST 26  --   ALT 12  --   ALKPHOS 43  --   BILITOT 1.4*  --    Cardiac Enzymes No results for input(s): "TROPONINI" in the last 168 hours. RADIOLOGY:  DG Chest 2 View  Result Date: 08/05/2022 CLINICAL DATA:  Syncope EXAM: CHEST - 2 VIEW COMPARISON:  May 26, 2022 FINDINGS: Moderate hiatal hernia. The heart, hila, mediastinum, lungs, and pleura are otherwise unremarkable. IMPRESSION: Moderate hiatal hernia.  No other abnormalities. Electronically Signed   By: May 28, 2022 III M.D.   On: 08/05/2022 15:53   CT Head Wo Contrast  Result Date: 08/05/2022 CLINICAL DATA:  Syncope on toilet this morning EXAM: CT HEAD WITHOUT CONTRAST TECHNIQUE: Contiguous axial images were obtained from the base of the skull through the vertex without intravenous contrast. RADIATION DOSE REDUCTION: This exam was performed according to the departmental dose-optimization program which  includes automated exposure control, adjustment of the mA and/or kV according to patient size and/or use of iterative reconstruction technique. 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 No adjacent edema or hemorrhage. Dimensions continue to measure up to 3 cm. Mild chronic small vessel ischemia for age. No acute infarct, hydrocephalus, or collection Vascular: No hyperdense vessel or unexpected calcification. Skull: Normal. Negative for fracture or focal lesion. Sinuses/Orbits: No acute finding. IMPRESSION: 1. No acute finding. 2. Known giant cavernous ICA aneurysm on the right. Electronically Signed   By: 05/28/2022 M.D.   On: 08/05/2022 11:07    Assessment and Plan  Briana Lozano is a 88 year old female with history of hypertension, hiatal hernia, hypothyroid, known giant intracranial cavernous ICA aneurysm, vasovagal syncope, orthostatic hypotension, who presents to the emergency department for chief concerns of syncope while sitting on the toilet. Patient has history of orthostatic hypotension and was admitted in October 2022 CT of the head without contrast was read as no acute finding.  Known giant cavernous ICA aneurysm on the right.   Chest x-ray 2 view: Was read as moderate hiatal hernia.  No other abnormalities.   Syncope presume d Vasovagal orthostatic hypotension (similar admission in October 2022) -- patient exhibited sign symptoms of ortho stasis will continue IV fluids -- prn midodrine --  hold BP meds (bisoprolol) -- PT OT recommends home health  Depression -- on citalopram  Left eye legally blind  Hypothyroidism -- Synthroid  Nausea -- PRN Zofran -- patient able to eat some breakfast    Procedures: Family communication : son Nash Dimmer on the phone Consults : none CODE STATUS: FULL code per son DVT Prophylaxis :lovenox Level of  care: Telemetry Medical Status is: Observation The patient remains OBS appropriate and will d/c before 2 midnights.    TOTAL TIME TAKING CARE OF THIS PATIENT: 35 minutes.  >50% time spent on counselling and coordination of care  Note: This dictation was prepared with Dragon dictation along with smaller phrase technology. Any transcriptional errors that result from this process are unintentional.  Enedina Finner M.D    [facility]   CC: Primary care physician; Dorothey Baseman, MD

## 2022-08-06 NOTE — Evaluation (Signed)
Physical Therapy Evaluation Patient Details Name: Briana Lozano MRN: [id number] DOB: [birth year] Today's Date: 08/06/2022  History of Present Illness  Pt is a 88 year old female with history of hypertension, hiatal hernia, hypothyroid, known giant intracranial cavernous ICA aneurysm, R eye blindness, heart murmur, vasovagal syncope, orthostatic hypotension, who presents to the emergency department for chief concerns of syncope while sitting on the toilet.   Clinical Impression  Patient A&Ox4, but very sleepy throughout. Reported at baseline she is normally ambulatory with rollator in the home, and [facility] for community ambulation, 1 fall two months ago prior to last hospital admission. Family is looking into assistance for the patient while he is at work, but not set up yet.  The patient was able to perform bed mobility modI, sit with good balance and supervision. Sit <> Stand with RW and minA (PT stabilized RW, pt accustomed to locked rollator). Able to stand very several minutes without LOB, but true ambulation deferred due to orthostatic hypotension. Lowest reading was with initial standing 74/46, though pt reported she felt better than earlier. RN MD notified and see vitals flowsheets for details.  Overall the patient demonstrated deficits (see "PT Problem List") that impede the patient's functional abilities, safety, and mobility and would benefit from skilled PT intervention. Recommendation at this time pending further progress with mobility is HHPT with frequent/constant supervision/assistance to maximize safety and function.        Recommendations for follow up therapy are one component of a multi-disciplinary discharge planning process, led by the attending physician.  Recommendations may be updated based on patient status, additional functional criteria and insurance authorization.  Follow Up Recommendations Home health PT      Assistance Recommended at Discharge Frequent or constant  Supervision/Assistance  Patient can return home with the following  A little help with bathing/dressing/bathroom;A little help with walking and/or transfers;Assistance with cooking/housework;Assist for transportation;Help with stairs or ramp for entrance    Equipment Recommendations None recommended by PT  Recommendations for Other Services       Functional Status Assessment Patient has had a recent decline in their functional status and demonstrates the ability to make significant improvements in function in a reasonable and predictable amount of time.     Precautions / Restrictions Precautions Precautions: Fall Restrictions Weight Bearing Restrictions: No      Mobility  Bed Mobility Overal bed mobility: Modified Independent                  Transfers Overall transfer level: Needs assistance Equipment used: Rolling walker (2 wheels) Transfers: Sit to/from Stand Sit to Stand: Min assist           General transfer comment: PT stabilization of RW, pt used to rollator with breaks in home    Ambulation/Gait   Gait Distance (Feet): 1 Feet           General Gait Details: unable due to orthostatic hypotension  Stairs            Wheelchair Mobility    Modified Rankin (Stroke Patients Only)       Balance Overall balance assessment: Needs assistance Sitting-balance support: Feet supported Sitting balance-Leahy Scale: Good       Standing balance-Leahy Scale: Fair                               Pertinent Vitals/Pain Pain Assessment Pain Assessment: No/denies pain    Home Living Family/patient expects to  be discharged to:: Private residence Living Arrangements: Children Available Help at Discharge: Family;Available PRN/intermittently;Other (Comment) (looking to hire PCA but not set up yet for when son is at work) Type of Home: Apartment Home Access: Level entry       Home Layout: One level Home Equipment: Tub bench;Cane -  quad;Rollator (4 wheels);Rolling Walker (2 wheels);Toilet riser;Hand held shower head      Prior Function Prior Level of Function : Independent/Modified Independent             Mobility Comments: Ambulatory with rollator, endorses 1 fall prior to last hospital admission.uses SPC per her report for community ambulation ADLs Comments: family assists with cooking & cleaning     Hand Dominance   Dominant Hand: Right    Extremity/Trunk Assessment   Upper Extremity Assessment Upper Extremity Assessment: Generalized weakness    Lower Extremity Assessment Lower Extremity Assessment: Generalized weakness    Cervical / Trunk Assessment Cervical / Trunk Assessment: Normal  Communication   Communication: No difficulties  Cognition Arousal/Alertness: Awake/alert (but sleepy throughout) Behavior During Therapy: WFL for tasks assessed/performed Overall Cognitive Status: Within Functional Limits for tasks assessed                                          General Comments      Exercises     Assessment/Plan    PT Assessment Patient needs continued PT services  PT Problem List Decreased strength;Decreased mobility;Decreased range of motion;Decreased activity tolerance;Decreased balance       PT Treatment Interventions DME instruction;Therapeutic exercise;Balance training;Gait training;Stair training;Neuromuscular re-education;Functional mobility training;Therapeutic activities;Patient/family education    PT Goals (Current goals can be found in the Care Plan section)  Acute Rehab PT Goals Patient Stated Goal: go home, but only when she is ready PT Goal Formulation: With patient Time For Goal Achievement: 08/20/22 Potential to Achieve Goals: Fair    Frequency Min 2X/week     Co-evaluation               AM-PAC PT "6 Clicks" Mobility  Outcome Measure Help needed turning from your back to your side while in a flat bed without using bedrails?:  None Help needed moving from lying on your back to sitting on the side of a flat bed without using bedrails?: None Help needed moving to and from a bed to a chair (including a wheelchair)?: None Help needed standing up from a chair using your arms (e.g., wheelchair or bedside chair)?: None Help needed to walk in hospital room?: A Little Help needed climbing 3-5 steps with a railing? : A Lot 6 Click Score: 21    End of Session   Activity Tolerance: Other (comment) (limited by orthostatic hypotension) Patient left: in bed;with call bell/phone within reach;with bed alarm set Nurse Communication: Mobility status PT Visit Diagnosis: Other abnormalities of gait and mobility (R26.89);Difficulty in walking, not elsewhere classified (R26.2);Muscle weakness (generalized) (M62.81)    Time: 1300-1316 PT Time Calculation (min) (ACUTE ONLY): 16 min   Charges:   PT Evaluation $PT Eval Low Complexity: 1 Low PT Treatments $Therapeutic Activity: 8-22 mins        Olga Coaster PT, DPT 1:30 PM,08/06/22

## 2022-08-07 DIAGNOSIS — R55 Syncope and collapse: Secondary | ICD-10-CM | POA: Diagnosis not present

## 2022-08-07 DIAGNOSIS — I950 Idiopathic hypotension: Secondary | ICD-10-CM

## 2022-08-07 DIAGNOSIS — H5440 Blindness, one eye, unspecified eye: Secondary | ICD-10-CM | POA: Diagnosis not present

## 2022-08-07 MED ORDER — MIDODRINE HCL 5 MG PO TABS
5.0000 mg | ORAL_TABLET | Freq: Three times a day (TID) | ORAL | Status: DC
  Administered 2022-08-07 – 2022-08-08 (×3): 5 mg via ORAL
  Filled 2022-08-07 (×3): qty 1

## 2022-08-07 MED ORDER — MIDODRINE HCL 5 MG PO TABS: 5.0000 mg | ORAL_TABLET | Freq: Two times a day (BID) | ORAL | Status: DC

## 2022-08-07 NOTE — Progress Notes (Signed)
Physical Therapy Treatment Patient Details Name: Briana Lozano MRN: [id number] DOB: 20-Jul-1929 Today's Date: 08/07/2022   History of Present Illness Pt is a 88 year old female with history of hypertension, hiatal hernia, hypothyroid, known giant intracranial cavernous ICA aneurysm, R eye blindness, heart murmur, vasovagal syncope, orthostatic hypotension, who presents to the emergency department for chief concerns of syncope while sitting on the toilet.    PT Comments    Patient alert, agreeable to PT, denied pain. Pt with improved alertness and tolerance today. BP taken and pt still orthostatic, RN and MD notified. She was able to perform bed mobility modI, sit <> stand from EOB and from [facility] (CGA with RW, minA from EOB due to lack of DME). Pericare with set up. She was able to ambulate in room ~28ft total with CGA and RW as well, further mobility deferred due to soft BP while standing. The patient would benefit from further skilled PT intervention to continue to progress towards goals. Recommendation remains appropriate.      Recommendations for follow up therapy are one component of a multi-disciplinary discharge planning process, led by the attending physician.  Recommendations may be updated based on patient status, additional functional criteria and insurance authorization.  Follow Up Recommendations  Home health PT     Assistance Recommended at Discharge Frequent or constant Supervision/Assistance  Patient can return home with the following A little help with bathing/dressing/bathroom;A little help with walking and/or transfers;Assistance with cooking/housework;Assist for transportation;Help with stairs or ramp for entrance   Equipment Recommendations  None recommended by PT    Recommendations for Other Services       Precautions / Restrictions Precautions Precautions: Fall Restrictions Weight Bearing Restrictions: No     Mobility  Bed Mobility Overal bed  mobility: Modified Independent                  Transfers Overall transfer level: Needs assistance Equipment used: Rolling walker (2 wheels) Transfers: Sit to/from Stand Sit to Stand: Min guard           General transfer comment: from [facility] and from EOB    Ambulation/Gait Ambulation/Gait assistance: Min guard Gait Distance (Feet): 20 Feet Assistive device: Rolling walker (2 wheels)         General Gait Details: limited due to continued low BP but pt able to ambulate from [facility] to recliner in room. no unsteadiness noted   Stairs             Wheelchair Mobility    Modified Rankin (Stroke Patients Only)       Balance Overall balance assessment: Needs assistance Sitting-balance support: Feet supported Sitting balance-Leahy Scale: Good Sitting balance - Comments: pericare in sitting with set up     Standing balance-Leahy Scale: Fair                              Cognition Arousal/Alertness: Awake/alert Behavior During Therapy: WFL for tasks assessed/performed Overall Cognitive Status: Within Functional Limits for tasks assessed                                          Exercises      General Comments        Pertinent Vitals/Pain Pain Assessment Pain Assessment: No/denies pain    Home Living  Prior Function            PT Goals (current goals can now be found in the care plan section) Progress towards PT goals: Progressing toward goals    Frequency    Min 2X/week      PT Plan Current plan remains appropriate    Co-evaluation              AM-PAC PT "6 Clicks" Mobility   Outcome Measure  Help needed turning from your back to your side while in a flat bed without using bedrails?: None Help needed moving from lying on your back to sitting on the side of a flat bed without using bedrails?: None Help needed moving to and from a bed to a chair (including a  wheelchair)?: None Help needed standing up from a chair using your arms (e.g., wheelchair or bedside chair)?: None Help needed to walk in hospital room?: A Little Help needed climbing 3-5 steps with a railing? : A Little 6 Click Score: 22    End of Session Equipment Utilized During Treatment: Gait belt Activity Tolerance: Other (comment) (limited by BP) Patient left: in chair;with call bell/phone within reach Nurse Communication: Mobility status PT Visit Diagnosis: Other abnormalities of gait and mobility (R26.89);Difficulty in walking, not elsewhere classified (R26.2);Muscle weakness (generalized) (M62.81)     Time: 8546-2703 PT Time Calculation (min) (ACUTE ONLY): 23 min  Charges:  $Therapeutic Activity: 23-37 mins                     Olga Coaster PT, DPT 3:59 PM,08/07/22

## 2022-08-07 NOTE — Plan of Care (Signed)
  Problem: Education: Goal: Knowledge of General Education information will improve Description: Including pain rating scale, medication(s)/side effects and non-pharmacologic comfort measures Outcome: Progressing   Problem: Health Behavior/Discharge Planning: Goal: Ability to manage health-related needs will improve Outcome: Progressing   Problem: Clinical Measurements: Goal: Ability to maintain clinical measurements within normal limits will improve Outcome: Progressing Goal: Will remain free from infection Outcome: Progressing Goal: Respiratory complications will improve Outcome: Not Applicable Goal: Cardiovascular complication will be avoided Outcome: Progressing   Problem: Activity: Goal: Risk for activity intolerance will decrease Outcome: Progressing   Problem: Nutrition: Goal: Adequate nutrition will be maintained Outcome: Progressing   Problem: Coping: Goal: Level of anxiety will decrease Outcome: Progressing   Problem: Elimination: Goal: Will not experience complications related to bowel motility Outcome: Progressing Goal: Will not experience complications related to urinary retention Outcome: Progressing

## 2022-08-07 NOTE — Progress Notes (Signed)
[facility]  - [place] at [facility]   PATIENT NAME: Briana Lozano    MR#:  [id number]  DATE OF BIRTH:  08-Jun-1929  SUBJECTIVE:  patient is older son at bedside. She is sitting up feels little better than yesterday. Able to eat some. Denies any nausea vomiting.    VITALS:  Blood pressure 125/63, pulse 61, temperature 97.6 F (36.4 C), temperature source Oral, resp. rate 16, height 5\' 3"  (1.6 m), weight 52.6 kg, SpO2 95 %.  PHYSICAL EXAMINATION:   GENERAL:  88 y.o.-year-old patient lying in the bed with no acute distress. Frail. Legally blind right eye LUNGS: Normal breath sounds bilaterally, no wheezing, rales, rhonchi.  CARDIOVASCULAR: S1, S2 normal. No murmurs, rubs, or gallops.  ABDOMEN: Soft, nontender, nondistended. Bowel sounds present.  EXTREMITIES: No  edema b/l.    NEUROLOGIC: nonfocal  patient is alert and awake SKIN: No obvious rash, lesion, or ulcer.   LABORATORY PANEL:  CBC Recent Labs  Lab 08/06/22 0426  WBC 7.5  HGB 14.0  HCT 40.5  PLT 214     Chemistries  Recent Labs  Lab 08/05/22 1009 08/06/22 0426  NA 136 137  K 4.8 4.2  CL 105 105  CO2 24 27  GLUCOSE 131* 93  BUN 11 12  CREATININE 0.76 0.69  CALCIUM 8.8* 9.0  AST 26  --   ALT 12  --   ALKPHOS 43  --   BILITOT 1.4*  --     Assessment and Plan  Briana Lozano is a 89 year old female with history of hypertension, hiatal hernia, hypothyroid, known giant intracranial cavernous ICA aneurysm, vasovagal syncope, orthostatic hypotension, who presents to the emergency department for chief concerns of syncope while sitting on the toilet. Patient has history of orthostatic hypotension and was admitted in October 2022 CT of the head without contrast was read as no acute finding.  Known giant cavernous ICA aneurysm on the right.   Chest x-ray 2 view: Was read as moderate hiatal hernia.  No other abnormalities.   Syncope presumed Vasovagal orthostatic hypotension (similar  admission in October 2022) -- patient exhibited sign symptoms of ortho stasis will continue IV fluids -- prn midodrine -- hold BP meds (bisoprolol) -- PT OT recommends home health -- patient continues to feel weak when working with mobility therapist and PT orthostatic drop in blood pressure. Will increase dose of midodrine.  Depression -- does not take celexa  Left eye legally blind  Hypothyroidism -- Synthroid  Nausea -- PRN Zofran -- patient able to eat some breakfast    Procedures:none Family communication : son at bedside Consults : none CODE STATUS: FULL code per son DVT Prophylaxis :lovenox Level of care: Telemetry Medical Status November 2022 Pt improving--still a bit symptomatic. Will cont IVF and monitor one nite TOTAL TIME TAKING CARE OF THIS PATIENT: 35 minutes.  >50% time spent on counselling and coordination of care  Note: This dictation was prepared with Dragon dictation along with smaller phrase technology. Any transcriptional errors that result from this process are unintentional.  YK:DXIP M.D    [facility]   CC: Primary care physician; Enedina Finner, MD

## 2022-08-07 NOTE — Progress Notes (Signed)
Mobility Specialist - Progress Note  Pre-mobility: SpO2 98% During mobility: SpO2 99% Post-mobility: Pulse 72, BP 109/57, SPO2 97%   08/07/22 1046  Orthostatic Lying   BP- Lying 99/52  Pulse- Lying 69  Orthostatic Sitting  BP- Sitting 93/52  Pulse- Sitting 70  Orthostatic Standing at 0 minutes  BP- Standing at 0 minutes (!) 70/41  Pulse- Standing at 0 minutes 79  Orthostatic Standing at 3 minutes  BP- Standing at 3 minutes (!) 78/48  Pulse- Standing at 3 minutes 88  Mobility  Activity Ambulated with assistance in hallway  Level of Assistance Contact guard assist, steadying assist  Assistive Device Front wheel walker  Distance Ambulated (ft) 40 ft  Activity Response Tolerated well  $Mobility charge 1 Mobility   Pt supine upon entry, utilizing RA. Pt orthostatics taken while supine, sitting EOB, standing and standing after 3 mins. Pt BP dropped when asked to stand from EOB, elevating some after standing for 3 mins. Pt denied feeling dizziness or nausea. Pt ambulated 20 ft in the hallway using RW with CGA. Pt voiced concern of feeling "a little fatigued". Pt returned to room, left supine with alarm set and needs in reach.   Zetta Bills Mobility Specialist 08/07/22 10:57 AM

## 2022-08-07 NOTE — TOC Initial Note (Signed)
Transition of Care [facility]) - Initial/Assessment Note    Patient Details  Name: Briana Lozano MRN: [id number] Date of Birth: [birth year]  Transition of Care [facility]) CM/SW Contact:    Candie Chroman, LCSW Phone Number: 08/07/2022, 12:13 PM  Clinical Narrative:    CSW met with patient. No supports at bedside. CSW introduced role and explained that PT recommendations would be discussed. Patient asked about SNF but CSW explained that according to PT note she does not qualify for that. She asked CSW to call her son who is on his way back from [place]. Son is agreeable to home health. Prefers last agency she worked with which appears to be [facility]. They accepted referral for PT. No DME recommendations. No further concerns. CSW encouraged patient and her son to contact CSW as needed. CSW will continue to follow patient and her son for support and facilitate return home once stable.              Expected Discharge Plan: [place] Barriers to Discharge: Continued Medical Work up   Patient Goals and CMS Choice        Expected Discharge Plan and Services Expected Discharge Plan: [place] Choice: [place] arrangements for the past 2 months: Apartment                           HH Arranged: PT HH Agency: [facility] [place] Date [place]: 08/07/22   Representative spoke with at [place]: Juanda Crumble  Prior Living Arrangements/Services Living arrangements for the past 2 months: Apartment Lives with:: Adult Children Patient language and need for interpreter reviewed:: Yes Do you feel safe going back to the place where you live?: Yes      Need for Family Participation in Patient Care: Yes (Comment) Care giver support system in place?: Yes (comment) Current home services: DME Criminal Activity/Legal Involvement Pertinent to Current Situation/Hospitalization: No - Comment as needed  Activities of Daily  Living Home Assistive Devices/Equipment: Environmental consultant (specify type), Cane (specify quad or straight), Dentures (specify type) ADL Screening (condition at time of admission) Patient's cognitive ability adequate to safely complete daily activities?: Yes Is the patient deaf or have difficulty hearing?: No Does the patient have difficulty seeing, even when wearing glasses/contacts?: No Does the patient have difficulty concentrating, remembering, or making decisions?: No Patient able to express need for assistance with ADLs?: Yes Does the patient have difficulty dressing or bathing?: Yes Independently performs ADLs?: No Communication: Independent Dressing (OT): Independent Grooming: Needs assistance Is this a change from baseline?: Pre-admission baseline Feeding: Independent Bathing: Needs assistance Is this a change from baseline?: Pre-admission baseline Toileting: Needs assistance Is this a change from baseline?: Pre-admission baseline In/Out Bed: Needs assistance Is this a change from baseline?: Pre-admission baseline Walks in Home: Needs assistance Is this a change from baseline?: Pre-admission baseline Does the patient have difficulty walking or climbing stairs?: Yes Weakness of Legs: None Weakness of Arms/Hands: None  Permission Sought/Granted Permission sought to share information with : Facility Sport and exercise psychologist, Family Supports Permission granted to share information with : Yes, Verbal Permission Granted  Share Information with NAME: Isac Sarna  Permission granted to share info w AGENCY: [place] granted to share info w Relationship: Son  Permission granted to share info w Contact Information: [phone]  Emotional Assessment Appearance:: Appears stated age Attitude/Demeanor/Rapport: Engaged, Gracious  Affect (typically observed): Accepting, Appropriate, Calm, Pleasant Orientation: : Oriented to Self, Oriented to Place, Oriented to  Time,  Oriented to Situation Alcohol / Substance Use: Not Applicable Psych Involvement: No (comment)  Admission diagnosis:  Syncope and collapse [R55] Syncope [R55] Patient Active Problem List   Diagnosis Date Noted   Syncope 08/05/2022   Blindness of left eye 08/05/2022   Nausea & vomiting 05/26/2022   Hypertensive urgency 05/26/2022   Acute lower UTI    Dysuria    Nausea and vomiting    Stroke ([place]) 09/20/2021   TIA (transient ischemic attack) 09/19/2021   Constipation 09/19/2021   Orthostatic hypotension    Numbness and tingling of right side of face    Gastroesophageal reflux disease without esophagitis    Right facial pain 09/10/2021   Weakness    Acute metabolic encephalopathy    Hyponatremia 09/09/2021   Altered mental status, unspecified 09/09/2021   Brain aneurysm    COPD (chronic obstructive pulmonary disease) ([place])    Depression    AMS (altered mental status)    Hiatal hernia    Hypokalemia    Abdominal pain 01/28/2019   Hypothyroidism 01/17/2018   Accelerated hypertension 01/17/2018   Intermediate coronary syndrome ([place]) 01/17/2018   Macular degeneration 01/17/2018   Senile osteoporosis 01/17/2018   Obstructive sleep apnea syndrome 11/10/2015   Bowel habit changes 06/16/2014   BRBPR (bright red blood per rectum) 06/16/2014   Anxiety 05/29/2014   PCP:  Juluis Pitch, MD Pharmacy:   [facility] [place] [address] Phone: [phone] Fax: [phone]     Social Determinants of Health (SDOH) Interventions    Readmission Risk Interventions     No data to display

## 2022-08-08 DIAGNOSIS — R55 Syncope and collapse: Secondary | ICD-10-CM | POA: Diagnosis not present

## 2022-08-08 MED ORDER — BISOPROLOL FUMARATE 5 MG PO TABS: 2.5000 mg | ORAL_TABLET | Freq: Every day | ORAL | 0 refills | Status: AC | PRN

## 2022-08-08 MED ORDER — MIDODRINE HCL 5 MG PO TABS: 5.0000 mg | ORAL_TABLET | Freq: Three times a day (TID) | ORAL | 0 refills | Status: DC

## 2022-08-08 NOTE — Plan of Care (Signed)
IV removed, discharge instructions reviewed, and patient discharged with family to home

## 2022-08-08 NOTE — Discharge Summary (Signed)
Physician Discharge Summary   Patient: Briana Lozano MRN: [id number] DOB: 1929/11/15  Admit date:     08/05/2022  Discharge date: 08/08/22  Discharge Physician: Fritzi Mandes   PCP: Juluis Pitch, MD   Recommendations at discharge:    Take Bisoprolol only if SBP >135 mm hg Keep log of BP at home for PCP to review Wear Compression Ted hose during daytime and remove at night Keep yourself hydrated F/u PCP in 1-2 weeks  Discharge Diagnoses: Syncope in the setting of orthostatic hypotension  Hospital Course:  Briana Lozano is a 88 year old female with history of hypertension, hiatal hernia, hypothyroid, known giant intracranial cavernous ICA aneurysm, vasovagal syncope, orthostatic hypotension, who presents to the emergency department for chief concerns of syncope while sitting on the toilet. Patient has history of orthostatic hypotension and was admitted in October 2022 CT of the head without contrast was read as no acute finding.  Known giant cavernous ICA aneurysm on the right.   Chest x-ray 2 view: Was read as moderate hiatal hernia.  No other abnormalities.   Syncope presumed Vasovagal orthostatic hypotension (similar admission in October 2022) -- patient exhibited sign symptoms of ortho stasis and recieved IV fluids -- midodrine now increased to 5 mg tid -- hold BP meds (bisoprolol) at discharge--prn parameter given -- PT OT recommends home health --ate much better today. Overall slowly improving --compression Ted hose during daytime, keep log of BP at home, keep hydrated  Depression -- pt pharmacy med rec does not take celexa   H/o Cerebral aneurysm H/o headache -- pt follows with Neurosurgery as out pt   Hypothyroidism -- Synthroid   Nausea -- PRN Zofran -- patient able to eat some breakfast  Right facial neuropathic pain On gabapentin    discharge plan ws d/w dter at bedside and she voiced understanding   Procedures:none Family communication :  dter at bedside Consults : none CODE STATUS: FULL code per son [name] DVT Prophylaxis :lovenox      Disposition: Home health Diet recommendation:  Discharge Diet Orders (From admission, onward)     Start     Ordered   08/08/22 0000  Diet - low sodium heart healthy        08/08/22 1032           Regular diet DISCHARGE MEDICATION: Allergies as of 08/08/2022       Reactions   Cortisone Swelling       Penicillins Swelling   Did it involve swelling of the face/tongue/throat, SOB, or low BP? No Did it involve sudden or severe rash/hives, skin peeling, or any reaction on the inside of your mouth or nose? No Did you need to seek medical attention at a hospital or doctor's office? No When did it last happen?      7-8 years If all above answers are "NO", may proceed with cephalosporin use.   Valium [diazepam] Other (See Comments)   Couldn't walk        Medication List     STOP taking these medications    citalopram 10 MG tablet Commonly known as: CELEXA       TAKE these medications    acetaminophen 650 MG CR tablet Commonly known as: TYLENOL Take 1,000 mg by mouth every 8 (eight) hours as needed for pain.   bisoprolol 5 MG tablet Commonly known as: ZEBETA Take 0.5 tablets (2.5 mg total) by mouth daily as needed. If systolic BP [id number] What changed:  when to take this reasons to  take this additional instructions   feeding supplement Liqd Take 237 mLs by mouth 2 (two) times daily between meals.   gabapentin 300 MG capsule Commonly known as: NEURONTIN Take 1 capsule (300 mg total) by mouth 3 (three) times daily.   levothyroxine 75 MCG tablet Commonly known as: SYNTHROID Take 75 mcg by mouth daily before breakfast.   midodrine 5 MG tablet Commonly known as: PROAMATINE Take 1 tablet (5 mg total) by mouth 3 (three) times daily with meals. What changed:  medication strength how much to take when to take this reasons to take this   pantoprazole 40 MG  tablet Commonly known as: PROTONIX Take 1 tablet (40 mg total) by mouth at bedtime.   polyethylene glycol 17 g packet Commonly known as: MIRALAX / GLYCOLAX Take 17 g by mouth daily as needed for mild constipation.   senna 8.6 MG Tabs tablet Commonly known as: SENOKOT Take 1 tablet by mouth at bedtime.   SYSTANE OP Place 1 drop into both eyes daily as needed (dry eyes).   ZyrTEC Allergy 10 MG tablet Generic drug: cetirizine Take 10 mg by mouth daily.        Follow-up Information     [facility] Follow up.   Specialty: Home Health Services Why: They will follow up with you for your home physical therapy needs. Contact information: [address] [place] [address] [phone]         Dorothey Baseman, MD. Schedule an appointment as soon as possible for a visit in 1 week(s).   Specialty: Family Medicine Why: hospital f/u Orthostatic hypotension Contact information: [address] [place] [address] [phone]                Discharge Exam: [name] Weights   08/05/22 1001  Weight: 52.6 kg     Condition at discharge: fair  The results of significant diagnostics from this hospitalization (including imaging, microbiology, ancillary and laboratory) are listed below for reference.   Imaging Studies: DG Chest 2 View  Result Date: 08/05/2022 CLINICAL DATA:  Syncope EXAM: CHEST - 2 VIEW COMPARISON:  May 26, 2022 FINDINGS: Moderate hiatal hernia. The heart, hila, mediastinum, lungs, and pleura are otherwise unremarkable. IMPRESSION: Moderate hiatal hernia.  No other abnormalities. Electronically Signed   By: Gerome Sam III M.D.   On: 08/05/2022 15:53   CT Head Wo Contrast  Result Date: 08/05/2022 CLINICAL DATA:  Syncope on toilet this morning EXAM: CT HEAD WITHOUT CONTRAST TECHNIQUE: Contiguous axial images were obtained from the base of the skull through the vertex without intravenous contrast. RADIATION DOSE REDUCTION: This  exam was performed according to the departmental dose-optimization program which includes automated exposure control, adjustment of the mA and/or kV according to patient size and/or use of iterative reconstruction technique. 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 No adjacent edema or hemorrhage. Dimensions continue to measure up to 3 cm. Mild chronic small vessel ischemia for age. No acute infarct, hydrocephalus, or collection Vascular: No hyperdense vessel or unexpected calcification. Skull: Normal. Negative for fracture or focal lesion. Sinuses/Orbits: No acute finding. IMPRESSION: 1. No acute finding. 2. Known giant cavernous ICA aneurysm on the right. Electronically Signed   By: Tiburcio Pea M.D.   On: 08/05/2022 11:07    Microbiology: Results for orders placed or performed during the hospital encounter of 09/19/21  Resp Panel  by RT-PCR (Flu A&B, Covid) Nasopharyngeal Swab     Status: None   Collection Time: 09/20/21  6:35 AM   Specimen: Nasopharyngeal Swab; Nasopharyngeal(NP) swabs in vial transport medium  Result Value Ref Range Status   SARS Coronavirus 2 by RT PCR NEGATIVE NEGATIVE Final    Comment: (NOTE) SARS-CoV-2 target nucleic acids are NOT DETECTED.  The SARS-CoV-2 RNA is generally detectable in upper respiratory specimens during the acute phase of infection. The lowest concentration of SARS-CoV-2 viral copies this assay can detect is 138 copies/mL. A negative result does not preclude SARS-Cov-2 infection and should not be used as the sole basis for treatment or other patient management decisions. A negative result may occur with  improper specimen collection/handling, submission of specimen other than nasopharyngeal swab, presence of viral mutation(s) within the areas targeted by this assay, and inadequate number of viral copies(<138  copies/mL). A negative result must be combined with clinical observations, patient history, and epidemiological information. The expected result is Negative.  Fact Sheet for Patients:  EntrepreneurPulse.com.au  Fact Sheet for Healthcare Providers:  IncredibleEmployment.be  This test is no t yet approved or cleared by the [place] FDA and  has been authorized for detection and/or diagnosis of SARS-CoV-2 by FDA under an Emergency Use Authorization (EUA). This EUA will remain  in effect (meaning this test can be used) for the duration of the COVID-19 declaration under Section 564(b)(1) of the Act, 21 U.S.C.section 360bbb-3(b)(1), unless the authorization is terminated  or revoked sooner.       Influenza A by PCR NEGATIVE NEGATIVE Final   Influenza B by PCR NEGATIVE NEGATIVE Final    Comment: (NOTE) The Xpert Xpress SARS-CoV-2/FLU/RSV plus assay is intended as an aid in the diagnosis of influenza from Nasopharyngeal swab specimens and should not be used as a sole basis for treatment. Nasal washings and aspirates are unacceptable for Xpert Xpress SARS-CoV-2/FLU/RSV testing.  Fact Sheet for Patients: EntrepreneurPulse.com.au  Fact Sheet for Healthcare Providers: IncredibleEmployment.be  This test is not yet approved or cleared by the [place] FDA and has been authorized for detection and/or diagnosis of SARS-CoV-2 by FDA under an Emergency Use Authorization (EUA). This EUA will remain in effect (meaning this test can be used) for the duration of the COVID-19 declaration under Section 564(b)(1) of the Act, 21 U.S.C. section 360bbb-3(b)(1), unless the authorization is terminated or revoked.  Performed at [facility], [address], [place], [place] [address]   Urine Culture     Status: Abnormal   Collection Time: 09/22/21  7:11 AM   Specimen: Urine, Clean Catch  Result Value Ref Range  Status   Specimen Description   Final    URINE, CLEAN CATCH Performed at [facility], [address] [place], [place] [address]    Special Requests   Final    Normal Performed at [facility], [place], [place] [address]    Culture >=100,000 COLONIES/mL ESCHERICHIA COLI (A)  Final   Report Status 09/25/2021 FINAL  Final   Organism ID, Bacteria ESCHERICHIA COLI (A)  Final      Susceptibility   Escherichia coli - MIC*    AMPICILLIN 8 SENSITIVE Sensitive     CEFAZOLIN <=4 SENSITIVE Sensitive     CEFEPIME <=0.12 SENSITIVE Sensitive     CEFTRIAXONE <=0.25 SENSITIVE Sensitive     CIPROFLOXACIN <=0.25 SENSITIVE Sensitive     GENTAMICIN <=1 SENSITIVE Sensitive     IMIPENEM <=0.25 SENSITIVE Sensitive  NITROFURANTOIN <=16 SENSITIVE Sensitive     TRIMETH/SULFA <=20 SENSITIVE Sensitive     AMPICILLIN/SULBACTAM 4 SENSITIVE Sensitive     PIP/TAZO <=4 SENSITIVE Sensitive     * >=100,000 COLONIES/mL ESCHERICHIA COLI  Resp Panel by RT-PCR (Flu A&B, Covid) Nasopharyngeal Swab     Status: None   Collection Time: 09/23/21 11:30 AM   Specimen: Nasopharyngeal Swab; Nasopharyngeal(NP) swabs in vial transport medium  Result Value Ref Range Status   SARS Coronavirus 2 by RT PCR NEGATIVE NEGATIVE Final    Comment: (NOTE) SARS-CoV-2 target nucleic acids are NOT DETECTED.  The SARS-CoV-2 RNA is generally detectable in upper respiratory specimens during the acute phase of infection. The lowest concentration of SARS-CoV-2 viral copies this assay can detect is 138 copies/mL. A negative result does not preclude SARS-Cov-2 infection and should not be used as the sole basis for treatment or other patient management decisions. A negative result may occur with  improper specimen collection/handling, submission of specimen other than nasopharyngeal swab, presence of viral mutation(s) within the areas targeted by this assay, and inadequate number of viral copies(<138  copies/mL). A negative result must be combined with clinical observations, patient history, and epidemiological information. The expected result is Negative.  Fact Sheet for Patients:  BloggerCourse.com  Fact Sheet for Healthcare Providers:  SeriousBroker.it  This test is no t yet approved or cleared by the [place] FDA and  has been authorized for detection and/or diagnosis of SARS-CoV-2 by FDA under an Emergency Use Authorization (EUA). This EUA will remain  in effect (meaning this test can be used) for the duration of the COVID-19 declaration under Section 564(b)(1) of the Act, 21 U.S.C.section 360bbb-3(b)(1), unless the authorization is terminated  or revoked sooner.       Influenza A by PCR NEGATIVE NEGATIVE Final   Influenza B by PCR NEGATIVE NEGATIVE Final    Comment: (NOTE) The Xpert Xpress SARS-CoV-2/FLU/RSV plus assay is intended as an aid in the diagnosis of influenza from Nasopharyngeal swab specimens and should not be used as a sole basis for treatment. Nasal washings and aspirates are unacceptable for Xpert Xpress SARS-CoV-2/FLU/RSV testing.  Fact Sheet for Patients: BloggerCourse.com  Fact Sheet for Healthcare Providers: SeriousBroker.it  This test is not yet approved or cleared by the [place] FDA and has been authorized for detection and/or diagnosis of SARS-CoV-2 by FDA under an Emergency Use Authorization (EUA). This EUA will remain in effect (meaning this test can be used) for the duration of the COVID-19 declaration under Section 564(b)(1) of the Act, 21 U.S.C. section 360bbb-3(b)(1), unless the authorization is terminated or revoked.  Performed at [facility], [address] [place], [place] [address]     Labs: CBC: Recent Labs  Lab 08/05/22 1009 08/06/22 0426  WBC 7.1 7.5  HGB 13.8 14.0  HCT 41.7 40.5  MCV 102.2* 100.0   PLT 199 214   Basic Metabolic Panel: Recent Labs  Lab 08/05/22 1009 08/06/22 0426  NA 136 137  K 4.8 4.2  CL 105 105  CO2 24 27  GLUCOSE 131* 93  BUN 11 12  CREATININE 0.76 0.69  CALCIUM 8.8* 9.0   Liver Function Tests: Recent Labs  Lab 08/05/22 1009  AST 26  ALT 12  ALKPHOS 43  BILITOT 1.4*  PROT 6.6  ALBUMIN 3.6   CBG: No results for input(s): "GLUCAP" in the last 168 hours.  Discharge time spent: greater than 30 minutes.  Signed: Enedina Finner, MD [facility] 08/08/2022

## 2022-08-08 NOTE — TOC Transition Note (Signed)
Transition of Care [facility]) - CM/SW Discharge Note   Patient Details  Name: Briana Lozano MRN: [id number] Date of Birth: 04-21-1929  [facility] CM/SW Contact:  Margarito Liner, LCSW Phone Number: 08/08/2022, 11:13 AM   Clinical Narrative: Patient has orders to discharge home today. Left message for [facility] representative to notify. No further concerns. CSW signing off.    Final next level of care: Home w Home Health Services Barriers to Discharge: Barriers Resolved   Patient Goals and CMS Choice     Choice offered to / list presented to : Adult Children  Discharge Placement                    Patient and family notified of of transfer: 08/08/22  Discharge Plan and Services     Post Acute Care Choice: Home Health                    HH Arranged: PT, OT [facility] Agency: [facility] Date [facility] Agency Contacted: 08/07/22   Representative spoke with at [facility] Agency: Alfonzo Beers  Social Determinants of Health (SDOH) Interventions     Readmission Risk Interventions     No data to display

## 2022-08-08 NOTE — Discharge Instructions (Signed)
Take Bisoprolol only if SBP >135 mm hg Keep log of BP at home for PCP to review Wear Compression Ted hose during daytime and remove at night Keep yoiurself hydrated

## 2024-12-30 ENCOUNTER — Emergency Department

## 2024-12-30 ENCOUNTER — Encounter: Payer: Self-pay | Admitting: Emergency Medicine

## 2024-12-30 ENCOUNTER — Inpatient Hospital Stay
Admission: EM | Admit: 2024-12-30 | Discharge: 2025-01-02 | DRG: 065 | Disposition: A | Discharge status: Skilled Nursing Facility | Attending: Internal Medicine | Admitting: Internal Medicine

## 2024-12-30 ENCOUNTER — Other Ambulatory Visit: Payer: Self-pay

## 2024-12-30 DIAGNOSIS — F32A Depression, unspecified: Secondary | ICD-10-CM | POA: Diagnosis present

## 2024-12-30 DIAGNOSIS — K219 Gastro-esophageal reflux disease without esophagitis: Secondary | ICD-10-CM | POA: Diagnosis present

## 2024-12-30 DIAGNOSIS — Z8672 Personal history of thrombophlebitis: Secondary | ICD-10-CM

## 2024-12-30 DIAGNOSIS — R29704 NIHSS score 4: Secondary | ICD-10-CM | POA: Diagnosis not present

## 2024-12-30 DIAGNOSIS — Z880 Allergy status to penicillin: Secondary | ICD-10-CM

## 2024-12-30 DIAGNOSIS — I6381 Other cerebral infarction due to occlusion or stenosis of small artery: Secondary | ICD-10-CM | POA: Diagnosis present

## 2024-12-30 DIAGNOSIS — I951 Orthostatic hypotension: Secondary | ICD-10-CM | POA: Diagnosis present

## 2024-12-30 DIAGNOSIS — I639 Cerebral infarction, unspecified: Principal | ICD-10-CM | POA: Diagnosis present

## 2024-12-30 DIAGNOSIS — G629 Polyneuropathy, unspecified: Secondary | ICD-10-CM | POA: Diagnosis present

## 2024-12-30 DIAGNOSIS — F3289 Other specified depressive episodes: Secondary | ICD-10-CM

## 2024-12-30 DIAGNOSIS — H548 Legal blindness, as defined in USA: Secondary | ICD-10-CM | POA: Diagnosis present

## 2024-12-30 DIAGNOSIS — Z7982 Long term (current) use of aspirin: Secondary | ICD-10-CM

## 2024-12-30 DIAGNOSIS — Z8419 Family history of other disorders of kidney and ureter: Secondary | ICD-10-CM

## 2024-12-30 DIAGNOSIS — I671 Cerebral aneurysm, nonruptured: Secondary | ICD-10-CM | POA: Diagnosis not present

## 2024-12-30 DIAGNOSIS — Z8673 Personal history of transient ischemic attack (TIA), and cerebral infarction without residual deficits: Secondary | ICD-10-CM

## 2024-12-30 DIAGNOSIS — R29707 NIHSS score 7: Secondary | ICD-10-CM | POA: Diagnosis present

## 2024-12-30 DIAGNOSIS — I10 Essential (primary) hypertension: Secondary | ICD-10-CM | POA: Diagnosis not present

## 2024-12-30 DIAGNOSIS — J449 Chronic obstructive pulmonary disease, unspecified: Secondary | ICD-10-CM | POA: Diagnosis present

## 2024-12-30 DIAGNOSIS — E039 Hypothyroidism, unspecified: Secondary | ICD-10-CM | POA: Diagnosis present

## 2024-12-30 DIAGNOSIS — I63531 Cerebral infarction due to unspecified occlusion or stenosis of right posterior cerebral artery: Principal | ICD-10-CM | POA: Diagnosis present

## 2024-12-30 DIAGNOSIS — G8194 Hemiplegia, unspecified affecting left nondominant side: Secondary | ICD-10-CM | POA: Diagnosis present

## 2024-12-30 DIAGNOSIS — Z801 Family history of malignant neoplasm of trachea, bronchus and lung: Secondary | ICD-10-CM

## 2024-12-30 DIAGNOSIS — M810 Age-related osteoporosis without current pathological fracture: Secondary | ICD-10-CM | POA: Diagnosis present

## 2024-12-30 DIAGNOSIS — Z96651 Presence of right artificial knee joint: Secondary | ICD-10-CM | POA: Diagnosis present

## 2024-12-30 DIAGNOSIS — Z8616 Personal history of COVID-19: Secondary | ICD-10-CM

## 2024-12-30 DIAGNOSIS — Z79890 Hormone replacement therapy (postmenopausal): Secondary | ICD-10-CM

## 2024-12-30 DIAGNOSIS — Z66 Do not resuscitate: Secondary | ICD-10-CM | POA: Diagnosis present

## 2024-12-30 LAB — DIFFERENTIAL
Abs Immature Granulocytes: 0.02 K/uL (ref 0.00–0.07)
Basophils Absolute: 0 K/uL (ref 0.0–0.1)
Basophils Relative: 0 %
Eosinophils Absolute: 0.2 K/uL (ref 0.0–0.5)
Eosinophils Relative: 3 %
Immature Granulocytes: 0 %
Lymphocytes Relative: 32 %
Lymphs Abs: 1.8 K/uL (ref 0.7–4.0)
Monocytes Absolute: 0.6 K/uL (ref 0.1–1.0)
Monocytes Relative: 11 %
Neutro Abs: 2.9 K/uL (ref 1.7–7.7)
Neutrophils Relative %: 54 %

## 2024-12-30 LAB — COMPREHENSIVE METABOLIC PANEL WITH GFR
ALT: 7 U/L (ref 0–44)
AST: 19 U/L (ref 15–41)
Albumin: 3.7 g/dL (ref 3.5–5.0)
Alkaline Phosphatase: 37 U/L — ABNORMAL LOW (ref 38–126)
Anion gap: 6 (ref 5–15)
BUN: 13 mg/dL (ref 8–23)
CO2: 29 mmol/L (ref 22–32)
Calcium: 9.1 mg/dL (ref 8.9–10.3)
Chloride: 102 mmol/L (ref 98–111)
Creatinine, Ser: 0.71 mg/dL (ref 0.44–1.00)
GFR, Estimated: >60 mL/min
Glucose, Bld: 90 mg/dL (ref 70–99)
Potassium: 4 mmol/L (ref 3.5–5.1)
Sodium: 138 mmol/L (ref 135–145)
Total Bilirubin: 0.4 mg/dL (ref 0.0–1.2)
Total Protein: 6.1 g/dL — ABNORMAL LOW (ref 6.5–8.1)

## 2024-12-30 LAB — CBC
HCT: 39.7 % (ref 36.0–46.0)
Hemoglobin: 13.2 g/dL (ref 12.0–15.0)
MCH: 34.3 pg — ABNORMAL HIGH (ref 26.0–34.0)
MCHC: 33.2 g/dL (ref 30.0–36.0)
MCV: 103.1 fL — ABNORMAL HIGH (ref 80.0–100.0)
Platelets: 182 K/uL (ref 150–400)
RBC: 3.85 MIL/uL — ABNORMAL LOW (ref 3.87–5.11)
RDW: 13.5 % (ref 11.5–15.5)
WBC: 5.5 K/uL (ref 4.0–10.5)
nRBC: 0 % (ref 0.0–0.2)

## 2024-12-30 LAB — APTT: aPTT: 30 s (ref 24–36)

## 2024-12-30 LAB — PROTIME-INR
INR: 1 (ref 0.8–1.2)
Prothrombin Time: 13.8 s (ref 11.4–15.2)

## 2024-12-30 MED ORDER — GABAPENTIN 300 MG PO CAPS
300.0000 mg | ORAL_CAPSULE | Freq: Three times a day (TID) | ORAL | Status: DC
  Administered 2024-12-30 – 2025-01-02 (×9): 300 mg via ORAL
  Filled 2024-12-30 (×9): qty 1

## 2024-12-30 MED ORDER — STROKE: EARLY STAGES OF RECOVERY BOOK: Freq: Once | Status: AC

## 2024-12-30 MED ORDER — PANTOPRAZOLE SODIUM 40 MG PO TBEC
40.0000 mg | DELAYED_RELEASE_TABLET | Freq: Every day | ORAL | Status: DC
  Administered 2024-12-30 – 2025-01-01 (×3): 40 mg via ORAL
  Filled 2024-12-30 (×3): qty 1

## 2024-12-30 MED ORDER — LEVOTHYROXINE SODIUM 50 MCG PO TABS
75.0000 ug | ORAL_TABLET | Freq: Every day | ORAL | Status: DC
  Administered 2024-12-31 – 2025-01-02 (×3): 75 ug via ORAL
  Filled 2024-12-30 (×3): qty 1

## 2024-12-30 MED ORDER — LORATADINE 10 MG PO TABS
10.0000 mg | ORAL_TABLET | Freq: Every day | ORAL | Status: DC
  Administered 2024-12-30 – 2025-01-02 (×4): 10 mg via ORAL
  Filled 2024-12-30 (×4): qty 1

## 2024-12-30 MED ORDER — BISOPROLOL FUMARATE 5 MG PO TABS
2.5000 mg | ORAL_TABLET | Freq: Every day | ORAL | Status: DC
  Administered 2024-12-30 – 2025-01-02 (×4): 2.5 mg via ORAL
  Filled 2024-12-30 (×4): qty 0.5

## 2024-12-30 MED ORDER — LABETALOL HCL 5 MG/ML IV SOLN
5.0000 mg | Freq: Once | INTRAVENOUS | Status: AC
  Administered 2024-12-30: 5 mg via INTRAVENOUS

## 2024-12-30 MED ORDER — ENSURE ENLIVE PO LIQD
237.0000 mL | Freq: Two times a day (BID) | ORAL | Status: DC
  Administered 2024-12-31 – 2025-01-02 (×4): 237 mL via ORAL
  Filled 2024-12-30: qty 237

## 2024-12-30 MED ORDER — SENNA 8.6 MG PO TABS
1.0000 | ORAL_TABLET | Freq: Every day | ORAL | Status: DC
  Administered 2024-12-30 – 2025-01-01 (×3): 8.6 mg via ORAL
  Filled 2024-12-30 (×3): qty 1

## 2024-12-30 MED ORDER — LABETALOL HCL 5 MG/ML IV SOLN
5.0000 mg | INTRAVENOUS | Status: DC | PRN
  Administered 2024-12-30: 5 mg via INTRAVENOUS
  Filled 2024-12-30: qty 4

## 2024-12-30 MED ORDER — CITALOPRAM HYDROBROMIDE 20 MG PO TABS
10.0000 mg | ORAL_TABLET | Freq: Every day | ORAL | Status: DC
  Administered 2024-12-31 – 2025-01-02 (×3): 10 mg via ORAL
  Filled 2024-12-30 (×3): qty 1

## 2024-12-30 MED ORDER — AMLODIPINE BESYLATE 5 MG PO TABS
5.0000 mg | ORAL_TABLET | Freq: Every day | ORAL | Status: DC
  Administered 2024-12-31 – 2025-01-02 (×3): 5 mg via ORAL
  Filled 2024-12-30 (×3): qty 1

## 2024-12-30 MED ORDER — ASPIRIN 81 MG PO TBEC
81.0000 mg | DELAYED_RELEASE_TABLET | Freq: Every day | ORAL | Status: DC
  Administered 2024-12-30 – 2025-01-02 (×4): 81 mg via ORAL
  Filled 2024-12-30 (×4): qty 1

## 2024-12-30 MED ORDER — CLOPIDOGREL BISULFATE 75 MG PO TABS
75.0000 mg | ORAL_TABLET | Freq: Every day | ORAL | Status: DC
  Administered 2024-12-30 – 2025-01-02 (×4): 75 mg via ORAL
  Filled 2024-12-30 (×4): qty 1

## 2024-12-30 MED ORDER — AMLODIPINE BESYLATE 5 MG PO TABS
5.0000 mg | ORAL_TABLET | Freq: Once | ORAL | Status: AC
  Administered 2024-12-30: 5 mg via ORAL
  Filled 2024-12-30: qty 1

## 2024-12-30 MED ORDER — LABETALOL HCL 5 MG/ML IV SOLN
10.0000 mg | INTRAVENOUS | Status: DC | PRN
  Filled 2024-12-30: qty 4

## 2024-12-30 MED ORDER — IOHEXOL 350 MG/ML SOLN
75.0000 mL | Freq: Once | INTRAVENOUS | Status: AC | PRN
  Administered 2024-12-30: 75 mL via INTRAVENOUS

## 2024-12-30 MED ORDER — POLYETHYLENE GLYCOL 3350 17 G PO PACK
17.0000 g | PACK | Freq: Every day | ORAL | Status: DC | PRN
  Filled 2024-12-30: qty 1

## 2024-12-30 MED ORDER — POLYETHYLENE GLYCOL 3350 17 G PO PACK
17.0000 g | PACK | Freq: Every day | ORAL | Status: DC | PRN
  Administered 2024-12-31 – 2025-01-02 (×2): 17 g via ORAL
  Filled 2024-12-30: qty 1

## 2024-12-30 NOTE — ED Notes (Signed)
 Patient's put on bedpan. Patient pericare provided, as well as full linen bed change and put in hospital gown. Stretcher locked and in the lowest position.

## 2024-12-30 NOTE — Assessment & Plan Note (Addendum)
 One of the aneurysms is larger than previous.  Will have neurology evaluation.  Has been seen by neurosurgery in the past.

## 2024-12-30 NOTE — Assessment & Plan Note (Signed)
 On Protonix 

## 2024-12-30 NOTE — Assessment & Plan Note (Signed)
 On Celexa 

## 2024-12-30 NOTE — Progress Notes (Signed)
 BP higher when she got on the floor.  Zebeta  not given in ER.  Gave norvasc  and 5mg  iv labetalol .  BP 208/99. Will give another 5mg  labetalol  and zebeta  also. For accelerated hypertension.  This patient has a history of orthostatic hypotension, so will need to check orthostatics too. Patient took midodrine  this am.  Continue to hold.  Continue cardiac monitoring  Dr Charlie Patterson 20 minutes

## 2024-12-30 NOTE — ED Notes (Signed)
 Patient transported to MRI

## 2024-12-30 NOTE — Assessment & Plan Note (Signed)
 On gabapentin 

## 2024-12-30 NOTE — ED Notes (Signed)
 Called CCMD and added pt to board

## 2024-12-30 NOTE — ED Triage Notes (Signed)
 Pt to ER from home via EMS with c/o left sided weakness for last 2 days, getting worse today.  Pt has hx of TIA and thought symptoms would resolve.  Pt with noted weakness to left, also has altered gait.  Arrives alert and oriented.

## 2024-12-30 NOTE — H&P (Signed)
 " History and Physical    Patient: Briana Lozano FMW:[id number] DOB: [birth year] DOA: 12/30/2024 DOS: the patient was seen and examined on 12/30/2024 PCP: Glover Lenis, MD  Patient coming from: Home  Chief Complaint:  Chief Complaint  Patient presents with   Weakness   HPI: Briana Lozano is a 89 y.o. female with medical history significant of brain aneurysms.  She presents with left-sided weakness with dragging her left foot and no use of her finger since Thursday.  Patient states this happened before and she did not want to come into the ER because she thought it would get better.  She did not want to come to the ER on the weekend.  She came in today and a CT angiogram redemonstrates a large aneurysm originating from the right cavernous ICA extending into the right middle cranial fossa measuring 3 x 2.9 x 3 cm and the contrast portion appears increased in size.  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  MRI of the brain shows small acute infarcts in the posterior right corona radiata and basal ganglia.  Hospitalist services contacted for admission. Review of Systems: Review of Systems  Constitutional:  Positive for malaise/fatigue. Negative for diaphoresis and fever.  HENT:  Negative for hearing loss.   Eyes:  Negative for blurred vision.  Respiratory:  Negative for cough and shortness of breath.   Cardiovascular:  Negative for chest pain.  Gastrointestinal:  Positive for nausea. Negative for abdominal pain, constipation, diarrhea, heartburn and vomiting.  Genitourinary:  Negative for dysuria.  Musculoskeletal:  Negative for myalgias.  Skin:  Negative for rash.  Neurological:  Negative for dizziness.  Endo/Heme/Allergies:  Does not bruise/bleed easily.  Psychiatric/Behavioral:  Positive for depression.     Past Medical History:  Diagnosis Date   Arthritis    fingers   Brain aneurysm    COPD (chronic obstructive pulmonary disease)  (HCC)    wheezing occasionally   COVID-19 12/19/2019   mild symptoms   Depression    Dyspnea    with exertion   GERD (gastroesophageal reflux disease)    Headache    Heart murmur    followed by PCP   History of hiatal hernia    History of phlebitis    Hypertension    Hypothyroidism    Osteoporosis    Pneumonia    in PAST   Sleep apnea    no CPAP   Vertigo    in past   Wears dentures    full upper   Past Surgical History:  Procedure Laterality Date   APPENDECTOMY     BREAST BIOPSY     CATARACT EXTRACTION W/PHACO Left 11/18/2019   Procedure: CATARACT EXTRACTION PHACO AND INTRAOCULAR LENS PLACEMENT (IOC) LEFT 5.77, 00:41.2;  Surgeon: Jaye Fallow, MD;  Location: [facility];  Service: Ophthalmology;  Laterality: Left;  sleep apnea   CATARACT EXTRACTION W/PHACO Right 01/20/2020   Procedure: CATARACT EXTRACTION PHACO AND INTRAOCULAR LENS PLACEMENT (IOC) RIGHT;  Surgeon: Jaye Fallow, MD;  Location: [facility];  Service: Ophthalmology;  Laterality: Right;  Lot #[id number] H US : 07:12 CDE: 00:59.3    COLONOSCOPY  2002,2008,2015   EYE SURGERY     HEMORRHOID SURGERY  1950   IR ANGIO INTRA EXTRACRAN SEL COM CAROTID INNOMINATE BILAT MOD SED  04/17/2017   IR ANGIO VERTEBRAL SEL VERTEBRAL UNI L MOD SED  04/17/2017   REPLACEMENT TOTAL KNEE Right 04/2005   Social History:  reports that she has never  smoked. She has never used smokeless tobacco. She reports that she does not drink alcohol  and does not use drugs.  Allergies[1]  Family History  Problem Relation Age of Onset   Kidney disease Mother    Lung cancer Father    Lung cancer Brother    Breast cancer Neg Hx     Prior to Admission medications  Medication Sig Start Date End Date Taking? Authorizing Provider  acetaminophen  (TYLENOL ) 650 MG CR tablet Take 1,000 mg by mouth every 8 (eight) hours as needed for pain.    [provider]  bisoprolol  (ZEBETA ) 5 MG tablet Take 0.5 tablets (2.5 mg total) by mouth  daily as needed. If systolic BP >135 1/70/76   Tobie Calix, MD  feeding supplement (ENSURE ENLIVE / ENSURE PLUS) LIQD Take 237 mLs by mouth 2 (two) times daily between meals. 09/23/21   Josette Ade, MD  gabapentin  (NEURONTIN ) 300 MG capsule Take 1 capsule (300 mg total) by mouth 3 (three) times daily. 09/28/21   Josette Ade, MD  levothyroxine  (SYNTHROID ) 75 MCG tablet Take 75 mcg by mouth daily before breakfast.    [provider]  midodrine  (PROAMATINE ) 5 MG tablet Take 1 tablet (5 mg total) by mouth 3 (three) times daily with meals. 08/08/22   Patel, Sona, MD  pantoprazole  (PROTONIX ) 40 MG tablet Take 1 tablet (40 mg total) by mouth at bedtime. 09/23/21   Josette Ade, MD  Polyethyl Glycol-Propyl Glycol (SYSTANE OP) Place 1 drop into both eyes daily as needed (dry eyes).     [provider]  polyethylene glycol (MIRALAX  / GLYCOLAX ) packet Take 17 g by mouth daily as needed for mild constipation.     [provider]  senna (SENOKOT) 8.6 MG TABS tablet Take 1 tablet by mouth at bedtime.    [provider]  ZYRTEC ALLERGY 10 MG tablet Take 10 mg by mouth daily. 04/12/22   [provider]    Physical Exam: Vitals:   12/30/24 1043 12/30/24 1155 12/30/24 1434 12/30/24 1458  BP:  (!) 155/77  (!) 165/86  Pulse:  (!) 56  (!) 53  Resp:  18  20  Temp:   98 F (36.7 C)   TempSrc:   Oral   SpO2: 100% 96%  96%  Weight:      Height:       Physical Exam HENT:     Head: Normocephalic.  Eyes:     General: Lids are normal.     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  Able to move left eye up and down and side-to-side.  Cardiovascular:     Rate and Rhythm: Normal rate and regular rhythm.     Heart sounds: S1 normal and S2 normal. Murmur heard.     Systolic murmur is present with a grade of 2/6.  Pulmonary:     Breath sounds: No decreased breath sounds, wheezing, rhonchi or rales.  Abdominal:     Palpations:  Abdomen is soft.     Tenderness: There is no abdominal tenderness.  Musculoskeletal:     Right lower leg: No swelling.     Left lower leg: No swelling.  Skin:    General: Skin is warm.     Findings: No rash.  Neurological:     Mental Status: She is alert and oriented to person, place, and time.     Comments: Left hand difficulty moving fingers.  Poor grip strength left hand.  Able  to lift up her left arm and pull with her arm towards her face and pushed away.  Able to straight leg raise.  Weakness on flexing and extending at the ankle.     Data Reviewed: CT angio and MRI reviewed above. Sodium 138, potassium 4.0, chloride 102, CO2 29, close BUN 13 creatinine 0.71, liver function test normal range, white blood cell count 5.5, hemoglobin 13.2, platelet count 39.7, platelet count 182 Chest x-ray shows moderate hiatal hernia and no other abnormality  Assessment and Plan: * Acute stroke due to ischemia [facility]) Patient already takes aspirin  will add Plavix .  Checking lipid profile and likely will need a statin.  Neurology consultation.  PT and OT consultations.  Echocardiogram ordered.  Patient with weakness left hand and left foot.  Cerebral aneurysm without rupture One of the aneurysms is larger than previous.  Will have neurology evaluation.  Has been seen by neurosurgery in the past.  Essential hypertension Patient on bisoprolol  as needed at home will give this daily and monitor blood pressure.  Since stroke happened likely on Thursday would like to have better blood pressure.  Patient with history of orthostatic hypotension will hold midodrine .  And check orthostatics.  Polyneuropathy On gabapentin   Orthostatic hypotension Hold midodrine  for right now.  Check orthostatics.  GERD (gastroesophageal reflux disease) On Protonix   Depression On Celexa   Hypothyroidism, unspecified On levothyroxine       Advance Care Planning:   Code Status: Limited: Do not attempt resuscitation  (DNR) -DNR-LIMITED -Do Not Intubate/DNI    Consults: Neurology  Family Communication: Spoke with sitter at the bedside her friend and son on the phone.  Severity of Illness: The appropriate patient status for this patient is OBSERVATION. Observation status is judged to be reasonable and necessary in order to provide the required intensity of service to ensure the patient's safety. The patient's presenting symptoms, physical exam findings, and initial radiographic and laboratory data in the context of their medical condition is felt to place them at decreased risk for further clinical deterioration. Furthermore, it is anticipated that the patient will be medically stable for discharge from the hospital within 2 midnights of admission.   Author: Charlie Patterson, MD 12/30/2024 5:34 PM  For on call review [email or web address].     [1]  Allergies Allergen Reactions   Cortisone Swelling        Penicillins Swelling    Did it involve swelling of the face/tongue/throat, SOB, or low BP? No Did it involve sudden or severe rash/hives, skin peeling, or any reaction on the inside of your mouth or nose? No Did you need to seek medical attention at a hospital or doctor's office? No When did it last happen?      7-8 years If all above answers are NO, may proceed with cephalosporin use.    Valium [Diazepam] Other (See Comments)    Couldn't walk   "

## 2024-12-30 NOTE — ED Provider Notes (Signed)
 "  [facility] Provider Note    Event Date/Time   First MD Initiated Contact with Patient 12/30/24 1029     (approximate)  History   Chief Complaint: Weakness  HPI  Briana Lozano is a 89 y.o. female with a past medical history of arthritis, brain aneurysm (untreated per patient), COPD, gastric reflux, hypertension, presents to the emergency department for left-sided weakness.  According to the patient for the past 2 to 3 days now she has been experiencing weakness of the left upper and lower extremities.  Patient states she thought it would go away as she has had TIA type symptoms in the past but it has not in fact she feels like it has gotten worse.  Patient finally came to the emergency department for evaluation thinking that she may have had a stroke.  Patient denies any blood thinners.  Physical Exam   Triage Vital Signs: ED Triage Vitals  Encounter Vitals Group     BP 12/30/24 1033 (!) 181/73     Girls Systolic BP Percentile --      Girls Diastolic BP Percentile --      Boys Systolic BP Percentile --      Boys Diastolic BP Percentile --      Pulse Rate 12/30/24 1033 (!) 59     Resp 12/30/24 1033 18     Temp 12/30/24 1033 97.8 F (36.6 C)     Temp Source 12/30/24 1033 Oral     SpO2 12/30/24 1033 97 %     Weight 12/30/24 1029 115 lb (52.2 kg)     Height 12/30/24 1029 5' 3 (1.6 m)     Head Circumference --      Peak Flow --      Pain Score --      Pain Loc --      Pain Education --      Exclude from Growth Chart --     Most recent vital signs: Vitals:   12/30/24 1033 12/30/24 1043  BP: (!) 181/73   Pulse: (!) 59   Resp: 18   Temp: 97.8 F (36.6 C)   SpO2: 97% 100%    General: Awake, no distress.  CV:  Good peripheral perfusion.  Regular rate and rhythm  Resp:  Normal effort.  Equal breath sounds bilaterally.  Abd:  No distention.  Soft, nontender.  Other:  No obvious cranial nerve deficits, patient has longstanding right eye  deficits at baseline.  Patient has significant diminished grip strength in the left upper extremity compared to the right upper extremity when she is not able to lift the left upper extremity completely off the bed.  Patient has great strength in the right lower extremity however she is able to lift the left upper extremity but it falls back to the bed after approximately 3 to 4 seconds.  ED Results / Procedures / Treatments   EKG  EKG viewed and interpreted by myself shows a sinus rhythm at 60 bpm with a narrow QRS, normal axis, PR prolongation otherwise normal intervals with nonspecific ST changes.  RADIOLOGY  I have reviewed and interpreted CT angio images of the head.  Patient has a large right sided aneurysm but I do not appreciate any obvious significant bleed. Radiology has read the CT scan is consistent with enlarged aneurysm no active bleed.  MEDICATIONS ORDERED IN ED: Medications - No data to display   IMPRESSION / MDM / ASSESSMENT AND PLAN / ED COURSE  I reviewed the triage vital signs and the nursing notes.  Patient's presentation is most consistent with acute presentation with potential threat to life or bodily function.  Patient presents to the emergency department for left-sided weakness.  Symptoms have been ongoing for 2 to 3 days per patient.  Patient has significant deficits of the left upper and left lower extremities compared to the right side.  Patient states these deficits are new.  Patient has good speech no obvious aphasia.  No obvious cranial nerve deficits.  Highly suspect CVA.  Given the patient's history of an aneurysm we will obtain CT imaging of the head first noncontrasted then with IV contrast of the head and the neck.  Patient will likely require admission to the hospital service given her left-sided weakness.  Patient remains very sharp mentally.  CT scan is negative for bleed does show an enlarging right sided aneurysm.  Lab work is reassuring with a normal  CBC normal chemistry.  However given the patient's significant symptoms we will proceed with an MRI highly suspect CVA.  Patient care signed out to oncoming provider MRI pending.  FINAL CLINICAL IMPRESSION(S) / ED DIAGNOSES   Left-sided weakness CVA   Note:  This document was prepared using Dragon voice recognition software and may include unintentional dictation errors.   Dorothyann Drivers, MD 01/03/25 1358  "

## 2024-12-30 NOTE — Assessment & Plan Note (Signed)
 Patient on bisoprolol  as needed at home will give this daily and monitor blood pressure.  Since stroke happened likely on Thursday would like to have better blood pressure.  Patient with history of orthostatic hypotension will hold midodrine .  And check orthostatics.

## 2024-12-30 NOTE — Assessment & Plan Note (Signed)
 Hold midodrine  for right now.  Check orthostatics.

## 2024-12-30 NOTE — Assessment & Plan Note (Addendum)
 Patient already takes aspirin  will add Plavix .  Checking lipid profile and likely will need a statin.  Neurology consultation.  PT and OT consultations.  Echocardiogram ordered.  Patient with weakness left hand and left foot.

## 2024-12-30 NOTE — Assessment & Plan Note (Signed)
 On levothyroxine

## 2024-12-30 NOTE — ED Provider Notes (Signed)
" °  Physical Exam  BP (!) 165/86   Pulse (!) 53   Temp 98 F (36.7 C) (Oral)   Resp 20   Ht 5' 3 (1.6 m)   Wt 52.2 kg   SpO2 96%   BMI 20.37 kg/m   Physical Exam  Procedures  Procedures  ED Course / MDM    Medical Decision Making Amount and/or Complexity of Data Reviewed Labs: ordered. Radiology: ordered.  Risk Prescription drug management. Decision regarding hospitalization.   This patient's care was signed out to me at shift change pending completion of MRI.  Patient initially presented complaining of 2 to 3 days of left-sided weakness.  MRI does show acute infarction.  Findings were discussed with the patient and family.  Discussed plan for admission and family is agreeable.  I discussed patient's case with the hospitalist who is in agreement with plan for admission at this time.  Neurology was made aware of the patient as well.       Rexford Reche HERO, MD 12/30/24 1631  "

## 2024-12-30 NOTE — ED Notes (Signed)
 Patient transported to CT

## 2024-12-31 ENCOUNTER — Observation Stay: Admit: 2024-12-31 | Discharge: 2024-12-31 | Disposition: A | Attending: Internal Medicine | Admitting: Internal Medicine

## 2024-12-31 DIAGNOSIS — G8194 Hemiplegia, unspecified affecting left nondominant side: Secondary | ICD-10-CM | POA: Diagnosis present

## 2024-12-31 DIAGNOSIS — I10 Essential (primary) hypertension: Secondary | ICD-10-CM | POA: Diagnosis present

## 2024-12-31 DIAGNOSIS — Z79890 Hormone replacement therapy (postmenopausal): Secondary | ICD-10-CM | POA: Diagnosis not present

## 2024-12-31 DIAGNOSIS — Z8672 Personal history of thrombophlebitis: Secondary | ICD-10-CM | POA: Diagnosis not present

## 2024-12-31 DIAGNOSIS — R29704 NIHSS score 4: Secondary | ICD-10-CM | POA: Diagnosis not present

## 2024-12-31 DIAGNOSIS — E039 Hypothyroidism, unspecified: Secondary | ICD-10-CM | POA: Diagnosis present

## 2024-12-31 DIAGNOSIS — K219 Gastro-esophageal reflux disease without esophagitis: Secondary | ICD-10-CM | POA: Diagnosis present

## 2024-12-31 DIAGNOSIS — Z880 Allergy status to penicillin: Secondary | ICD-10-CM | POA: Diagnosis not present

## 2024-12-31 DIAGNOSIS — M810 Age-related osteoporosis without current pathological fracture: Secondary | ICD-10-CM | POA: Diagnosis present

## 2024-12-31 DIAGNOSIS — H548 Legal blindness, as defined in USA: Secondary | ICD-10-CM | POA: Diagnosis present

## 2024-12-31 DIAGNOSIS — I639 Cerebral infarction, unspecified: Secondary | ICD-10-CM | POA: Diagnosis present

## 2024-12-31 DIAGNOSIS — Z8616 Personal history of COVID-19: Secondary | ICD-10-CM | POA: Diagnosis not present

## 2024-12-31 DIAGNOSIS — G629 Polyneuropathy, unspecified: Secondary | ICD-10-CM | POA: Diagnosis present

## 2024-12-31 DIAGNOSIS — Z96651 Presence of right artificial knee joint: Secondary | ICD-10-CM | POA: Diagnosis present

## 2024-12-31 DIAGNOSIS — Z7982 Long term (current) use of aspirin: Secondary | ICD-10-CM | POA: Diagnosis not present

## 2024-12-31 DIAGNOSIS — Z801 Family history of malignant neoplasm of trachea, bronchus and lung: Secondary | ICD-10-CM | POA: Diagnosis not present

## 2024-12-31 DIAGNOSIS — Z8673 Personal history of transient ischemic attack (TIA), and cerebral infarction without residual deficits: Secondary | ICD-10-CM | POA: Diagnosis not present

## 2024-12-31 DIAGNOSIS — R29707 NIHSS score 7: Secondary | ICD-10-CM | POA: Diagnosis present

## 2024-12-31 DIAGNOSIS — J449 Chronic obstructive pulmonary disease, unspecified: Secondary | ICD-10-CM | POA: Diagnosis present

## 2024-12-31 DIAGNOSIS — Z8419 Family history of other disorders of kidney and ureter: Secondary | ICD-10-CM | POA: Diagnosis not present

## 2024-12-31 DIAGNOSIS — I63531 Cerebral infarction due to unspecified occlusion or stenosis of right posterior cerebral artery: Secondary | ICD-10-CM | POA: Diagnosis present

## 2024-12-31 DIAGNOSIS — F32A Depression, unspecified: Secondary | ICD-10-CM | POA: Diagnosis present

## 2024-12-31 DIAGNOSIS — I951 Orthostatic hypotension: Secondary | ICD-10-CM | POA: Diagnosis present

## 2024-12-31 DIAGNOSIS — I6381 Other cerebral infarction due to occlusion or stenosis of small artery: Secondary | ICD-10-CM

## 2024-12-31 DIAGNOSIS — Z66 Do not resuscitate: Secondary | ICD-10-CM | POA: Diagnosis present

## 2024-12-31 LAB — ECHOCARDIOGRAM COMPLETE
Area-P 1/2: 2.33 cm2
Height: 63 in
P 1/2 time: 842 ms
S' Lateral: 2 cm
Weight: 1840 [oz_av]

## 2024-12-31 LAB — BASIC METABOLIC PANEL WITH GFR
Anion gap: 10 (ref 5–15)
BUN: 10 mg/dL (ref 8–23)
CO2: 26 mmol/L (ref 22–32)
Calcium: 9.1 mg/dL (ref 8.9–10.3)
Chloride: 101 mmol/L (ref 98–111)
Creatinine, Ser: 0.61 mg/dL (ref 0.44–1.00)
GFR, Estimated: >60 mL/min
Glucose, Bld: 87 mg/dL (ref 70–99)
Potassium: 3.9 mmol/L (ref 3.5–5.1)
Sodium: 136 mmol/L (ref 135–145)

## 2024-12-31 LAB — HEMOGLOBIN A1C
Hgb A1c MFr Bld: 5.5 % (ref 4.8–5.6)
Mean Plasma Glucose: 111.15 mg/dL

## 2024-12-31 LAB — LIPID PANEL
Cholesterol: 174 mg/dL (ref 0–200)
HDL: 41 mg/dL
LDL Cholesterol: 115 mg/dL — ABNORMAL HIGH (ref 0–99)
Total CHOL/HDL Ratio: 4.3 ratio
Triglycerides: 93 mg/dL
VLDL: 19 mg/dL (ref 0–40)

## 2024-12-31 LAB — CBC
HCT: 38.3 % (ref 36.0–46.0)
Hemoglobin: 13.1 g/dL (ref 12.0–15.0)
MCH: 34.2 pg — ABNORMAL HIGH (ref 26.0–34.0)
MCHC: 34.2 g/dL (ref 30.0–36.0)
MCV: 100 fL (ref 80.0–100.0)
Platelets: 192 K/uL (ref 150–400)
RBC: 3.83 MIL/uL — ABNORMAL LOW (ref 3.87–5.11)
RDW: 13.2 % (ref 11.5–15.5)
WBC: 7.4 K/uL (ref 4.0–10.5)
nRBC: 0 % (ref 0.0–0.2)

## 2024-12-31 MED ORDER — ENOXAPARIN SODIUM 40 MG/0.4ML IJ SOSY
40.0000 mg | PREFILLED_SYRINGE | INTRAMUSCULAR | Status: DC
  Administered 2024-12-31 – 2025-01-01 (×2): 40 mg via SUBCUTANEOUS
  Filled 2024-12-31 (×2): qty 0.4

## 2024-12-31 MED ORDER — CYANOCOBALAMIN 1000 MCG/ML IJ SOLN
1000.0000 ug | INTRAMUSCULAR | Status: DC
  Administered 2024-12-31: 1000 ug via INTRAMUSCULAR
  Filled 2024-12-31: qty 1

## 2024-12-31 MED ORDER — ATORVASTATIN CALCIUM 20 MG PO TABS
40.0000 mg | ORAL_TABLET | Freq: Every day | ORAL | Status: DC
  Administered 2024-12-31 – 2025-01-02 (×3): 40 mg via ORAL
  Filled 2024-12-31 (×3): qty 2

## 2024-12-31 MED ORDER — PREGABALIN 25 MG PO CAPS
25.0000 mg | ORAL_CAPSULE | Freq: Two times a day (BID) | ORAL | Status: DC
  Administered 2024-12-31 – 2025-01-02 (×5): 25 mg via ORAL
  Filled 2024-12-31 (×5): qty 1

## 2024-12-31 MED ORDER — MIDODRINE HCL 5 MG PO TABS
2.5000 mg | ORAL_TABLET | Freq: Three times a day (TID) | ORAL | Status: DC
  Administered 2024-12-31 – 2025-01-02 (×3): 2.5 mg via ORAL
  Filled 2024-12-31 (×3): qty 1

## 2024-12-31 NOTE — Plan of Care (Signed)
" °  Problem: Coping: Goal: Will identify appropriate support needs Outcome: Progressing   Problem: Self-Care: Goal: Ability to communicate needs accurately will improve Outcome: Progressing   Problem: Nutrition: Goal: Risk of aspiration will decrease Outcome: Progressing   Problem: Clinical Measurements: Goal: Ability to maintain clinical measurements within normal limits will improve Outcome: Progressing   "

## 2024-12-31 NOTE — Progress Notes (Signed)
 [facility]  - [place] at [facility]   PATIENT NAME: Briana Lozano    MR#:  [id number]  DATE OF BIRTH:  04-22-1929  SUBJECTIVE:  son and patient's friend at bedside. Came in with weakness on the left lower extremity. Patient overall feels better. PT OT to see. Patient tolerating PO diet   VITALS:  Blood pressure (!) 118/58, pulse 71, temperature 98.1 F (36.7 C), resp. rate 18, height 5' 3 (1.6 m), weight 52.2 kg, SpO2 94%.  PHYSICAL EXAMINATION:   GENERAL:  89 y.o.-year-old patient with no acute distress.  LUNGS: Normal breath sounds bilaterally, no wheezing CARDIOVASCULAR: S1, S2 normal. No murmur   ABDOMEN: Soft, nontender, nondistended. Bowel sounds present.  EXTREMITIES: No  edema b/l.    NEUROLOGIC: patient is alert and awake left lower extremity mild weakness.[place] left hand grip Right eye-legally blind   LABORATORY PANEL:  CBC Recent Labs  Lab 12/31/24 0450  WBC 7.4  HGB 13.1  HCT 38.3  PLT 192    Chemistries  Recent Labs  Lab 12/30/24 1040 12/31/24 0450  NA 138 136  K 4.0 3.9  CL 102 101  CO2 29 26  GLUCOSE 90 87  BUN 13 10  CREATININE 0.71 0.61  CALCIUM  9.1 9.1  AST 19  --   ALT 7  --   ALKPHOS 37*  --   BILITOT 0.4  --    Cardiac Enzymes No results for input(s): TROPONINI in the last 168 hours. RADIOLOGY:  MR BRAIN WO CONTRAST Result Date: 12/30/2024 EXAM: MRI BRAIN WITHOUT CONTRAST 12/30/2024 03:00:03 PM TECHNIQUE: Multiplanar multisequence MRI of the head/brain was performed without the administration of intravenous contrast. COMPARISON: CTA head and neck 12/30/2024 and MRI head 09/19/2021. CLINICAL HISTORY: Neuro deficit, acute, stroke suspected. FINDINGS: BRAIN AND VENTRICLES: Clustered small acute infarcts involve the posterior right corona radiata and basal ganglia. There are 2 chronic microhemorrhages in the pons which are new from 2022. Patchy to confluent T2 hyperintensities in the cerebral white matter, deep gray  nuclei, and brainstem have progressed from the prior MRI and are nonspecific but compatible with moderate to severe extensive chronic small vessel ischemic disease. There is mild cerebral atrophy. Major intracranial arterial flow voids are preserved. A giant right ICA aneurysm is again noted, more fully evaluated on the recent CTA. Bilateral cataract extraction. ORBITS: Bilateral cataract extraction. SINUSES AND MASTOIDS: Mild mucosal thickening in the paranasal sinuses. Clear mastoid air cells. BONES AND SOFT TISSUES: Normal marrow signal. No soft tissue abnormality. IMPRESSION: 1. Small acute infarcts in the posterior right corona radiata and basal ganglia. 2. Moderate to severe extensive chronic small vessel ischemic disease. 3. Known giant right ICA aneurysm. Electronically signed by: Dasie Hamburg MD 12/30/2024 03:58 PM EST RP Workstation: [id number]   CT ANGIO HEAD NECK W WO CM Result Date: 12/30/2024 EXAM: CTA HEAD AND NECK WITH AND WITHOUT 12/30/2024 11:41:52 AM TECHNIQUE: CTA of the head and neck was performed with and without the administration of 75 mL of iohexol  (OMNIPAQUE ) 350 MG/ML injection. Multiplanar 2D and/or 3D reformatted images are provided for review. Automated exposure control, iterative reconstruction, and/or weight based adjustment of the mA/kV was utilized to reduce the radiation dose to as low as reasonably achievable. Stenosis of the internal carotid arteries measured using NASCET criteria. COMPARISON: CTA head 06/25/2022 and MRI head 09/19/2021. CLINICAL HISTORY: Neuro deficit, acute, stroke suspected. FINDINGS: CTA NECK: AORTIC ARCH AND ARCH VESSELS: Mild atherosclerosis of the aortic arch. Mild atherosclerosis along the left subclavian artery  without significant stenosis. The left vertebral artery originates on the aortic arch. No dissection or arterial injury. No significant stenosis of the brachiocephalic arteries. CERVICAL CAROTID ARTERIES: Atherosclerosis of the right carotid  bifurcation without hemodynamically significant stenosis. Partial retropharyngeal course of the proximal right cervical ICA. Atherosclerosis at the left carotid bifurcation without hemodynamically significant stenosis. Redemonstrated multifocal irregularity with areas of subtle narrowing and outpouching of the distal left cervical ICA likely related to fibromuscular dysplasia. No dissection or arterial injury. CERVICAL VERTEBRAL ARTERIES: Tortuosity of the V1 segment of the right vertebral artery. There is irregularity and subtle narrowing of the right V1 segment which may be related to fibromuscular dysplasia. There is a 5 x 4 mm outpouching along the V1 segment of the left vertebral artery concerning for aneurysm (series 11 image 171, series 10 image 232). No dissection or arterial injury. LUNGS AND MEDIASTINUM: Unremarkable. SOFT TISSUES: Left thyroid lobe nodule measuring up to 1.0 cm. No acute abnormality. BONES: Degenerative changes in the visualized spine. Edentulous maxilla. No acute abnormality. CTA HEAD: ANTERIOR CIRCULATION: Redemonstrated slightly hyperattenuating aneurysm sac in the right middle cranial fossa which measures 3.0 x 2.9 x 3.0 cm (previously measuring 2.5 x 3.0 x 2.6 cm). There is associated remodeling of bone along the floor of the right middle cranial fossa. Enhancing portion of the aneurysm sac measures 2.4 x 2.5 x 1.6 cm. The inferior portion of the aneurysm is nonenhancing similar to the prior study suggestive of partial thrombosis. The aneurysm arises from the right cavernous ICA. Similar mass effect on the right lateral rectus with associated esotropia. There is a wide aneurysm neck measuring at least 8 mm in length. There is an additional aneurysm along the right supraclinoid ICA which measures 5 x 4 mm, similar to prior. Atherosclerosis of the carotid siphons without high grade stenosis. There is early bifurcation versus duplication of the left M1 segment. Mild narrowing of  multiple M2 branches of the left MCA. No significant stenosis of the anterior cerebral arteries. POSTERIOR CIRCULATION: Fetal origin of the right PCA. No significant stenosis of the posterior cerebral arteries. No significant stenosis of the basilar artery. No significant stenosis of the vertebral arteries. No aneurysm. OTHER: There are similar chronic microvascular ischemic changes and remote lacunar infarcts in the bilateral basal ganglia which appear new since the 2023 study. Mild parenchymal volume loss. Replacement mucosal thickening in the right sphenoid sinus findings suggestive of chronic sphenoid sinusitis. There is a spot rounded focus of soft tissue within the right sphenoid sinus which is lower to prior and could reflect prominent secretions versus fungal mycetoma. No dural venous sinus thrombosis on this non-dedicated study. IMPRESSION: 1. Redemonstrated large aneurysm originating from the right cavernous ICA extending into the right middle cranial fossa, measuring 3.0 x 2.9 x 3.0 cm, previously 2.5 x 3.0 x 2.6 cm. The contrast filling portion of the aneurysm appears increased in size. Additional non-filling portion of the aneurysm sac inferiorly, likely reflects partial thrombosis. 2. 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 3. Outpouching along the V1 segment of the left vertebral artery, measuring 5 x 4 mm, concerning for aneurysm. 4. Multifocal irregularity with areas of subtle narrowing and outpouching of the distal left cervical ICA and V1 segments of the vertebral arteries, likely related to fibromuscular dysplasia. 5. Findings suggestive of chronic sphenoid sinusitis with a rounded focus of soft tissue within the right sphenoid sinus, which could reflect prominent secretions versus fungal mycetoma. Electronically signed  by: Donnice Mania MD 12/30/2024 12:43 PM EST RP Workstation: [id number]    Assessment and Plan   From  H&P Briana Lozano is a 89 y.o. female with medical history significant of brain aneurysms.  She presents with left-sided weakness with dragging her left foot and no use of her finger since Thursday.  Patient states this happened before and she did not want to come into the ER because she thought it would get better.  She did not want to come to the ER on the weekend.  She came in today and a CT angiogram redemonstrates a large aneurysm originating from the right cavernous ICA extending into the right middle cranial fossa measuring 3 x 2.9 x 3 cm and the contrast portion appears increased in size.  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  MRI of the brain shows small acute infarcts in the posterior right corona radiata and basal ganglia.  Acute stroke due to ischemia [facility]) Patient already takes aspirin  will add Plavix .   Neurology consultation with dr Germaine appreciated.  PT and OT consultations.  Echocardiogram ordered.  Patient with weakness left hand and left foot. --in d/w Dr Marchelle ASA +Plavix  and then drop plavix  after 21 days and cont ASA alone --start statins --PT/OT to see pt   Cerebral aneurysm without rupture One of the aneurysms is larger than previous Has been seen by neurosurgery in the past Pt had decided against any intervention for it in the past   Essential hypertension --BP overall ok. BP meds resumed   Polyneuropathy On gabapentin    Orthostatic hypotension Once med rec is done will resume midodrine    GERD (gastroesophageal reflux disease) On Protonix    Depression On Celexa    Hypothyroidism, unspecified On levothyroxine    Procedures: Family communication :son at bedside Consults : neurology CODE STATUS: DNR DVT Prophylaxis : Lovenox  Level of care: Med-Surg Status is: Observation The patient remains OBS appropriate and will d/c before 2 midnights.    TOTAL TIME TAKING CARE OF THIS PATIENT: 40 minutes.   >50% time spent on counselling and coordination of care  Note: This dictation was prepared with Dragon dictation along with smaller phrase technology. Any transcriptional errors that result from this process are unintentional.  Leita Blanch M.D    [facility]   CC: Primary care physician; Glover Lenis, MD

## 2024-12-31 NOTE — Evaluation (Signed)
 Occupational Therapy Evaluation Patient Details Name: Briana Lozano MRN: [id number] DOB: 06/28/1929 Today's Date: 12/31/2024   History of Present Illness   Briana Lozano is a 89 y.o. female with medical history significant of brain aneurysms.  She presents with left-sided weakness with dragging her left foot and no use of her finger since Thursday. CT angiogram redemonstrates a large aneurysm originating from the right cavernous ICA extending into the right middle cranial fossa measuring 3 x 2.9 x 3 cm and the contrast portion appears increased in size.  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  MRI of the brain shows small acute infarcts in the posterior right corona radiata and basal ganglia.     Clinical Impressions Patient was seen for OT evaluation this date. Prior to hospital admission, patient was living with son, managing ADLs without A, son manages IADLs, ambulating in the home with rollator. Patient presents in bed and agreeable to OT evaluation, AxO x4, bed mobility to EOB with min A, noted right/posterior lean while EOB with fair corrections with multimodal cues; SPT from EOB to recliner without AD (long standing RUE impairment RTC and gross weakness to LUE). Patient reports needing to have BM, SPT from recliner to [facility] performed; patietn had large BM and then reports I'm going to pass out. PT present at this time to A with SPT from [facility] to bed with max A; returned to supine and placed in trendelenburg with patient more alert and conversive at this time. RN present at this time. Patient presents with deficits in sitting balance, gross strength and overall activity tolerance, affecting safe and optimal ADL completion. Patient is currently requiring max A for ADLs.  Paient would benefit from skilled OT services to address noted impairments and functional limitations (see below for any additional details) in order to maximize safety and  independence while minimizing future risk of falls, injury, and readmission. Anticipate the need for follow up OT services upon acute hospital DC.      If plan is discharge home, recommend the following:   A lot of help with walking and/or transfers;A lot of help with bathing/dressing/bathroom;Assistance with cooking/housework;Direct supervision/assist for medications management;Direct supervision/assist for financial management;Help with stairs or ramp for entrance     Functional Status Assessment   Patient has had a recent decline in their functional status and demonstrates the ability to make significant improvements in function in a reasonable and predictable amount of time.     Equipment Recommendations   None recommended by OT     Recommendations for Other Services         Precautions/Restrictions   Precautions Precautions: Fall Recall of Precautions/Restrictions: Intact Restrictions Weight Bearing Restrictions Per Provider Order: No     Mobility Bed Mobility Overal bed mobility: Needs Assistance Bed Mobility: Supine to Sit     Supine to sit: Min assist     General bed mobility comments: increased time required    Transfers Overall transfer level: Needs assistance Equipment used: 1 person hand held assist Transfers: Bed to chair/wheelchair/BSC       Step pivot transfers: Mod assist     General transfer comment: lifting A required, A to weight shift to initate step with LLE      Balance Overall balance assessment: Needs assistance Sitting-balance support: Bilateral upper extremity supported, Feet supported Sitting balance-Leahy Scale: Fair   Postural control: Posterior lean, Right lateral lean Standing balance support: During functional activity, Reliant on assistive device for  balance Standing balance-Leahy Scale: Poor                             ADL either performed or assessed with clinical judgement   ADL Overall ADL's :  Needs assistance/impaired                                       General ADL Comments: anticipate max A for ADLs due to hemiparesis on LUE, limited mobillity to RUE from RTC tear, poor dynamic sitting balance     Vision         Perception         Praxis         Pertinent Vitals/Pain Pain Assessment Pain Assessment: No/denies pain     Extremity/Trunk Assessment Upper Extremity Assessment Upper Extremity Assessment: RUE deficits/detail;LUE deficits/detail RUE Deficits / Details: long hx of RTC tear, coritsone injection last week LUE Deficits / Details: 2+ elbow flexion, shoulder flexion LUE Sensation: decreased light touch LUE Coordination: decreased fine motor   Lower Extremity Assessment Lower Extremity Assessment: Defer to PT evaluation   Cervical / Trunk Assessment Cervical / Trunk Assessment: Kyphotic   Communication Communication Communication: No apparent difficulties   Cognition Arousal: Alert Behavior During Therapy: WFL for tasks assessed/performed Cognition: No apparent impairments                               Following commands: Intact       Cueing  General Comments   Cueing Techniques: Verbal cues      Exercises     Shoulder Instructions      Home Living Family/patient expects to be discharged to:: Private residence Living Arrangements: Children Available Help at Discharge: Available PRN/intermittently;Family Type of Home: House Home Access: Stairs to enter [employer] of Steps: 3 Entrance Stairs-Rails: Can reach both Home Layout: One level     Bathroom Shower/Tub: Producer, Television/film/video: Handicapped height Bathroom Accessibility: Yes How Accessible: Accessible via walker Home Equipment: Rolling Walker (2 wheels);BSC/3in1;Cane - single point;Wheelchair - manual;Grab bars - toilet;Grab bars - tub/shower;Shower seat - built in;Rollator (4 wheels)          Prior  Functioning/Environment Prior Level of Function : Independent/Modified Independent;History of Falls (last six months)             Mobility Comments: ambualtes in the home with rollator, cane for doctors appointments ADLs Comments: able to manage basic self care tasks without A; son manages IADLs    OT Problem List: Decreased strength;Decreased range of motion;Decreased activity tolerance;Impaired balance (sitting and/or standing);Impaired vision/perception;Decreased coordination;Decreased safety awareness;Decreased knowledge of use of DME or AE;Impaired sensation;Impaired UE functional use   OT Treatment/Interventions: Self-care/ADL training;Therapeutic exercise;Neuromuscular education;Energy conservation;DME and/or AE instruction;Manual therapy;Therapeutic activities;Visual/perceptual remediation/compensation;Patient/family education;Balance training      OT Goals(Current goals can be found in the care plan section)   Acute Rehab OT Goals Patient Stated Goal: to get stronger OT Goal Formulation: With patient Time For Goal Achievement: 01/14/25 Potential to Achieve Goals: Good ADL Goals Pt Will Perform Grooming: with set-up;sitting Pt Will Perform Lower Body Dressing: with min assist;sit to/from stand Pt Will Transfer to Toilet: with min assist;bedside commode;ambulating Pt Will Perform Toileting - Clothing Manipulation and hygiene: with min assist;sit to/from stand   OT Frequency:  Min  3X/week    Co-evaluation              AM-PAC OT 6 Clicks Daily Activity     Outcome Measure Help from another person eating meals?: A Little Help from another person taking care of personal grooming?: A Lot Help from another person toileting, which includes using toliet, bedpan, or urinal?: A Lot Help from another person bathing (including washing, rinsing, drying)?: A Lot Help from another person to put on and taking off regular upper body clothing?: A Lot Help from another person to  put on and taking off regular lower body clothing?: A Lot 6 Click Score: 13   End of Session Equipment Utilized During Treatment: Gait belt Nurse Communication: Mobility status  Activity Tolerance: Patient limited by fatigue Patient left: in bed;with family/visitor present;with nursing/sitter in room  OT Visit Diagnosis: Unsteadiness on feet (R26.81);Other abnormalities of gait and mobility (R26.89);Muscle weakness (generalized) (M62.81);Hemiplegia and hemiparesis Hemiplegia - Right/Left: Left Hemiplegia - dominant/non-dominant: Non-Dominant Hemiplegia - caused by: Cerebral infarction                Time: 1132-1216 OT Time Calculation (min): 44 min Charges:  OT General Charges $OT Visit: 1 Visit OT Evaluation $OT Eval Low Complexity: 1 Low OT Treatments $Self Care/Home Management : 23-37 mins  Rogers Clause, OT/L MS[facility], 12/31/2024

## 2024-12-31 NOTE — Consult Note (Signed)
 Requesting Physician: Tobie    Chief Complaint: Left sided weakness  I have been asked by Dr. Tobie to see this patient in consultation for acute infarct.  HPI: Briana Lozano is an 89 y.o. female with medical history significant of brain aneurysms, HTN, OSA and COPD.  She presented with left-sided weakness with dragging of her left foot and no use of her fingers on the left since Thursday.  Patient states this happened before and she did not want to come into the ER because she thought it would get better. With no improvement presented for evaluation.  Date last known well: 12/25/2024 Time last known well: Unable to determine tPA Given: No: Outside time window Thrombectomy Candidate: No, outside time window  Past Medical History:  Diagnosis Date   Arthritis    fingers   Brain aneurysm    COPD (chronic obstructive pulmonary disease) (HCC)    wheezing occasionally   COVID-19 12/19/2019   mild symptoms   Depression    Dyspnea    with exertion   GERD (gastroesophageal reflux disease)    Headache    Heart murmur    followed by PCP   History of hiatal hernia    History of phlebitis    Hypertension    Hypothyroidism    Osteoporosis    Pneumonia    in PAST   Sleep apnea    no CPAP   Vertigo    in past   Wears dentures    full upper    Past Surgical History:  Procedure Laterality Date   APPENDECTOMY     BREAST BIOPSY     CATARACT EXTRACTION W/PHACO Left 11/18/2019   Procedure: CATARACT EXTRACTION PHACO AND INTRAOCULAR LENS PLACEMENT (IOC) LEFT 5.77, 00:41.2;  Surgeon: Jaye Fallow, MD;  Location: [facility];  Service: Ophthalmology;  Laterality: Left;  sleep apnea   CATARACT EXTRACTION W/PHACO Right 01/20/2020   Procedure: CATARACT EXTRACTION PHACO AND INTRAOCULAR LENS PLACEMENT (IOC) RIGHT;  Surgeon: Jaye Fallow, MD;  Location: [facility];  Service: Ophthalmology;  Laterality: Right;  Lot #[id number] H US : 07:12 CDE: 00:59.3    COLONOSCOPY   2002,2008,2015   EYE SURGERY     HEMORRHOID SURGERY  1950   IR ANGIO INTRA EXTRACRAN SEL COM CAROTID INNOMINATE BILAT MOD SED  04/17/2017   IR ANGIO VERTEBRAL SEL VERTEBRAL UNI L MOD SED  04/17/2017   REPLACEMENT TOTAL KNEE Right 04/2005    Family History  Problem Relation Age of Onset   Kidney disease Mother    Lung cancer Father    Lung cancer Brother    Breast cancer Neg Hx    Social History:  reports that she has never smoked. She has never used smokeless tobacco. She reports that she does not drink alcohol  and does not use drugs.  Allergies: Allergies[1]  Medications:  Prior to Admission medications  Medication Sig Start Date End Date Taking? Authorizing Provider  acetaminophen  (TYLENOL ) 650 MG CR tablet Take 1,000 mg by mouth every 8 (eight) hours as needed for pain.    [provider]  bisoprolol  (ZEBETA ) 5 MG tablet Take 0.5 tablets (2.5 mg total) by mouth daily as needed. If systolic BP >135 1/70/76   Tobie Calix, MD  feeding supplement (ENSURE ENLIVE / ENSURE PLUS) LIQD Take 237 mLs by mouth 2 (two) times daily between meals. 09/23/21   Josette Ade, MD  gabapentin  (NEURONTIN ) 300 MG capsule Take 1 capsule (300 mg total) by mouth 3 (three) times daily. 09/28/21  Josette Ade, MD  levothyroxine  (SYNTHROID ) 75 MCG tablet Take 75 mcg by mouth daily before breakfast.    [provider]  midodrine  (PROAMATINE ) 5 MG tablet Take 1 tablet (5 mg total) by mouth 3 (three) times daily with meals. 08/08/22   Patel, Sona, MD  pantoprazole  (PROTONIX ) 40 MG tablet Take 1 tablet (40 mg total) by mouth at bedtime. 09/23/21   Josette Ade, MD  Polyethyl Glycol-Propyl Glycol (SYSTANE OP) Place 1 drop into both eyes daily as needed (dry eyes).     [provider]  polyethylene glycol (MIRALAX  / GLYCOLAX ) packet Take 17 g by mouth daily as needed for mild constipation.     [provider]  senna (SENOKOT) 8.6 MG TABS tablet Take 1 tablet by mouth at  bedtime.    [provider]  ZYRTEC ALLERGY 10 MG tablet Take 10 mg by mouth daily. 04/12/22   [provider]     ROS: History obtained from the patient  General ROS: negative for - chills, fatigue, fever, night sweats, weight gain or weight loss Psychological ROS: negative for - behavioral disorder, hallucinations, memory difficulties, mood swings or suicidal ideation Ophthalmic ROS: blind in right eye ENT ROS: negative for - epistaxis, nasal discharge, oral lesions, sore throat, tinnitus or vertigo Allergy and Immunology ROS: negative for - hives or itchy/watery eyes Hematological and Lymphatic ROS: negative for - bleeding problems, bruising or swollen lymph nodes Endocrine ROS: negative for - galactorrhea, hair pattern changes, polydipsia/polyuria or temperature intolerance Respiratory ROS: negative for - cough, hemoptysis, shortness of breath or wheezing Cardiovascular ROS: negative for - chest pain, dyspnea on exertion, edema or irregular heartbeat Gastrointestinal ROS: negative for - abdominal pain, diarrhea, hematemesis, nausea/vomiting or stool incontinence Genito-Urinary ROS: negative for - dysuria, hematuria, incontinence or urinary frequency/urgency Musculoskeletal ROS: shoulder pain Neurological ROS: as noted in HPI Dermatological ROS: negative for rash and skin lesion changes   Physical Examination: Blood pressure (!) 118/58, pulse 71, temperature 98.1 F (36.7 C), resp. rate 18, height 5' 3 (1.6 m), weight 52.2 kg, SpO2 94%.  HEENT-  Normocephalic, no lesions, without obvious abnormality.  Normal external eye and conjunctiva.  Normal external ears. Normal external nose, mucus membranes and septum. Cardiovascular- Single S1, S2 Lungs- CTA Abdomen- soft, non-tender; bowel sounds normal; no masses,  no organomegaly Extremities- no edema Musculoskeletal-right shoulder limitation in ROM Skin-warm and dry, no hyperpigmentation, vitiligo, or suspicious  lesions  Neurological Examination   Mental Status: Alert, oriented, thought content appropriate.  Speech fluent without evidence of aphasia.  Able to follow 3 step commands without difficulty. Cranial Nerves: II: Right eye blindness III,IV, VI: Right esotropia.  EOMs intact in the left eye V,VII: mild left facial droop, facial light touch sensation normal bilaterally VIII: hearing normal bilaterally XI: bilateral shoulder shrug XII: midline tongue extension Motor: Right : Upper extremity   5/5    Left:     Upper extremity   4-/5  Lower extremity   5/5     Lower extremity   5-/5 Tone and bulk:normal tone throughout; no atrophy noted Sensory: Pinprick and light touch intact throughout, bilaterally Deep Tendon Reflexes: Increased in the LLE Plantars: Right: absent   Left: upgoing Cerebellar: normal finger-to-nose testing bilaterally Gait: not tested due to safety concerns     Laboratory Studies:  Basic Metabolic Panel: Recent Labs  Lab 12/30/24 1040 12/31/24 0450  NA 138 136  K 4.0 3.9  CL 102 101  CO2 29 26  GLUCOSE 90  87  BUN 13 10  CREATININE 0.71 0.61  CALCIUM  9.1 9.1    Liver Function Tests: Recent Labs  Lab 12/30/24 1040  AST 19  ALT 7  ALKPHOS 37*  BILITOT 0.4  PROT 6.1*  ALBUMIN 3.7   No results for input(s): LIPASE, AMYLASE in the last 168 hours. No results for input(s): AMMONIA in the last 168 hours.  CBC: Recent Labs  Lab 12/30/24 1040 12/31/24 0450  WBC 5.5 7.4  NEUTROABS 2.9  --   HGB 13.2 13.1  HCT 39.7 38.3  MCV 103.1* 100.0  PLT 182 192    Cardiac Enzymes: No results for input(s): CKTOTAL, CKMB, CKMBINDEX, TROPONINI in the last 168 hours.  BNP: Invalid input(s): POCBNP  CBG: No results for input(s): GLUCAP in the last 168 hours.  Microbiology: Results for orders placed or performed during the hospital encounter of 09/19/21  Resp Panel by RT-PCR (Flu A&B, Covid) Nasopharyngeal Swab     Status: None    Collection Time: 09/20/21  6:35 AM   Specimen: Nasopharyngeal Swab; Nasopharyngeal(NP) swabs in vial transport medium  Result Value Ref Range Status   SARS Coronavirus 2 by RT PCR NEGATIVE NEGATIVE Final    Comment: (NOTE) SARS-CoV-2 target nucleic acids are NOT DETECTED.  The SARS-CoV-2 RNA is generally detectable in upper respiratory specimens during the acute phase of infection. The lowest concentration of SARS-CoV-2 viral copies this assay can detect is 138 copies/mL. A negative result does not preclude SARS-Cov-2 infection and should not be used as the sole basis for treatment or other patient management decisions. A negative result may occur with  improper specimen collection/handling, submission of specimen other than nasopharyngeal swab, presence of viral mutation(s) within the areas targeted by this assay, and inadequate number of viral copies(<138 copies/mL). A negative result must be combined with clinical observations, patient history, and epidemiological information. The expected result is Negative.  Fact Sheet for Patients:  bloggercourse.com  Fact Sheet for Healthcare Providers:  seriousbroker.it  This test is no t yet approved or cleared by the [place]  FDA and  has been authorized for detection and/or diagnosis of SARS-CoV-2 by FDA under an Emergency Use Authorization (EUA). This EUA will remain  in effect (meaning this test can be used) for the duration of the COVID-19 declaration under Section 564(b)(1) of the Act, 21 U.S.C.section 360bbb-3(b)(1), unless the authorization is terminated  or revoked sooner.       Influenza A by PCR NEGATIVE NEGATIVE Final   Influenza B by PCR NEGATIVE NEGATIVE Final    Comment: (NOTE) The Xpert Xpress SARS-CoV-2/FLU/RSV plus assay is intended as an aid in the diagnosis of influenza from Nasopharyngeal swab specimens and should not be used as a sole basis for treatment.  Nasal washings and aspirates are unacceptable for Xpert Xpress SARS-CoV-2/FLU/RSV testing.  Fact Sheet for Patients: bloggercourse.com  Fact Sheet for Healthcare Providers: seriousbroker.it  This test is not yet approved or cleared by the [place]  FDA and has been authorized for detection and/or diagnosis of SARS-CoV-2 by FDA under an Emergency Use Authorization (EUA). This EUA will remain in effect (meaning this test can be used) for the duration of the COVID-19 declaration under Section 564(b)(1) of the Act, 21 U.S.C. section 360bbb-3(b)(1), unless the authorization is terminated or revoked.  Performed at [facility], [address] [place], [place] [address]   Urine Culture     Status: Abnormal   Collection Time: 09/22/21  7:11 AM   Specimen: Urine, Clean Catch  Result Value Ref Range Status   Specimen Description   Final    URINE, CLEAN CATCH Performed at [facility], [address] [place], [place] [address]    Special Requests   Final    Normal Performed at [facility], [address] [place], [place] [address]    Culture >=100,000 COLONIES/mL ESCHERICHIA COLI (A)  Final   Report Status 09/25/2021 FINAL  Final   Organism ID, Bacteria ESCHERICHIA COLI (A)  Final      Susceptibility   Escherichia coli - MIC*    AMPICILLIN 8 SENSITIVE Sensitive     CEFAZOLIN <=4 SENSITIVE Sensitive     CEFEPIME <=0.12 SENSITIVE Sensitive     CEFTRIAXONE <=0.25 SENSITIVE Sensitive     CIPROFLOXACIN <=0.25 SENSITIVE Sensitive     GENTAMICIN <=1 SENSITIVE Sensitive     IMIPENEM <=0.25 SENSITIVE Sensitive     NITROFURANTOIN <=16 SENSITIVE Sensitive     TRIMETH /SULFA  <=20 SENSITIVE Sensitive     AMPICILLIN/SULBACTAM 4 SENSITIVE Sensitive     PIP/TAZO <=4 SENSITIVE Sensitive     * >=100,000 COLONIES/mL ESCHERICHIA COLI  Resp Panel by RT-PCR (Flu A&B, Covid) Nasopharyngeal Swab     Status: None    Collection Time: 09/23/21 11:30 AM   Specimen: Nasopharyngeal Swab; Nasopharyngeal(NP) swabs in vial transport medium  Result Value Ref Range Status   SARS Coronavirus 2 by RT PCR NEGATIVE NEGATIVE Final    Comment: (NOTE) SARS-CoV-2 target nucleic acids are NOT DETECTED.  The SARS-CoV-2 RNA is generally detectable in upper respiratory specimens during the acute phase of infection. The lowest concentration of SARS-CoV-2 viral copies this assay can detect is 138 copies/mL. A negative result does not preclude SARS-Cov-2 infection and should not be used as the sole basis for treatment or other patient management decisions. A negative result may occur with  improper specimen collection/handling, submission of specimen other than nasopharyngeal swab, presence of viral mutation(s) within the areas targeted by this assay, and inadequate number of viral copies(<138 copies/mL). A negative result must be combined with clinical observations, patient history, and epidemiological information. The expected result is Negative.  Fact Sheet for Patients:  bloggercourse.com  Fact Sheet for Healthcare Providers:  seriousbroker.it  This test is no t yet approved or cleared by the [place]  FDA and  has been authorized for detection and/or diagnosis of SARS-CoV-2 by FDA under an Emergency Use Authorization (EUA). This EUA will remain  in effect (meaning this test can be used) for the duration of the COVID-19 declaration under Section 564(b)(1) of the Act, 21 U.S.C.section 360bbb-3(b)(1), unless the authorization is terminated  or revoked sooner.       Influenza A by PCR NEGATIVE NEGATIVE Final   Influenza B by PCR NEGATIVE NEGATIVE Final    Comment: (NOTE) The Xpert Xpress SARS-CoV-2/FLU/RSV plus assay is intended as an aid in the diagnosis of influenza from Nasopharyngeal swab specimens and should not be used as a sole basis for treatment.  Nasal washings and aspirates are unacceptable for Xpert Xpress SARS-CoV-2/FLU/RSV testing.  Fact Sheet for Patients: bloggercourse.com  Fact Sheet for Healthcare Providers: seriousbroker.it  This test is not yet approved or cleared by the [place]  FDA and has been authorized for detection and/or diagnosis of SARS-CoV-2 by FDA under an Emergency Use Authorization (EUA). This EUA will remain in effect (meaning this test can be used) for the duration of the COVID-19 declaration under Section 564(b)(1) of the Act, 21 U.S.C. section 360bbb-3(b)(1), unless the authorization is terminated  or revoked.  Performed at [facility], [address] [place], [place] [address]     Coagulation Studies: Recent Labs    12/30/24 1040  LABPROT 13.8  INR 1.0    Urinalysis: No results for input(s): COLORURINE, LABSPEC, PHURINE, GLUCOSEU, HGBUR, BILIRUBINUR, KETONESUR, PROTEINUR, UROBILINOGEN, NITRITE, LEUKOCYTESUR in the last 168 hours.  Invalid input(s): APPERANCEUR  Lipid Panel:    Component Value Date/Time   CHOL 174 12/31/2024 0450   TRIG 93 12/31/2024 0450   HDL 41 12/31/2024 0450   CHOLHDL 4.3 12/31/2024 0450   VLDL 19 12/31/2024 0450   LDLCALC 115 (H) 12/31/2024 0450    HgbA1C:  Lab Results  Component Value Date   HGBA1C 5.5 12/30/2024    Urine Drug Screen:      Component Value Date/Time   LABOPIA NONE DETECTED 09/09/2021 0344   COCAINSCRNUR NONE DETECTED 09/09/2021 0344   LABBENZ NONE DETECTED 09/09/2021 0344   AMPHETMU NONE DETECTED 09/09/2021 0344   THCU NONE DETECTED 09/09/2021 0344   LABBARB NONE DETECTED 09/09/2021 0344    Alcohol  Level: No results for input(s): ETH in the last 168 hours.  Other results: EKG: normal or ectopic sinus rhythm at 60 bpm.  Imaging: MR BRAIN WO CONTRAST Result Date: 12/30/2024 EXAM: MRI BRAIN WITHOUT CONTRAST 12/30/2024 03:00:03 PM  TECHNIQUE: Multiplanar multisequence MRI of the head/brain was performed without the administration of intravenous contrast. COMPARISON: CTA head and neck 12/30/2024 and MRI head 09/19/2021. CLINICAL HISTORY: Neuro deficit, acute, stroke suspected. FINDINGS: BRAIN AND VENTRICLES: Clustered small acute infarcts involve the posterior right corona radiata and basal ganglia. There are 2 chronic microhemorrhages in the pons which are new from 2022. Patchy to confluent T2 hyperintensities in the cerebral white matter, deep gray nuclei, and brainstem have progressed from the prior MRI and are nonspecific but compatible with moderate to severe extensive chronic small vessel ischemic disease. There is mild cerebral atrophy. Major intracranial arterial flow voids are preserved. A giant right ICA aneurysm is again noted, more fully evaluated on the recent CTA. Bilateral cataract extraction. ORBITS: Bilateral cataract extraction. SINUSES AND MASTOIDS: Mild mucosal thickening in the paranasal sinuses. Clear mastoid air cells. BONES AND SOFT TISSUES: Normal marrow signal. No soft tissue abnormality. IMPRESSION: 1. Small acute infarcts in the posterior right corona radiata and basal ganglia. 2. Moderate to severe extensive chronic small vessel ischemic disease. 3. Known giant right ICA aneurysm. Electronically signed by: Dasie Hamburg MD 12/30/2024 03:58 PM EST RP Workstation: [id number]   CT ANGIO HEAD NECK W WO CM Result Date: 12/30/2024 EXAM: CTA HEAD AND NECK WITH AND WITHOUT 12/30/2024 11:41:52 AM TECHNIQUE: CTA of the head and neck was performed with and without the administration of 75 mL of iohexol  (OMNIPAQUE ) 350 MG/ML injection. Multiplanar 2D and/or 3D reformatted images are provided for review. Automated exposure control, iterative reconstruction, and/or weight based adjustment of the mA/kV was utilized to reduce the radiation dose to as low as reasonably achievable. Stenosis of the internal carotid arteries measured  using NASCET criteria. COMPARISON: CTA head 06/25/2022 and MRI head 09/19/2021. CLINICAL HISTORY: Neuro deficit, acute, stroke suspected. FINDINGS: CTA NECK: AORTIC ARCH AND ARCH VESSELS: Mild atherosclerosis of the aortic arch. Mild atherosclerosis along the left subclavian artery without significant stenosis. The left vertebral artery originates on the aortic arch. No dissection or arterial injury. No significant stenosis of the brachiocephalic arteries. CERVICAL CAROTID ARTERIES: Atherosclerosis of the right carotid bifurcation without hemodynamically significant stenosis. Partial retropharyngeal course of the proximal right cervical ICA. Atherosclerosis  at the left carotid bifurcation without hemodynamically significant stenosis. Redemonstrated multifocal irregularity with areas of subtle narrowing and outpouching of the distal left cervical ICA likely related to fibromuscular dysplasia. No dissection or arterial injury. CERVICAL VERTEBRAL ARTERIES: Tortuosity of the V1 segment of the right vertebral artery. There is irregularity and subtle narrowing of the right V1 segment which may be related to fibromuscular dysplasia. There is a 5 x 4 mm outpouching along the V1 segment of the left vertebral artery concerning for aneurysm (series 11 image 171, series 10 image 232). No dissection or arterial injury. LUNGS AND MEDIASTINUM: Unremarkable. SOFT TISSUES: Left thyroid lobe nodule measuring up to 1.0 cm. No acute abnormality. BONES: Degenerative changes in the visualized spine. Edentulous maxilla. No acute abnormality. CTA HEAD: ANTERIOR CIRCULATION: Redemonstrated slightly hyperattenuating aneurysm sac in the right middle cranial fossa which measures 3.0 x 2.9 x 3.0 cm (previously measuring 2.5 x 3.0 x 2.6 cm). There is associated remodeling of bone along the floor of the right middle cranial fossa. Enhancing portion of the aneurysm sac measures 2.4 x 2.5 x 1.6 cm. The inferior portion of the aneurysm is  nonenhancing similar to the prior study suggestive of partial thrombosis. The aneurysm arises from the right cavernous ICA. Similar mass effect on the right lateral rectus with associated esotropia. There is a wide aneurysm neck measuring at least 8 mm in length. There is an additional aneurysm along the right supraclinoid ICA which measures 5 x 4 mm, similar to prior. Atherosclerosis of the carotid siphons without high grade stenosis. There is early bifurcation versus duplication of the left M1 segment. Mild narrowing of multiple M2 branches of the left MCA. No significant stenosis of the anterior cerebral arteries. POSTERIOR CIRCULATION: Fetal origin of the right PCA. No significant stenosis of the posterior cerebral arteries. No significant stenosis of the basilar artery. No significant stenosis of the vertebral arteries. No aneurysm. OTHER: There are similar chronic microvascular ischemic changes and remote lacunar infarcts in the bilateral basal ganglia which appear new since the 2023 study. Mild parenchymal volume loss. Replacement mucosal thickening in the right sphenoid sinus findings suggestive of chronic sphenoid sinusitis. There is a spot rounded focus of soft tissue within the right sphenoid sinus which is lower to prior and could reflect prominent secretions versus fungal mycetoma. No dural venous sinus thrombosis on this non-dedicated study. IMPRESSION: 1. Redemonstrated large aneurysm originating from the right cavernous ICA extending into the right middle cranial fossa, measuring 3.0 x 2.9 x 3.0 cm, previously 2.5 x 3.0 x 2.6 cm. The contrast filling portion of the aneurysm appears increased in size. Additional non-filling portion of the aneurysm sac inferiorly, likely reflects partial thrombosis. 2. 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 3. Outpouching along the V1 segment of the left vertebral artery, measuring 5 x 4 mm, concerning  for aneurysm. 4. Multifocal irregularity with areas of subtle narrowing and outpouching of the distal left cervical ICA and V1 segments of the vertebral arteries, likely related to fibromuscular dysplasia. 5. Findings suggestive of chronic sphenoid sinusitis with a rounded focus of soft tissue within the right sphenoid sinus, which could reflect prominent secretions versus fungal mycetoma. Electronically signed by: Donnice Mania MD 12/30/2024 12:43 PM EST RP Workstation: [id number]    Assessment: 89 y.o. female with medical history significant of brain aneurysms, HTN, OSA and COPD who presented with left sided weakness. MRI of the brain personally reviewed and reveals small acute infarcts in  the posterior right corona radiata and basal ganglia.  Although small vessel disease a possible etiology can not rule out an embolic cause.  CTA personally reviewed and shows known aneurysms with slight enlargement noted in one but no evidence of LVO.  A1c 5.5, LDL 115.    Stroke Risk Factors - hypertension  Plan: 1. High intensity statin initiation 2. PT/OT/Speech therapy 3. Echocardiogram pending 4. Prophylactic therapy-Dual antiplatelet therapy with ASA 81mg  and Plavix  75mg  for three weeks with change to ASA 81mg  alone as monotherapy after that time.  5. Telemetry monitoring 6. Frequent neuro checks  Case discussed with Dr. Tobie Sonny Hock, MD Neurology  12/31/2024, 1:46 PM         [1]  Allergies Allergen Reactions   Cortisone Swelling        Penicillins Swelling    Did it involve swelling of the face/tongue/throat, SOB, or low BP? No Did it involve sudden or severe rash/hives, skin peeling, or any reaction on the inside of your mouth or nose? No Did you need to seek medical attention at a hospital or doctor's office? No When did it last happen?      7-8 years If all above answers are NO, may proceed with cephalosporin use.    Valium [Diazepam] Other (See Comments)    Couldn't  walk

## 2024-12-31 NOTE — Evaluation (Signed)
 Physical Therapy Evaluation Patient Details Name: Briana Lozano MRN: [id number] DOB: 08/17/1929 Today's Date: 12/31/2024  History of Present Illness  Briana Lozano is a 89 y.o. female with medical history significant of brain aneurysms.  She presents with left-sided weakness with dragging her left foot and no use of her finger since Thursday. CT angiogram redemonstrates a large aneurysm originating from the right cavernous ICA extending into the right middle cranial fossa measuring 3 x 2.9 x 3 cm and the contrast portion appears increased in size.  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  MRI of the brain shows small acute infarcts in the posterior right corona radiata and basal ganglia.  Clinical Impression  Patient seated on BSC with OT upon arrival to room, endorsing acute onset of dizziness (feel like I'm going to pass out).  Promptly assisted patient with return to bed, ultimately to supine, for symptom management and recovery.  BP stable and WFL upon return to supine; will check formal orthostatics next session if symptoms persist. Patient generally alert and oriented, follows commands and cooperative with session components.  Notably fatigued with recent transfer/toileting activities. Denies pain; does demonstrate moderate degree of L hemiparesis, UE > LE (see below for details).  Denies sensory change. Currently requiring mod assist +2 for sit/stand, standing balance and BSC to bed transfer.  Difficulty with functional use/grasp of L UE; constant support for static and dynamic standing balance.  Additional gait/mobility deferred due to dizziness/lightheadedness.  Will continue to assess/progress as appropriate in subsequent sessions. Would benefit from skilled PT to address above deficits and promote optimal return to PLOF.; recommend post-acute PT follow up as indicated by interdisciplinary care team.          If plan is discharge home,  recommend the following: A lot of help with walking and/or transfers;A lot of help with bathing/dressing/bathroom   Can travel by private vehicle   No    Equipment Recommendations    Recommendations for Other Services       Functional Status Assessment Patient has had a recent decline in their functional status and demonstrates the ability to make significant improvements in function in a reasonable and predictable amount of time.     Precautions / Restrictions Precautions Precautions: Fall Recall of Precautions/Restrictions: Intact Restrictions Weight Bearing Restrictions Per Provider Order: No      Mobility  Bed Mobility Overal bed mobility: Needs Assistance Bed Mobility: Sit to Supine       Sit to supine: Min assist, Mod assist   General bed mobility comments: increased assist provided due to reports of lightheadedness after toileting    Transfers Overall transfer level: Needs assistance Equipment used: 2 person hand held assist Transfers: Bed to chair/wheelchair/BSC, Sit to/from Stand Sit to Stand: Mod assist, +2 safety/equipment   Step pivot transfers: Mod assist, +2 safety/equipment       General transfer comment: assist for lift off, standing balance and pivot between seating surfaces    Ambulation/Gait               General Gait Details: deferred due to dizziness, lightheadedness endorsed after toileting episode  Stairs            Wheelchair Mobility     Tilt Bed    Modified Rankin (Stroke Patients Only)       Balance Overall balance assessment: Needs assistance Sitting-balance support: No upper extremity supported, Feet supported Sitting balance-Leahy Scale: Fair     Standing  balance support: Bilateral upper extremity supported Standing balance-Leahy Scale: Poor                               Pertinent Vitals/Pain Pain Assessment Pain Assessment: No/denies pain    Home Living Family/patient expects to be  discharged to:: Private residence Living Arrangements: Children Available Help at Discharge: Available PRN/intermittently;Family Type of Home: House Home Access: Stairs to enter Entrance Stairs-Rails: Can reach both Entrance Stairs-Number of Steps: 2-3   Home Layout: One level Home Equipment: [employer] (2 wheels);BSC/3in1;Cane - single point;Wheelchair - manual;Grab bars - toilet;Grab bars - tub/shower;Shower seat - built in;Rollator (4 wheels)      Prior Function Prior Level of Function : Independent/Modified Independent;History of Falls (last six months)             Mobility Comments: Ambulatory with [facility] for household mobilization; [facility] for community appointments ADLs Comments: able to manage basic self care tasks without A; son manages IADLs     Extremity/Trunk Assessment   Upper Extremity Assessment RUE Deficits / Details: long hx of RTC tear, coritsone injection last week LUE Deficits / Details: 2+ elbow flexion, shoulder flexion; 1/5 wrist; 0/5 gross grasp    Lower Extremity Assessment Lower Extremity Assessment: LLE deficits/detail LLE Deficits / Details: grossly at least 3-/5 throughout; denies sensory deficit       Communication   Communication Communication: No apparent difficulties    Cognition Arousal: Alert Behavior During Therapy: WFL for tasks assessed/performed   PT - Cognitive impairments: No apparent impairments                         Following commands: Intact       Cueing Cueing Techniques: Verbal cues     General Comments      Exercises Other Exercises Other Exercises: Patient seated on BSC with OT upon arrival to room, noted with continent BM.  Patient acutely endorsing onset of dizziness, feel like I'm going to pass out (unclear if true orthostasis versus perceived fatigue).  Assisted back to bed, mod assist +2, for safety wtih return to supine for symptom-management.  BP 120s/60s once measured in supine; symptoms  resolving. Other Exercises: Rolling bilat, min/mod assist; dep assist for hygiene and peri-care at bed-level   Assessment/Plan    PT Assessment Patient needs continued PT services  PT Problem List Decreased strength;Decreased range of motion;Decreased balance;Decreased activity tolerance;Decreased mobility;Decreased coordination;Decreased knowledge of use of DME;Decreased safety awareness;Decreased knowledge of precautions       PT Treatment Interventions DME instruction;Gait training;Stair training;Functional mobility training;Therapeutic activities;Therapeutic exercise;Balance training;Neuromuscular re-education;Patient/family education    PT Goals (Current goals can be found in the Care Plan section)  Acute Rehab PT Goals Patient Stated Goal: to get L side stronger PT Goal Formulation: With patient Time For Goal Achievement: 01/14/25 Potential to Achieve Goals: Fair    Frequency Min 3X/week     Co-evaluation               AM-PAC PT 6 Clicks Mobility  Outcome Measure Help needed turning from your back to your side while in a flat bed without using bedrails?: A Lot Help needed moving from lying on your back to sitting on the side of a flat bed without using bedrails?: A Lot Help needed moving to and from a bed to a chair (including a wheelchair)?: A Lot Help needed standing up from a chair using  your arms (e.g., wheelchair or bedside chair)?: A Lot Help needed to walk in hospital room?: A Lot Help needed climbing 3-5 steps with a railing? : Total 6 Click Score: 11    End of Session   Activity Tolerance: Patient tolerated treatment well;Treatment limited secondary to medical complications (Comment) (subjective reports of dizziness/lightheadedness) Patient left: in bed;with call bell/phone within reach;with bed alarm set;with nursing/sitter in room Nurse Communication: Mobility status PT Visit Diagnosis: Hemiplegia and hemiparesis Hemiplegia - Right/Left:  Left Hemiplegia - dominant/non-dominant: Non-dominant Hemiplegia - caused by: Cerebral infarction    Time: 8841-8783 PT Time Calculation (min) (ACUTE ONLY): 18 min   Charges:   PT Evaluation $PT Eval Moderate Complexity: 1 Mod   PT General Charges $$ ACUTE PT VISIT: 1 Visit         Briany Aye H. Delores, PT, DPT, NCS 12/31/24, 4:00 PM [phone]

## 2024-12-31 NOTE — Progress Notes (Signed)
 SLP Cancellation Note  Patient Details Name: Briana Lozano MRN: [id number] DOB: Dec 26, 1928   Cancelled treatment:       Reason Eval/Treat Not Completed: SLP screened, no needs identified, will sign off   Wendolyn Raso 12/31/2024, 12:53 PM

## 2025-01-01 DIAGNOSIS — I639 Cerebral infarction, unspecified: Secondary | ICD-10-CM | POA: Diagnosis not present

## 2025-01-01 LAB — GLUCOSE, CAPILLARY
Glucose-Capillary: 84 mg/dL (ref 70–99)
Glucose-Capillary: 191 mg/dL — ABNORMAL HIGH (ref 70–99)

## 2025-01-01 NOTE — Progress Notes (Signed)
Per Dr Posey Pronto, d.c tele monitoring

## 2025-01-01 NOTE — TOC Initial Note (Signed)
 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  RNCM educated her on process of short term rehab.  She lives with her son presently and he provides transportation, assistance with all ADL's and IADL's, supportive.  She intends to return home once rehab is completed.  She has a PCP and son picks up medications, no financial concerns with medications.  RNCM will continue to follow case until discharge.               Expected Discharge Plan: Skilled Nursing Facility Barriers to Discharge: Continued Medical Work up   Patient Goals and CMS Choice Patient states their goals for this hospitalization and ongoing recovery are:: to eventually get home          Expected Discharge Plan and Services   Discharge Planning Services: CM Consult   Living arrangements for the past 2 months: Single Family Home                                      Prior Living Arrangements/Services Living arrangements for the past 2 months: Single Family Home Lives with:: Adult Children Patient language and need for interpreter reviewed:: Yes Do you feel safe going back to the place where you live?: Yes      Need for Family Participation in Patient Care: Yes (Comment) Care giver support system in place?: Yes (comment)   Criminal Activity/Legal Involvement Pertinent to Current Situation/Hospitalization: No - Comment as needed  Activities of Daily Living   ADL Screening (condition at time of admission) Independently performs ADLs?: No Does the patient have a NEW difficulty with  bathing/dressing/toileting/self-feeding that is expected to last >3 days?: Yes (Initiates electronic notice to provider for possible OT consult) Does the patient have a NEW difficulty with getting in/out of bed, walking, or climbing stairs that is expected to last >3 days?: Yes (Initiates electronic notice to provider for possible PT consult) Does the patient have a NEW difficulty with communication that is expected to last >3 days?: Yes (Initiates electronic notice to provider for possible SLP consult) Is the patient deaf or have difficulty hearing?: No Does the patient have difficulty seeing, even when wearing glasses/contacts?: Yes Does the patient have difficulty concentrating, remembering, or making decisions?: No  Permission Sought/Granted                  Emotional Assessment Appearance:: Appears stated age Attitude/Demeanor/Rapport: Engaged Affect (typically observed): Appropriate Orientation: : Oriented to Self, Oriented to Place, Oriented to  Time, Oriented to Situation Alcohol  / Substance Use: Never Used Psych Involvement: No (comment)  Admission diagnosis:  Cerebrovascular accident (CVA), unspecified mechanism (HCC) [I63.9] Acute stroke due to ischemia [facility]) [I63.9] Patient Active Problem List   Diagnosis Date Noted   Acute stroke due to ischemia (HCC) 12/30/2024   Polyneuropathy 12/30/2024   Idiopathic hypotension    Syncope 08/05/2022   Blindness of left eye 08/05/2022   Nausea & vomiting 05/26/2022   Hypertensive urgency 05/26/2022  Acute lower UTI    Dysuria    Nausea and vomiting    Stroke (HCC) 09/20/2021   TIA (transient ischemic attack) 09/19/2021   Constipation 09/19/2021   Orthostatic hypotension    Numbness and tingling of right side of face    GERD (gastroesophageal reflux disease)    Right facial pain 09/10/2021   Weakness    Acute metabolic encephalopathy    Hyponatremia 09/09/2021   Altered mental status, unspecified 09/09/2021   Cerebral  aneurysm without rupture    COPD (chronic obstructive pulmonary disease) (HCC)    Depression    AMS (altered mental status)    Hiatal hernia    Hypokalemia    Abdominal pain 01/28/2019   Hypothyroidism, unspecified 01/17/2018   Accelerated hypertension 01/17/2018   Intermediate coronary syndrome (HCC) 01/17/2018   Macular degeneration 01/17/2018   Senile osteoporosis 01/17/2018   Obstructive sleep apnea syndrome 11/10/2015   Bowel habit changes 06/16/2014   BRBPR (bright red blood per rectum) 06/16/2014   Anxiety 05/29/2014   PCP:  Glover Lenis, MD Pharmacy:   [facility] - [place], [place] - [address] [place] [address] Phone: [phone] Fax: [phone]     Social Drivers of Health (SDOH) Social History: SDOH Screenings   Food Insecurity: No Food Insecurity (12/30/2024)  Housing: Low Risk (12/30/2024)  Transportation Needs: No Transportation Needs (12/30/2024)  Utilities: Not At Risk (12/30/2024)  Financial Resource Strain: Low Risk  (07/28/2024)   Received from [facility]  Social Connections: Unknown (12/30/2024)  Tobacco Use: Low Risk (12/30/2024)   SDOH Interventions:     Readmission Risk Interventions     No data to display

## 2025-01-01 NOTE — TOC Progression Note (Signed)
 Transition of Care [facility]) - Progression Note    Patient Details  Name: Briana Lozano MRN: [id number] Date of Birth: Oct 13, 1929  Transition [facility] Care [facility]) CM/SW Contact  Grayce JAYSON Perfect, RN Phone Number: 01/01/2025, 4:31 PM  Clinical Narrative:     Approved JluyPI:[id number] Dates:1/23-1/26/2026 Next Review Date: 01/05/2025   Patient in agreement with bed offer from [facility].  RNCM accepted bed offer. Plan for discharge Expected Discharge Plan: Skilled Nursing Facility Barriers to Discharge: Continued Medical Work up               Expected Discharge Plan and Services   Discharge Planning Services: CM Consult   Living arrangements for the past 2 months: Single Family Home                                       Social Drivers of Health (SDOH) Interventions SDOH Screenings   Food Insecurity: No Food Insecurity (12/30/2024)  Housing: Low Risk (12/30/2024)  Transportation Needs: No Transportation Needs (12/30/2024)  Utilities: Not At Risk (12/30/2024)  Financial Resource Strain: Low Risk  (07/28/2024)   Received from [facility]  Social Connections: Unknown (12/30/2024)  Tobacco Use: Low Risk (12/30/2024)    Readmission Risk Interventions     No data to display

## 2025-01-01 NOTE — Plan of Care (Signed)
" °  Problem: Self-Care: Goal: Ability to communicate needs accurately will improve Outcome: Progressing   Problem: Nutrition: Goal: Risk of aspiration will decrease Outcome: Progressing   Problem: Education: Goal: Knowledge of General Education information will improve Description: Including pain rating scale, medication(s)/side effects and non-pharmacologic comfort measures Outcome: Progressing   Problem: Clinical Measurements: Goal: Will remain free from infection Outcome: Progressing   "

## 2025-01-01 NOTE — Progress Notes (Signed)
 Occupational Therapy Treatment Patient Details Name: Briana Lozano MRN: [id number] DOB: November 20, 1929 Today's Date: 01/01/2025   History of present illness Briana Lozano is a 89 y.o. female with medical history significant of brain aneurysms.  She presents with left-sided weakness with dragging her left foot and no use of her finger since Thursday. CT angiogram redemonstrates a large aneurysm originating from the right cavernous ICA extending into the right middle cranial fossa measuring 3 x 2.9 x 3 cm and the contrast portion appears increased in size.  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  MRI of the brain shows small acute infarcts in the posterior right corona radiata and basal ganglia.   OT comments  Patient seen for OT treatment on this date. Upon arrival to room patient in bed on bed pan, soiled bed. Agreeable to transfer to recliner. Max A to transition to EOB, max A for SPT from EOB to recliner. BP checked supine 124/61, EOB 94/67 2 minutes later 124/61, transferred to recliner and BP 140/74. RN aware. Patient presents with continued LUE hemiparesis and poor sitting balance. Patient ended treatment in recliner with bed/chair alarm on and all needs within reach. Patient making good progress toward goals, will continue to follow POC. Discharge recommendation remains appropriate.   Orthostatic VS for the past 24 hrs:  BP- Lying BP- Sitting  01/01/25 1429 -- 140/74  01/01/25 1300 124/61 94/67          If plan is discharge home, recommend the following:  A lot of help with walking and/or transfers;A lot of help with bathing/dressing/bathroom;Assistance with cooking/housework;Direct supervision/assist for medications management;Direct supervision/assist for financial management;Help with stairs or ramp for entrance   Equipment Recommendations  None recommended by OT    Recommendations for Other Services      Precautions /  Restrictions Precautions Precautions: Fall Recall of Precautions/Restrictions: Intact Restrictions Weight Bearing Restrictions Per Provider Order: No       Mobility Bed Mobility Overal bed mobility: Needs Assistance Bed Mobility: Supine to Sit     Supine to sit: Max assist     General bed mobility comments: max A to scoot forward and lift trunk    Transfers Overall transfer level: Needs assistance Equipment used: 1 person hand held assist Transfers: Bed to chair/wheelchair/BSC       Step pivot transfers: Max assist     General transfer comment: assist for lift off, standing balance and pivot between seating surfaces     Balance Overall balance assessment: Needs assistance Sitting-balance support: No upper extremity supported, Feet supported Sitting balance-Leahy Scale: Fair   Postural control: Posterior lean, Right lateral lean Standing balance support: Bilateral upper extremity supported Standing balance-Leahy Scale: Poor                             ADL either performed or assessed with clinical judgement   ADL Overall ADL's : Needs assistance/impaired                                       General ADL Comments: anticipate max A for ADLs due to hemiparesis on LUE, limited mobillity to RUE from RTC tear, poor dynamic sitting balance    Extremity/Trunk Assessment Upper Extremity Assessment Upper Extremity Assessment: RUE deficits/detail;LUE deficits/detail RUE Deficits / Details: long hx of RTC tear, coritsone injection last  week LUE Deficits / Details: 2+ elbow flexion, shoulder flexion; 1/5 wrist; 0/5 gross grasp            Vision       Perception     Praxis     Communication Communication Communication: No apparent difficulties   Cognition Arousal: Alert Behavior During Therapy: WFL for tasks assessed/performed Cognition: No apparent impairments                               Following commands:  Intact        Cueing   Cueing Techniques: Verbal cues  Exercises      Shoulder Instructions       General Comments      Pertinent Vitals/ Pain       Pain Assessment Pain Assessment: No/denies pain  Home Living                                          Prior Functioning/Environment              Frequency  Min 3X/week        Progress Toward Goals  OT Goals(current goals can now be found in the care plan section)  Progress towards OT goals: Progressing toward goals  Acute Rehab OT Goals Patient Stated Goal: to feel better OT Goal Formulation: With patient Time For Goal Achievement: 01/14/25 Potential to Achieve Goals: Good ADL Goals Pt Will Perform Grooming: with set-up;sitting Pt Will Perform Lower Body Dressing: with min assist;sit to/from stand Pt Will Transfer to Toilet: with min assist;bedside commode;ambulating Pt Will Perform Toileting - Clothing Manipulation and hygiene: with min assist;sit to/from stand  Plan      Co-evaluation                 AM-PAC OT 6 Clicks Daily Activity     Outcome Measure   Help from another person eating meals?: A Little Help from another person taking care of personal grooming?: A Lot Help from another person toileting, which includes using toliet, bedpan, or urinal?: A Lot Help from another person bathing (including washing, rinsing, drying)?: A Lot Help from another person to put on and taking off regular upper body clothing?: A Lot Help from another person to put on and taking off regular lower body clothing?: A Lot 6 Click Score: 13    End of Session Equipment Utilized During Treatment: Gait belt  OT Visit Diagnosis: Unsteadiness on feet (R26.81);Other abnormalities of gait and mobility (R26.89);Muscle weakness (generalized) (M62.81);Hemiplegia and hemiparesis Hemiplegia - Right/Left: Left Hemiplegia - dominant/non-dominant: Non-Dominant Hemiplegia - caused by: Cerebral infarction    Activity Tolerance Patient limited by fatigue   Patient Left with family/visitor present;with nursing/sitter in room;in chair   Nurse Communication Mobility status        Time: 8747-8678 OT Time Calculation (min): 29 min  Charges: OT General Charges $OT Visit: 1 Visit OT Treatments $Self Care/Home Management : 8-22 mins  Rogers Clause, OT/L MSOT, 01/01/2025

## 2025-01-01 NOTE — [place] FL2 (Signed)
 " [place]  MEDICAID FL2 LEVEL OF CARE FORM     IDENTIFICATION  Patient Name: Briana Lozano Birthdate: [birth year] Sex: female Admission Date (Current Location): 12/30/2024  [place] Number:  Chiropodist and Address:  [facility], [address] [place], [place] [address]      Provider Number:    Attending Physician Name and Address:  Tobie Calix, MD  Relative Name and Phone Number:  LOUETTA CHERENE LITTIE Gilmer, Emergency Contact  [phone] (Home Phone)    Current Level of Care: Hospital Recommended Level of Care: Skilled Nursing Facility Prior Approval Number:    Date Approved/Denied:   PASRR Number: PASRR Number:  [id number]  Discharge Plan: SNF    Current Diagnoses: Patient Active Problem List   Diagnosis Date Noted   Acute stroke due to ischemia (HCC) 12/30/2024   Polyneuropathy 12/30/2024   Idiopathic hypotension    Syncope 08/05/2022   Blindness of left eye 08/05/2022   Nausea & vomiting 05/26/2022   Hypertensive urgency 05/26/2022   Acute lower UTI    Dysuria    Nausea and vomiting    Stroke (HCC) 09/20/2021   TIA (transient ischemic attack) 09/19/2021   Constipation 09/19/2021   Orthostatic hypotension    Numbness and tingling of right side of face    GERD (gastroesophageal reflux disease)    Right facial pain 09/10/2021   Weakness    Acute metabolic encephalopathy    Hyponatremia 09/09/2021   Altered mental status, unspecified 09/09/2021   Cerebral aneurysm without rupture    COPD (chronic obstructive pulmonary disease) (HCC)    Depression    AMS (altered mental status)    Hiatal hernia    Hypokalemia    Abdominal pain 01/28/2019   Hypothyroidism, unspecified 01/17/2018   Accelerated hypertension 01/17/2018   Intermediate coronary syndrome (HCC) 01/17/2018   Macular degeneration 01/17/2018   Senile osteoporosis 01/17/2018   Obstructive sleep apnea syndrome 11/10/2015   Bowel habit changes  06/16/2014   BRBPR (bright red blood per rectum) 06/16/2014   Anxiety 05/29/2014    Orientation RESPIRATION BLADDER Height & Weight     Self, Time, Situation, Place  Normal Continent Weight: 52.2 kg Height:  5' 3 (160 cm)  BEHAVIORAL SYMPTOMS/MOOD NEUROLOGICAL BOWEL NUTRITION STATUS      Continent Diet (regular, thin liquids)  AMBULATORY STATUS COMMUNICATION OF NEEDS Skin   Extensive Assist Verbally Normal                       Personal Care Assistance Level of Assistance  Bathing, Dressing Bathing Assistance: Limited assistance   Dressing Assistance: Limited assistance     Functional Limitations Info  Sight (right eye- blind,  left - reading glasses) Sight Info: Impaired        SPECIAL CARE FACTORS FREQUENCY                       Contractures Contractures Info: Not present    Additional Factors Info  Code Status, Allergies Code Status Info: DNR- limited Allergies Info: valium, cortisone, PCN           Current Medications (01/01/2025):  This is the current hospital active medication list Current Facility-Administered Medications  Medication Dose Route Frequency Provider Last Rate Last Admin   amLODipine  (NORVASC ) tablet 5 mg  5 mg Oral Daily Josette Ade, MD   5 mg at 01/01/25 0829   aspirin  EC tablet 81 mg  81 mg  Oral Daily Josette Ade, MD   81 mg at 01/01/25 9170   atorvastatin  (LIPITOR) tablet 40 mg  40 mg Oral Daily Patel, Sona, MD   40 mg at 01/01/25 9170   bisoprolol  (ZEBETA ) tablet 2.5 mg  2.5 mg Oral Daily Josette Ade, MD   2.5 mg at 01/01/25 9167   citalopram  (CELEXA ) tablet 10 mg  10 mg Oral Daily Josette Ade, MD   10 mg at 01/01/25 9170   clopidogrel  (PLAVIX ) tablet 75 mg  75 mg Oral Daily Josette Ade, MD   75 mg at 01/01/25 9170   cyanocobalamin  (VITAMIN B12) injection 1,000 mcg  1,000 mcg Intramuscular Q30 days Patel, Sona, MD   1,000 mcg at 12/31/24 1641   enoxaparin  (LOVENOX ) injection 40 mg  40 mg Subcutaneous  Q24H Patel, Sona, MD   40 mg at 12/31/24 2137   feeding supplement (ENSURE ENLIVE / ENSURE PLUS) liquid 237 mL  237 mL Oral BID BM Josette Ade, MD   237 mL at 01/01/25 0830   gabapentin  (NEURONTIN ) capsule 300 mg  300 mg Oral TID Josette Ade, MD   300 mg at 01/01/25 9170   labetalol  (NORMODYNE ) injection 10 mg  10 mg Intravenous Q2H PRN Josette Ade, MD       levothyroxine  (SYNTHROID ) tablet 75 mcg  75 mcg Oral Q0600 Josette Ade, MD   75 mcg at 01/01/25 9392   loratadine  (CLARITIN ) tablet 10 mg  10 mg Oral Daily Josette Ade, MD   10 mg at 01/01/25 9170   midodrine  (PROAMATINE ) tablet 2.5 mg  2.5 mg Oral TID WC Patel, Sona, MD   2.5 mg at 12/31/24 1641   pantoprazole  (PROTONIX ) EC tablet 40 mg  40 mg Oral QHS Wieting, Richard, MD   40 mg at 12/31/24 2138   polyethylene glycol (MIRALAX  / GLYCOLAX ) packet 17 g  17 g Oral Daily PRN Josette Ade, MD       polyethylene glycol (MIRALAX  / GLYCOLAX ) packet 17 g  17 g Oral Daily PRN Josette Ade, MD   17 g at 12/31/24 1008   pregabalin  (LYRICA ) capsule 25 mg  25 mg Oral BID Patel, Sona, MD   25 mg at 01/01/25 9170   senna (SENOKOT) tablet 8.6 mg  1 tablet Oral QHS Josette Ade, MD   8.6 mg at 12/31/24 2137     Discharge Medications: Please see discharge summary for a list of discharge medications.  Relevant Imaging Results:  Relevant Lab Results:   Additional Information    Grayce JAYSON Perfect, RN     "

## 2025-01-01 NOTE — Progress Notes (Signed)
 PT Cancellation Note  Patient Details Name: Briana Lozano MRN: [id number] DOB: July 25, 1929   Cancelled Treatment:    Reason Eval/Treat Not Completed: Other (comment). PT attempted twice, first attempt right after pt had worked with OT and was up in the chair. Second attempt pt requested to stay up in chair longer, PT to attempt gait as able.    Doyal Shams PT, DPT 3:32 PM,01/01/25

## 2025-01-01 NOTE — Progress Notes (Signed)
 [facility]  - [place] at [facility]   PATIENT NAME: Briana Lozano    MR#:  [id number]  DATE OF BIRTH:  02-09-1929  SUBJECTIVE:   patient's friend at bedside. Came in with weakness on the left lower extremity. Patient overall feels better. PT OT to see. Patient tolerating PO diet   VITALS:  Blood pressure (!) 160/85, pulse 72, temperature 98.6 F (37 C), resp. rate 14, height 5' 3 (1.6 m), weight 52.2 kg, SpO2 95%.  PHYSICAL EXAMINATION:   GENERAL:  89 y.o.-year-old patient with no acute distress.  LUNGS: Normal breath sounds bilaterally, no wheezing CARDIOVASCULAR: S1, S2 normal. No murmur   ABDOMEN: Soft, nontender, nondistended. Bowel sounds present.  EXTREMITIES: No  edema b/l.    NEUROLOGIC: patient is alert and awake left lower extremity mild weakness.[place] left hand grip Right eye-legally blind  LABORATORY PANEL:  CBC Recent Labs  Lab 12/31/24 0450  WBC 7.4  HGB 13.1  HCT 38.3  PLT 192    Chemistries  Recent Labs  Lab 12/30/24 1040 12/31/24 0450  NA 138 136  K 4.0 3.9  CL 102 101  CO2 29 26  GLUCOSE 90 87  BUN 13 10  CREATININE 0.71 0.61  CALCIUM  9.1 9.1  AST 19  --   ALT 7  --   ALKPHOS 37*  --   BILITOT 0.4  --    Cardiac Enzymes No results for input(s): TROPONINI in the last 168 hours. RADIOLOGY:  ECHOCARDIOGRAM COMPLETE Result Date: 12/31/2024    ECHOCARDIOGRAM REPORT   Patient Name:   VALEREE LEIDY Date of Exam: 12/31/2024 Medical Rec #:  [id number]             Height:       63.0 in Accession #:    [id number]            Weight:       115.0 lb Date of Birth:  [birth year]            BSA:          1.528 m Patient Age:    89 years              BP:           139/87 mmHg Patient Gender: F                     HR:           64 bpm. Exam Location:  [facility] Procedure: 2D Echo, Cardiac Doppler, Color Doppler, Strain Analysis and Saline            Contrast Bubble Study (Both Spectral and Color Flow Doppler were            utilized  during procedure). Indications:     Stroke  History:         Patient has prior history of Echocardiogram examinations, most                  recent 09/20/2021. COPD and TIA; Risk Factors:Sleep Apnea and                  Hypertension.  Sonographer:     Philomena Daring Referring Phys:  [id number] CHARLIE PATTERSON Diagnosing Phys: Dwayne D Callwood MD  Sonographer Comments: Global longitudinal strain was attempted. IMPRESSIONS  1. Left ventricular ejection fraction, by estimation, is 60 to 65%. The left ventricle has normal function. The left ventricle has no regional wall  motion abnormalities. There is mild concentric left ventricular hypertrophy. Left ventricular diastolic parameters are consistent with Grade I diastolic dysfunction (impaired relaxation). The average left ventricular global longitudinal strain is 14.2 %. The global longitudinal strain is abnormal.  2. Right ventricular systolic function is normal. The right ventricular size is normal.  3. The mitral valve is grossly normal. Mild mitral valve regurgitation.  4. Tricuspid valve regurgitation is mild to moderate.  5. The aortic valve is grossly normal. Aortic valve regurgitation is trivial. Aortic valve sclerosis is present, with no evidence of aortic valve stenosis.  6. Agitated saline contrast bubble study was negative, with no evidence of any interatrial shunt. FINDINGS  Left Ventricle: Left ventricular ejection fraction, by estimation, is 60 to 65%. The left ventricle has normal function. The left ventricle has no regional wall motion abnormalities. The average left ventricular global longitudinal strain is 14.2 %. Strain was performed and the global longitudinal strain is abnormal. The left ventricular internal cavity size was normal in size. There is mild concentric left ventricular hypertrophy. Left ventricular diastolic parameters are consistent with Grade I diastolic dysfunction (impaired relaxation). Right Ventricle: The right ventricular size is  normal. No increase in right ventricular wall thickness. Right ventricular systolic function is normal. Left Atrium: Left atrial size was normal in size. Right Atrium: Right atrial size was normal in size. Pericardium: The pericardium was not assessed. Mitral Valve: The mitral valve is grossly normal. There is mild thickening of the mitral valve leaflet(s). There is mild calcification of the mitral valve leaflet(s). Mild mitral valve regurgitation. Tricuspid Valve: The tricuspid valve is grossly normal. Tricuspid valve regurgitation is mild to moderate. Aortic Valve: The aortic valve is grossly normal. Aortic valve regurgitation is trivial. Aortic regurgitation PHT measures 842 msec. Aortic valve sclerosis is present, with no evidence of aortic valve stenosis. Pulmonic Valve: The pulmonic valve was not well visualized. Pulmonic valve regurgitation is not visualized. Aorta: The aortic root was not well visualized. IAS/Shunts: No atrial level shunt detected by color flow Doppler. Agitated saline contrast was given intravenously to evaluate for intracardiac shunting. Agitated saline contrast bubble study was negative, with no evidence of any interatrial shunt. Additional Comments: 3D was performed not requiring image post processing on an independent workstation and was indeterminate.  LEFT VENTRICLE PLAX 2D LVIDd:         3.00 cm   Diastology LVIDs:         2.00 cm   LV e' medial:    3.81 cm/s LV PW:         1.10 cm   LV E/e' medial:  17.9 LV IVS:        1.20 cm   LV e' lateral:   5.98 cm/s LVOT diam:     1.80 cm   LV E/e' lateral: 11.4 LV SV:         58 LV SV Index:   38        2D Longitudinal Strain LVOT Area:     2.54 cm  2D Strain GLS (A4C):   13.2 %                          2D Strain GLS (A3C):   12.4 %                          2D Strain GLS (A2C):   16.9 %  2D Strain GLS Avg:     14.2 % RIGHT VENTRICLE             IVC RV Basal diam:  3.60 cm     IVC diam: 1.20 cm RV Mid diam:    2.50 cm  RV S prime:     11.70 cm/s TAPSE (M-mode): 1.7 cm LEFT ATRIUM             Index        RIGHT ATRIUM           Index LA diam:        2.80 cm 1.83 cm/m   RA Area:     14.00 cm LA Vol (A2C):   38.7 ml 25.32 ml/m  RA Volume:   36.40 ml  23.82 ml/m LA Vol (A4C):   25.6 ml 16.75 ml/m LA Biplane Vol: 32.6 ml 21.33 ml/m  AORTIC VALVE LVOT Vmax:   107.00 cm/s LVOT Vmean:  70.700 cm/s LVOT VTI:    0.229 m AI PHT:      842 msec  AORTA Ao Root diam: 2.60 cm MITRAL VALVE                TRICUSPID VALVE MV Area (PHT): 2.33 cm     TR Peak grad:   28.5 mmHg MV Decel Time: 326 msec     TR Vmax:        267.00 cm/s MV E velocity: 68.10 cm/s MV A velocity: 104.00 cm/s  SHUNTS MV E/A ratio:  0.65         Systemic VTI:  0.23 m                             Systemic Diam: 1.80 cm Cara JONETTA Lovelace MD Electronically signed by Cara JONETTA Lovelace MD Signature Date/Time: 12/31/2024/4:24:58 PM    Final    MR BRAIN WO CONTRAST Result Date: 12/30/2024 EXAM: MRI BRAIN WITHOUT CONTRAST 12/30/2024 03:00:03 PM TECHNIQUE: Multiplanar multisequence MRI of the head/brain was performed without the administration of intravenous contrast. COMPARISON: CTA head and neck 12/30/2024 and MRI head 09/19/2021. CLINICAL HISTORY: Neuro deficit, acute, stroke suspected. FINDINGS: BRAIN AND VENTRICLES: Clustered small acute infarcts involve the posterior right corona radiata and basal ganglia. There are 2 chronic microhemorrhages in the pons which are new from 2022. Patchy to confluent T2 hyperintensities in the cerebral white matter, deep gray nuclei, and brainstem have progressed from the prior MRI and are nonspecific but compatible with moderate to severe extensive chronic small vessel ischemic disease. There is mild cerebral atrophy. Major intracranial arterial flow voids are preserved. A giant right ICA aneurysm is again noted, more fully evaluated on the recent CTA. Bilateral cataract extraction. ORBITS: Bilateral cataract extraction. SINUSES AND  MASTOIDS: Mild mucosal thickening in the paranasal sinuses. Clear mastoid air cells. BONES AND SOFT TISSUES: Normal marrow signal. No soft tissue abnormality. IMPRESSION: 1. Small acute infarcts in the posterior right corona radiata and basal ganglia. 2. Moderate to severe extensive chronic small vessel ischemic disease. 3. Known giant right ICA aneurysm. Electronically signed by: Dasie Hamburg MD 12/30/2024 03:58 PM EST RP Workstation: [id number]   CT ANGIO HEAD NECK W WO CM Result Date: 12/30/2024 EXAM: CTA HEAD AND NECK WITH AND WITHOUT 12/30/2024 11:41:52 AM TECHNIQUE: CTA of the head and neck was performed with and without the administration of 75 mL of iohexol  (OMNIPAQUE ) 350 MG/ML injection. Multiplanar 2D and/or 3D reformatted images are provided  for review. Automated exposure control, iterative reconstruction, and/or weight based adjustment of the mA/kV was utilized to reduce the radiation dose to as low as reasonably achievable. Stenosis of the internal carotid arteries measured using NASCET criteria. COMPARISON: CTA head 06/25/2022 and MRI head 09/19/2021. CLINICAL HISTORY: Neuro deficit, acute, stroke suspected. FINDINGS: CTA NECK: AORTIC ARCH AND ARCH VESSELS: Mild atherosclerosis of the aortic arch. Mild atherosclerosis along the left subclavian artery without significant stenosis. The left vertebral artery originates on the aortic arch. No dissection or arterial injury. No significant stenosis of the brachiocephalic arteries. CERVICAL CAROTID ARTERIES: Atherosclerosis of the right carotid bifurcation without hemodynamically significant stenosis. Partial retropharyngeal course of the proximal right cervical ICA. Atherosclerosis at the left carotid bifurcation without hemodynamically significant stenosis. Redemonstrated multifocal irregularity with areas of subtle narrowing and outpouching of the distal left cervical ICA likely related to fibromuscular dysplasia. No dissection or arterial injury.  CERVICAL VERTEBRAL ARTERIES: Tortuosity of the V1 segment of the right vertebral artery. There is irregularity and subtle narrowing of the right V1 segment which may be related to fibromuscular dysplasia. There is a 5 x 4 mm outpouching along the V1 segment of the left vertebral artery concerning for aneurysm (series 11 image 171, series 10 image 232). No dissection or arterial injury. LUNGS AND MEDIASTINUM: Unremarkable. SOFT TISSUES: Left thyroid lobe nodule measuring up to 1.0 cm. No acute abnormality. BONES: Degenerative changes in the visualized spine. Edentulous maxilla. No acute abnormality. CTA HEAD: ANTERIOR CIRCULATION: Redemonstrated slightly hyperattenuating aneurysm sac in the right middle cranial fossa which measures 3.0 x 2.9 x 3.0 cm (previously measuring 2.5 x 3.0 x 2.6 cm). There is associated remodeling of bone along the floor of the right middle cranial fossa. Enhancing portion of the aneurysm sac measures 2.4 x 2.5 x 1.6 cm. The inferior portion of the aneurysm is nonenhancing similar to the prior study suggestive of partial thrombosis. The aneurysm arises from the right cavernous ICA. Similar mass effect on the right lateral rectus with associated esotropia. There is a wide aneurysm neck measuring at least 8 mm in length. There is an additional aneurysm along the right supraclinoid ICA which measures 5 x 4 mm, similar to prior. Atherosclerosis of the carotid siphons without high grade stenosis. There is early bifurcation versus duplication of the left M1 segment. Mild narrowing of multiple M2 branches of the left MCA. No significant stenosis of the anterior cerebral arteries. POSTERIOR CIRCULATION: Fetal origin of the right PCA. No significant stenosis of the posterior cerebral arteries. No significant stenosis of the basilar artery. No significant stenosis of the vertebral arteries. No aneurysm. OTHER: There are similar chronic microvascular ischemic changes and remote lacunar infarcts in the  bilateral basal ganglia which appear new since the 2023 study. Mild parenchymal volume loss. Replacement mucosal thickening in the right sphenoid sinus findings suggestive of chronic sphenoid sinusitis. There is a spot rounded focus of soft tissue within the right sphenoid sinus which is lower to prior and could reflect prominent secretions versus fungal mycetoma. No dural venous sinus thrombosis on this non-dedicated study. IMPRESSION: 1. Redemonstrated large aneurysm originating from the right cavernous ICA extending into the right middle cranial fossa, measuring 3.0 x 2.9 x 3.0 cm, previously 2.5 x 3.0 x 2.6 cm. The contrast filling portion of the aneurysm appears increased in size. Additional non-filling portion of the aneurysm sac inferiorly, likely reflects partial thrombosis. 2. 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 3. Outpouching along  the V1 segment of the left vertebral artery, measuring 5 x 4 mm, concerning for aneurysm. 4. Multifocal irregularity with areas of subtle narrowing and outpouching of the distal left cervical ICA and V1 segments of the vertebral arteries, likely related to fibromuscular dysplasia. 5. Findings suggestive of chronic sphenoid sinusitis with a rounded focus of soft tissue within the right sphenoid sinus, which could reflect prominent secretions versus fungal mycetoma. Electronically signed by: Donnice Mania MD 12/30/2024 12:43 PM EST RP Workstation: [id number]    Assessment and Plan   From H&P Jasmyne Richard Holz is a 89 y.o. female with medical history significant of brain aneurysms.  She presents with left-sided weakness with dragging her left foot and no use of her finger since Thursday.  Patient states this happened before and she did not want to come into the ER because she thought it would get better.  She did not want to come to the ER on the weekend.  She came in today and a CT angiogram  redemonstrates a large aneurysm originating from the right cavernous ICA extending into the right middle cranial fossa measuring 3 x 2.9 x 3 cm and the contrast portion appears increased in size.  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  MRI of the brain shows small acute infarcts in the posterior right corona radiata and basal ganglia.  Acute stroke due to ischemia [facility]) Patient already takes aspirin  will add Plavix .   Neurology consultation with dr Germaine appreciated.  PT and OT consultations.  Echocardiogram ordered.  Patient with weakness left hand and left foot. --in d/w Dr Marchelle ASA +Plavix  and then drop plavix  after 21 days and cont ASA alone  --start statins --PT/OT to see pt--rehab. TOC for d/c planning   Cerebral aneurysm without rupture One of the aneurysms is larger than previous Has been seen by neurosurgery in the past Pt had decided against any intervention for it in the past   Essential hypertension --BP overall ok. BP meds resumed   Polyneuropathy On gabapentin    Orthostatic hypotension Cont midodrine    GERD (gastroesophageal reflux disease) On Protonix    Depression On Celexa    Hypothyroidism, unspecified On levothyroxine    Procedures: Family communication :friend at bedside Consults : neurology CODE STATUS: DNR DVT Prophylaxis : Lovenox  Level of care: Med-Surg  Py medically best at baseline for d/c  TOTAL TIME TAKING CARE OF THIS PATIENT: 35 minutes.  >50% time spent on counselling and coordination of care  Note: This dictation was prepared with Dragon dictation along with smaller phrase technology. Any transcriptional errors that result from this process are unintentional.  Leita Blanch M.D    [facility]   CC: Primary care physician; Glover Lenis, MD

## 2025-01-02 ENCOUNTER — Other Ambulatory Visit: Payer: Self-pay

## 2025-01-02 DIAGNOSIS — I639 Cerebral infarction, unspecified: Secondary | ICD-10-CM | POA: Diagnosis not present

## 2025-01-02 MED ORDER — ENSURE ENLIVE PO LIQD: 237.0000 mL | Freq: Two times a day (BID) | ORAL | 0 refills | Status: AC

## 2025-01-02 MED ORDER — ATORVASTATIN CALCIUM 40 MG PO TABS: 40.0000 mg | ORAL_TABLET | Freq: Every day | ORAL | 1 refills | Status: AC

## 2025-01-02 MED ORDER — CLOPIDOGREL BISULFATE 75 MG PO TABS: 75.0000 mg | ORAL_TABLET | Freq: Every day | ORAL | 0 refills | Status: AC

## 2025-01-02 MED ORDER — CETIRIZINE HCL 10 MG PO TABS
10.0000 mg | ORAL_TABLET | Freq: Every day | ORAL | 0 refills | Status: AC
  Filled 2025-01-02: qty 30, 30d supply, fill #0

## 2025-01-02 MED ORDER — AMLODIPINE BESYLATE 5 MG PO TABS: 5.0000 mg | ORAL_TABLET | Freq: Every day | ORAL | 0 refills | Status: AC

## 2025-01-02 MED ORDER — ASPIRIN 81 MG PO TBEC: 81.0000 mg | DELAYED_RELEASE_TABLET | Freq: Every day | ORAL | 12 refills | Status: AC

## 2025-01-02 MED ORDER — PANTOPRAZOLE SODIUM 40 MG PO TBEC: 40.0000 mg | DELAYED_RELEASE_TABLET | Freq: Every day | ORAL | 0 refills | Status: AC

## 2025-01-02 NOTE — Plan of Care (Signed)
" °  Problem: Education: Goal: Knowledge of secondary prevention will improve (MUST DOCUMENT ALL) 01/02/2025 0515 by Roark Selinda CROME, RN Outcome: Progressing 01/02/2025 0400 by Roark Selinda CROME, RN Outcome: Progressing   Problem: Ischemic Stroke/TIA Tissue Perfusion: Goal: Complications of ischemic stroke/TIA will be minimized 01/02/2025 0515 by Roark Selinda CROME, RN Outcome: Progressing 01/02/2025 0400 by Roark Selinda CROME, RN Outcome: Progressing   Problem: Health Behavior/Discharge Planning: Goal: Ability to manage health-related needs will improve 01/02/2025 0515 by Roark Selinda CROME, RN Outcome: Progressing 01/02/2025 0400 by Roark Selinda CROME, RN Outcome: Progressing   Problem: Self-Care: Goal: Ability to participate in self-care as condition permits will improve Outcome: Progressing Goal: Verbalization of feelings and concerns over difficulty with self-care will improve Outcome: Progressing   Problem: Nutrition: Goal: Dietary intake will improve Outcome: Progressing   Problem: Education: Goal: Knowledge of General Education information will improve Description: Including pain rating scale, medication(s)/side effects and non-pharmacologic comfort measures Outcome: Progressing   Problem: Clinical Measurements: Goal: Will remain free from infection Outcome: Progressing   Problem: Activity: Goal: Risk for activity intolerance will decrease Outcome: Progressing   Problem: Nutrition: Goal: Adequate nutrition will be maintained Outcome: Progressing   "

## 2025-01-02 NOTE — TOC Transition Note (Signed)
 Transition of Care [facility]) - Discharge Note   Patient Details  Name: Briana Lozano MRN: [id number] Date of Birth: 12-May-1929  [facility] CM/SW Contact:  Grayce JAYSON Perfect, RN Phone Number: 01/02/2025, 10:42 AM   Clinical Narrative:   Patient discharged from room 102 via [facility].  Friend and son at bedside. Name and number given to unit nurse to call report to [facility].  No further TOC needs.    Final next level of care: Skilled Nursing Facility Barriers to Discharge: Continued Medical Work up   Patient Goals and CMS Choice Patient states their goals for this hospitalization and ongoing recovery are:: to eventually get home          Discharge Placement              Patient chooses bed at: [facility] Patient to be transferred to facility by: [facility] Name of family member notified: Friend at bedside, patient Patient and family notified of of transfer: 01/02/25  Discharge Plan and Services Additional resources added to the After Visit Summary for     Discharge Planning Services: CM Consult                                 Social Drivers of Health (SDOH) Interventions SDOH Screenings   Food Insecurity: No Food Insecurity (12/30/2024)  Housing: Low Risk (12/30/2024)  Transportation Needs: No Transportation Needs (12/30/2024)  Utilities: Not At Risk (12/30/2024)  Financial Resource Strain: Low Risk  (07/28/2024)   Received from [facility]  Social Connections: Unknown (12/30/2024)  Tobacco Use: Low Risk (12/30/2024)     Readmission Risk Interventions     No data to display

## 2025-01-02 NOTE — Plan of Care (Signed)
" °  Problem: Education: Goal: Knowledge of secondary prevention will improve (MUST DOCUMENT ALL) Outcome: Progressing   Problem: Ischemic Stroke/TIA Tissue Perfusion: Goal: Complications of ischemic stroke/TIA will be minimized Outcome: Progressing   Problem: Health Behavior/Discharge Planning: Goal: Ability to manage health-related needs will improve Outcome: Progressing   Problem: Self-Care: Goal: Verbalization of feelings and concerns over difficulty with self-care will improve Outcome: Progressing   Problem: Education: Goal: Knowledge of General Education information will improve Description: Including pain rating scale, medication(s)/side effects and non-pharmacologic comfort measures Outcome: Progressing   "

## 2025-01-02 NOTE — Discharge Summary (Signed)
 " Physician Discharge Summary   Patient: Briana Lozano MRN: [id number] DOB: [birth year]  Admit date:     12/30/2024  Discharge date: 01/02/25  Discharge Physician: Briana Lozano   PCP: Briana Lenis, MD   Recommendations at discharge:   F/u PCP in 1-2 weeks  Discharge Diagnoses: Principal Problem:   Acute stroke due to ischemia [facility]) Active Problems:   Cerebral aneurysm without rupture   Accelerated hypertension   Hypothyroidism, unspecified   Depression   GERD (gastroesophageal reflux disease)   Orthostatic hypotension   Polyneuropathy   From H&P Briana Lozano is a 89 y.o. female with medical history significant of brain aneurysms.  She presents with left-sided weakness with dragging her left foot and no use of her finger since Thursday.  Patient states this happened before and she did not want to come into the ER because she thought it would get better.  She did not want to come to the ER on the weekend.  She came in today and a CT angiogram redemonstrates a large aneurysm originating from the right cavernous ICA extending into the right middle cranial fossa measuring 3 x 2.9 x 3 cm and the contrast portion appears increased in size.  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  MRI of the brain shows small acute infarcts in the posterior right corona radiata and basal ganglia.   Acute stroke due to ischemia [facility]) Patient already takes aspirin  will add Plavix .   Neurology consultation with dr Germaine appreciated.  PT and OT consultations.  Echocardiogram ordered.  Patient with weakness left hand and left foot. --in d/w Dr Marchelle ASA +Plavix  and then drop plavix  after 21 days and cont ASA alone  --start statins --PT/OT to see pt--rehab. TOC for d/c planning to [facility]   Cerebral aneurysm without rupture One of the aneurysms is larger than previous Has been seen by neurosurgery in the past Pt had decided against any intervention  for it in the past   Essential hypertension --BP overall ok. BP meds resumed   Polyneuropathy On gabapentin    Orthostatic hypotension (positional) Cont midodrine    GERD (gastroesophageal reflux disease) On Protonix    Depression On Celexa    Hypothyroidism, unspecified On levothyroxine   Overall improving. D/w family at bedside. Bed accepted for rehab per [facility]     Family communication :friend at bedside Consults : neurology CODE STATUS: DNR DVT Prophylaxis : Lovenox      Disposition: Rehabilitation facility Diet recommendation:  Discharge Diet Orders (From admission, onward)     Start     Ordered   01/02/25 0000  Diet - low sodium heart healthy        01/02/25 0900           Cardiac diet DISCHARGE MEDICATION: Allergies as of 01/02/2025       Reactions   Cortisone Swelling       Penicillins Swelling   Did it involve swelling of the face/tongue/throat, SOB, or low BP? No Did it involve sudden or severe rash/hives, skin peeling, or any reaction on the inside of your mouth or nose? No Did you need to seek medical attention at a hospital or doctor's office? No When did it last happen?      7-8 years If all above answers are NO, may proceed with cephalosporin use.   Valium [diazepam] Other (See Comments)   Couldn't walk        Medication List     STOP taking these medications  ibuprofen  400 MG tablet Commonly known as: ADVIL        TAKE these medications    acetaminophen  650 MG CR tablet Commonly known as: TYLENOL  Take 1,000 mg by mouth every 8 (eight) hours as needed for pain.   alendronate 70 MG tablet Commonly known as: FOSAMAX Take 70 mg by mouth once a week.   amLODipine  5 MG tablet Commonly known as: NORVASC  Take 1 tablet (5 mg total) by mouth daily. Start taking on: January 03, 2025   aspirin  EC 81 MG tablet Take 1 tablet (81 mg total) by mouth daily. Swallow whole. Start taking on: January 03, 2025   atorvastatin  40 MG  tablet Commonly known as: LIPITOR Take 1 tablet (40 mg total) by mouth daily. Start taking on: January 03, 2025   bisoprolol  5 MG tablet Commonly known as: ZEBETA  Take 0.5 tablets (2.5 mg total) by mouth daily as needed. If systolic BP >135   citalopram  10 MG tablet Commonly known as: CELEXA  Take 10 mg by mouth daily.   clopidogrel  75 MG tablet Commonly known as: PLAVIX  Take 1 tablet (75 mg total) by mouth daily for 21 days. Start taking on: January 03, 2025   cyanocobalamin  1000 MCG/ML injection Commonly known as: VITAMIN B12 Inject 1,000 mcg into the muscle every 30 (thirty) days.   feeding supplement Liqd Take 237 mLs by mouth 2 (two) times daily between meals.   gabapentin  300 MG capsule Commonly known as: NEURONTIN  Take 1 capsule (300 mg total) by mouth 3 (three) times daily.   levothyroxine  75 MCG tablet Commonly known as: SYNTHROID  Take 75 mcg by mouth daily before breakfast.   midodrine  2.5 MG tablet Commonly known as: PROAMATINE  Take 2.5 mg by mouth 3 (three) times daily with meals.   pantoprazole  40 MG tablet Commonly known as: PROTONIX  Take 1 tablet (40 mg total) by mouth at bedtime.   polyethylene glycol 17 g packet Commonly known as: MIRALAX  / GLYCOLAX  Take 17 g by mouth daily as needed for mild constipation.   pregabalin  25 MG capsule Commonly known as: LYRICA  Take 25 mg by mouth 2 (two) times daily.   senna 8.6 MG Tabs tablet Commonly known as: SENOKOT Take 1 tablet by mouth at bedtime.   SYSTANE OP Place 1 drop into both eyes daily as needed (dry eyes).   ZyrTEC  Allergy 10 MG tablet Generic drug: cetirizine  Take 1 tablet (10 mg total) by mouth daily.        Contact information for follow-up providers     Briana Lenis, MD Follow up.   Specialty: Family Medicine Why: hospital follow up Contact information: [address] [place] [address] [phone]              Contact information for after-discharge care      Destination     [facility] [place] .   Service: Skilled Nursing Contact information: [address] [place]  [address] [phone]                    Discharge Exam: Briana Lozano   12/30/24 1029  Weight: 52.2 kg  GENERAL:  89 y.o.-year-old patient with no acute distress.  LUNGS: Normal breath sounds bilaterally, no wheezing CARDIOVASCULAR: S1, S2 normal. No murmur   ABDOMEN: Soft, nontender, nondistended. EXTREMITIES: No  edema b/l.    NEUROLOGIC: patient is alert and awake left lower extremity mild weakness.[place] left hand grip Right eye-legally blind   Condition at discharge: fair  The results  of significant diagnostics from this hospitalization (including imaging, microbiology, ancillary and laboratory) are listed below for reference.   Imaging Studies: ECHOCARDIOGRAM COMPLETE Result Date: 12/31/2024    ECHOCARDIOGRAM REPORT   Patient Name:   ROYA GIESELMAN Date of Exam: 12/31/2024 Medical Rec #:  [id number]             Height:       63.0 in Accession #:    [id number]            Weight:       115.0 lb Date of Birth:  03/07/1929            BSA:          1.528 m Patient Age:    89 years              BP:           139/87 mmHg Patient Gender: F                     HR:           64 bpm. Exam Location:  [facility] Procedure: 2D Echo, Cardiac Doppler, Color Doppler, Strain Analysis and Saline            Contrast Bubble Study (Both Spectral and Color Flow Doppler were            utilized during procedure). Indications:     Stroke  History:         Patient has prior history of Echocardiogram examinations, most                  recent 09/20/2021. COPD and TIA; Risk Factors:Sleep Apnea and                  Hypertension.  Sonographer:     Philomena Daring Referring Phys:  [id number] CHARLIE PATTERSON Diagnosing Phys: Dwayne D Callwood MD  Sonographer Comments: Global longitudinal strain was attempted. IMPRESSIONS  1. Left ventricular ejection fraction, by estimation,  is 60 to 65%. The left ventricle has normal function. The left ventricle has no regional wall motion abnormalities. There is mild concentric left ventricular hypertrophy. Left ventricular diastolic parameters are consistent with Grade I diastolic dysfunction (impaired relaxation). The average left ventricular global longitudinal strain is 14.2 %. The global longitudinal strain is abnormal.  2. Right ventricular systolic function is normal. The right ventricular size is normal.  3. The mitral valve is grossly normal. Mild mitral valve regurgitation.  4. Tricuspid valve regurgitation is mild to moderate.  5. The aortic valve is grossly normal. Aortic valve regurgitation is trivial. Aortic valve sclerosis is present, with no evidence of aortic valve stenosis.  6. Agitated saline contrast bubble study was negative, with no evidence of any interatrial shunt. FINDINGS  Left Ventricle: Left ventricular ejection fraction, by estimation, is 60 to 65%. The left ventricle has normal function. The left ventricle has no regional wall motion abnormalities. The average left ventricular global longitudinal strain is 14.2 %. Strain was performed and the global longitudinal strain is abnormal. The left ventricular internal cavity size was normal in size. There is mild concentric left ventricular hypertrophy. Left ventricular diastolic parameters are consistent with Grade I diastolic dysfunction (impaired relaxation). Right Ventricle: The right ventricular size is normal. No increase in right ventricular wall thickness. Right ventricular systolic function is normal. Left Atrium: Left atrial size was normal in size. Right Atrium: Right atrial size was normal in size. Pericardium: The  pericardium was not assessed. Mitral Valve: The mitral valve is grossly normal. There is mild thickening of the mitral valve leaflet(s). There is mild calcification of the mitral valve leaflet(s). Mild mitral valve regurgitation. Tricuspid Valve: The  tricuspid valve is grossly normal. Tricuspid valve regurgitation is mild to moderate. Aortic Valve: The aortic valve is grossly normal. Aortic valve regurgitation is trivial. Aortic regurgitation PHT measures 842 msec. Aortic valve sclerosis is present, with no evidence of aortic valve stenosis. Pulmonic Valve: The pulmonic valve was not well visualized. Pulmonic valve regurgitation is not visualized. Aorta: The aortic root was not well visualized. IAS/Shunts: No atrial level shunt detected by color flow Doppler. Agitated saline contrast was given intravenously to evaluate for intracardiac shunting. Agitated saline contrast bubble study was negative, with no evidence of any interatrial shunt. Additional Comments: 3D was performed not requiring image post processing on an independent workstation and was indeterminate.  LEFT VENTRICLE PLAX 2D LVIDd:         3.00 cm   Diastology LVIDs:         2.00 cm   LV e' medial:    3.81 cm/s LV PW:         1.10 cm   LV E/e' medial:  17.9 LV IVS:        1.20 cm   LV e' lateral:   5.98 cm/s LVOT diam:     1.80 cm   LV E/e' lateral: 11.4 LV SV:         58 LV SV Index:   38        2D Longitudinal Strain LVOT Area:     2.54 cm  2D Strain GLS (A4C):   13.2 %                          2D Strain GLS (A3C):   12.4 %                          2D Strain GLS (A2C):   16.9 %                          2D Strain GLS Avg:     14.2 % RIGHT VENTRICLE             IVC RV Basal diam:  3.60 cm     IVC diam: 1.20 cm RV Mid diam:    2.50 cm RV S prime:     11.70 cm/s TAPSE (M-mode): 1.7 cm LEFT ATRIUM             Index        RIGHT ATRIUM           Index LA diam:        2.80 cm 1.83 cm/m   RA Area:     14.00 cm LA Vol (A2C):   38.7 ml 25.32 ml/m  RA Volume:   36.40 ml  23.82 ml/m LA Vol (A4C):   25.6 ml 16.75 ml/m LA Biplane Vol: 32.6 ml 21.33 ml/m  AORTIC VALVE LVOT Vmax:   107.00 cm/s LVOT Vmean:  70.700 cm/s LVOT VTI:    0.229 m AI PHT:      842 msec  AORTA Ao Root diam: 2.60 cm MITRAL VALVE                 TRICUSPID VALVE MV Area (PHT): 2.33 cm  TR Peak grad:   28.5 mmHg MV Decel Time: 326 msec     TR Vmax:        267.00 cm/s MV E velocity: 68.10 cm/s MV A velocity: 104.00 cm/s  SHUNTS MV E/A ratio:  0.65         Systemic VTI:  0.23 m                             Systemic Diam: 1.80 cm Cara JONETTA Lovelace MD Electronically signed by Cara JONETTA Lovelace MD Signature Date/Time: 12/31/2024/4:24:58 PM    Final    MR BRAIN WO CONTRAST Result Date: 12/30/2024 EXAM: MRI BRAIN WITHOUT CONTRAST 12/30/2024 03:00:03 PM TECHNIQUE: Multiplanar multisequence MRI of the head/brain was performed without the administration of intravenous contrast. COMPARISON: CTA head and neck 12/30/2024 and MRI head 09/19/2021. CLINICAL HISTORY: Neuro deficit, acute, stroke suspected. FINDINGS: BRAIN AND VENTRICLES: Clustered small acute infarcts involve the posterior right corona radiata and basal ganglia. There are 2 chronic microhemorrhages in the pons which are new from 2022. Patchy to confluent T2 hyperintensities in the cerebral white matter, deep gray nuclei, and brainstem have progressed from the prior MRI and are nonspecific but compatible with moderate to severe extensive chronic small vessel ischemic disease. There is mild cerebral atrophy. Major intracranial arterial flow voids are preserved. A giant right ICA aneurysm is again noted, more fully evaluated on the recent CTA. Bilateral cataract extraction. ORBITS: Bilateral cataract extraction. SINUSES AND MASTOIDS: Mild mucosal thickening in the paranasal sinuses. Clear mastoid air cells. BONES AND SOFT TISSUES: Normal marrow signal. No soft tissue abnormality. IMPRESSION: 1. Small acute infarcts in the posterior right corona radiata and basal ganglia. 2. Moderate to severe extensive chronic small vessel ischemic disease. 3. Known giant right ICA aneurysm. Electronically signed by: Dasie Hamburg MD 12/30/2024 03:58 PM EST RP Workstation: [id number]   CT ANGIO HEAD NECK W WO  CM Result Date: 12/30/2024 EXAM: CTA HEAD AND NECK WITH AND WITHOUT 12/30/2024 11:41:52 AM TECHNIQUE: CTA of the head and neck was performed with and without the administration of 75 mL of iohexol  (OMNIPAQUE ) 350 MG/ML injection. Multiplanar 2D and/or 3D reformatted images are provided for review. Automated exposure control, iterative reconstruction, and/or weight based adjustment of the mA/kV was utilized to reduce the radiation dose to as low as reasonably achievable. Stenosis of the internal carotid arteries measured using NASCET criteria. COMPARISON: CTA head 06/25/2022 and MRI head 09/19/2021. CLINICAL HISTORY: Neuro deficit, acute, stroke suspected. FINDINGS: CTA NECK: AORTIC ARCH AND ARCH VESSELS: Mild atherosclerosis of the aortic arch. Mild atherosclerosis along the left subclavian artery without significant stenosis. The left vertebral artery originates on the aortic arch. No dissection or arterial injury. No significant stenosis of the brachiocephalic arteries. CERVICAL CAROTID ARTERIES: Atherosclerosis of the right carotid bifurcation without hemodynamically significant stenosis. Partial retropharyngeal course of the proximal right cervical ICA. Atherosclerosis at the left carotid bifurcation without hemodynamically significant stenosis. Redemonstrated multifocal irregularity with areas of subtle narrowing and outpouching of the distal left cervical ICA likely related to fibromuscular dysplasia. No dissection or arterial injury. CERVICAL VERTEBRAL ARTERIES: Tortuosity of the V1 segment of the right vertebral artery. There is irregularity and subtle narrowing of the right V1 segment which may be related to fibromuscular dysplasia. There is a 5 x 4 mm outpouching along the V1 segment of the left vertebral artery concerning for aneurysm (series 11 image 171, series 10 image 232). No dissection or arterial injury.  LUNGS AND MEDIASTINUM: Unremarkable. SOFT TISSUES: Left thyroid lobe nodule measuring up to  1.0 cm. No acute abnormality. BONES: Degenerative changes in the visualized spine. Edentulous maxilla. No acute abnormality. CTA HEAD: ANTERIOR CIRCULATION: Redemonstrated slightly hyperattenuating aneurysm sac in the right middle cranial fossa which measures 3.0 x 2.9 x 3.0 cm (previously measuring 2.5 x 3.0 x 2.6 cm). There is associated remodeling of bone along the floor of the right middle cranial fossa. Enhancing portion of the aneurysm sac measures 2.4 x 2.5 x 1.6 cm. The inferior portion of the aneurysm is nonenhancing similar to the prior study suggestive of partial thrombosis. The aneurysm arises from the right cavernous ICA. Similar mass effect on the right lateral rectus with associated esotropia. There is a wide aneurysm neck measuring at least 8 mm in length. There is an additional aneurysm along the right supraclinoid ICA which measures 5 x 4 mm, similar to prior. Atherosclerosis of the carotid siphons without high grade stenosis. There is early bifurcation versus duplication of the left M1 segment. Mild narrowing of multiple M2 branches of the left MCA. No significant stenosis of the anterior cerebral arteries. POSTERIOR CIRCULATION: Fetal origin of the right PCA. No significant stenosis of the posterior cerebral arteries. No significant stenosis of the basilar artery. No significant stenosis of the vertebral arteries. No aneurysm. OTHER: There are similar chronic microvascular ischemic changes and remote lacunar infarcts in the bilateral basal ganglia which appear new since the 2023 study. Mild parenchymal volume loss. Replacement mucosal thickening in the right sphenoid sinus findings suggestive of chronic sphenoid sinusitis. There is a spot rounded focus of soft tissue within the right sphenoid sinus which is lower to prior and could reflect prominent secretions versus fungal mycetoma. No dural venous sinus thrombosis on this non-dedicated study. IMPRESSION: 1. Redemonstrated large aneurysm  originating from the right cavernous ICA extending into the right middle cranial fossa, measuring 3.0 x 2.9 x 3.0 cm, previously 2.5 x 3.0 x 2.6 cm. The contrast filling portion of the aneurysm appears increased in size. Additional non-filling portion of the aneurysm sac inferiorly, likely reflects partial thrombosis. 2. 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 3. Outpouching along the V1 segment of the left vertebral artery, measuring 5 x 4 mm, concerning for aneurysm. 4. Multifocal irregularity with areas of subtle narrowing and outpouching of the distal left cervical ICA and V1 segments of the vertebral arteries, likely related to fibromuscular dysplasia. 5. Findings suggestive of chronic sphenoid sinusitis with a rounded focus of soft tissue within the right sphenoid sinus, which could reflect prominent secretions versus fungal mycetoma. Electronically signed by: Donnice Mania MD 12/30/2024 12:43 PM EST RP Workstation: [id number]    Microbiology: Results for orders placed or performed during the hospital encounter of 09/19/21  Resp Panel by RT-PCR (Flu A&B, Covid) Nasopharyngeal Swab     Status: None   Collection Time: 09/20/21  6:35 AM   Specimen: Nasopharyngeal Swab; Nasopharyngeal(NP) swabs in vial transport medium  Result Value Ref Range Status   SARS Coronavirus 2 by RT PCR NEGATIVE NEGATIVE Final    Comment: (NOTE) SARS-CoV-2 target nucleic acids are NOT DETECTED.  The SARS-CoV-2 RNA is generally detectable in upper respiratory specimens during the acute phase of infection. The lowest concentration of SARS-CoV-2 viral copies this assay can detect is 138 copies/mL. A negative result does not preclude SARS-Cov-2 infection and should not be used as the sole basis for treatment or other patient management decisions.  A negative result may occur with  improper specimen collection/handling, submission of specimen other than  nasopharyngeal swab, presence of viral mutation(s) within the areas targeted by this assay, and inadequate number of viral copies(<138 copies/mL). A negative result must be combined with clinical observations, patient history, and epidemiological information. The expected result is Negative.  Fact Sheet for Patients:  bloggercourse.com  Fact Sheet for Healthcare Providers:  seriousbroker.it  This test is no t yet approved or cleared by the [place]  FDA and  has been authorized for detection and/or diagnosis of SARS-CoV-2 by FDA under an Emergency Use Authorization (EUA). This EUA will remain  in effect (meaning this test can be used) for the duration of the COVID-19 declaration under Section 564(b)(1) of the Act, 21 U.S.C.section 360bbb-3(b)(1), unless the authorization is terminated  or revoked sooner.       Influenza A by PCR NEGATIVE NEGATIVE Final   Influenza B by PCR NEGATIVE NEGATIVE Final    Comment: (NOTE) The Xpert Xpress SARS-CoV-2/FLU/RSV plus assay is intended as an aid in the diagnosis of influenza from Nasopharyngeal swab specimens and should not be used as a sole basis for treatment. Nasal washings and aspirates are unacceptable for Xpert Xpress SARS-CoV-2/FLU/RSV testing.  Fact Sheet for Patients: bloggercourse.com  Fact Sheet for Healthcare Providers: seriousbroker.it  This test is not yet approved or cleared by the [place]  FDA and has been authorized for detection and/or diagnosis of SARS-CoV-2 by FDA under an Emergency Use Authorization (EUA). This EUA will remain in effect (meaning this test can be used) for the duration of the COVID-19 declaration under Section 564(b)(1) of the Act, 21 U.S.C. section 360bbb-3(b)(1), unless the authorization is terminated or revoked.  Performed at [facility], [address] [place], [place]  [address]   Urine Culture     Status: Abnormal   Collection Time: 09/22/21  7:11 AM   Specimen: Urine, Clean Catch  Result Value Ref Range Status   Specimen Description   Final    URINE, CLEAN CATCH Performed at [facility], [address] [place], [place] [address]    Special Requests   Final    Normal Performed at [facility], [address] [place], [place] [address]    Culture >=100,000 COLONIES/mL ESCHERICHIA COLI (A)  Final   Report Status 09/25/2021 FINAL  Final   Organism ID, Bacteria ESCHERICHIA COLI (A)  Final      Susceptibility   Escherichia coli - MIC*    AMPICILLIN 8 SENSITIVE Sensitive     CEFAZOLIN <=4 SENSITIVE Sensitive     CEFEPIME <=0.12 SENSITIVE Sensitive     CEFTRIAXONE <=0.25 SENSITIVE Sensitive     CIPROFLOXACIN <=0.25 SENSITIVE Sensitive     GENTAMICIN <=1 SENSITIVE Sensitive     IMIPENEM <=0.25 SENSITIVE Sensitive     NITROFURANTOIN <=16 SENSITIVE Sensitive     TRIMETH /SULFA  <=20 SENSITIVE Sensitive     AMPICILLIN/SULBACTAM 4 SENSITIVE Sensitive     PIP/TAZO <=4 SENSITIVE Sensitive     * >=100,000 COLONIES/mL ESCHERICHIA COLI  Resp Panel by RT-PCR (Flu A&B, Covid) Nasopharyngeal Swab     Status: None   Collection Time: 09/23/21 11:30 AM   Specimen: Nasopharyngeal Swab; Nasopharyngeal(NP) swabs in vial transport medium  Result Value Ref Range Status   SARS Coronavirus 2 by RT PCR NEGATIVE NEGATIVE Final    Comment: (NOTE) SARS-CoV-2 target nucleic acids are NOT DETECTED.  The SARS-CoV-2 RNA is generally detectable in upper respiratory specimens during the acute phase  of infection. The lowest concentration of SARS-CoV-2 viral copies this assay can detect is 138 copies/mL. A negative result does not preclude SARS-Cov-2 infection and should not be used as the sole basis for treatment or other patient management decisions. A negative result may occur with  improper specimen collection/handling, submission of specimen other than  nasopharyngeal swab, presence of viral mutation(s) within the areas targeted by this assay, and inadequate number of viral copies(<138 copies/mL). A negative result must be combined with clinical observations, patient history, and epidemiological information. The expected result is Negative.  Fact Sheet for Patients:  bloggercourse.com  Fact Sheet for Healthcare Providers:  seriousbroker.it  This test is no t yet approved or cleared by the [place]  FDA and  has been authorized for detection and/or diagnosis of SARS-CoV-2 by FDA under an Emergency Use Authorization (EUA). This EUA will remain  in effect (meaning this test can be used) for the duration of the COVID-19 declaration under Section 564(b)(1) of the Act, 21 U.S.C.section 360bbb-3(b)(1), unless the authorization is terminated  or revoked sooner.       Influenza A by PCR NEGATIVE NEGATIVE Final   Influenza B by PCR NEGATIVE NEGATIVE Final    Comment: (NOTE) The Xpert Xpress SARS-CoV-2/FLU/RSV plus assay is intended as an aid in the diagnosis of influenza from Nasopharyngeal swab specimens and should not be used as a sole basis for treatment. Nasal washings and aspirates are unacceptable for Xpert Xpress SARS-CoV-2/FLU/RSV testing.  Fact Sheet for Patients: bloggercourse.com  Fact Sheet for Healthcare Providers: seriousbroker.it  This test is not yet approved or cleared by the [place]  FDA and has been authorized for detection and/or diagnosis of SARS-CoV-2 by FDA under an Emergency Use Authorization (EUA). This EUA will remain in effect (meaning this test can be used) for the duration of the COVID-19 declaration under Section 564(b)(1) of the Act, 21 U.S.C. section 360bbb-3(b)(1), unless the authorization is terminated or revoked.  Performed at [facility], [address] [place], [place]  [address]     Labs: CBC: Recent Labs  Lab 12/30/24 1040 12/31/24 0450  WBC 5.5 7.4  NEUTROABS 2.9  --   HGB 13.2 13.1  HCT 39.7 38.3  MCV 103.1* 100.0  PLT 182 192   Basic Metabolic Panel: Recent Labs  Lab 12/30/24 1040 12/31/24 0450  NA 138 136  K 4.0 3.9  CL 102 101  CO2 29 26  GLUCOSE 90 87  BUN 13 10  CREATININE 0.71 0.61  CALCIUM  9.1 9.1   Liver Function Tests: Recent Labs  Lab 12/30/24 1040  AST 19  ALT 7  ALKPHOS 37*  BILITOT 0.4  PROT 6.1*  ALBUMIN 3.7   CBG: Recent Labs  Lab 01/01/25 0818 01/01/25 1205  GLUCAP 84 191*    Discharge time spent: greater than 30 minutes.  Signed: Leita Blanch, MD [facility] 01/02/2025 "

## 2025-01-02 NOTE — Care Management Important Message (Signed)
 Important Message  Patient Details  Name: Briana Lozano MRN: [id number] Date of Birth: [birth year]   Important Message Given:  Yes - Medicare IM     Inocente Krach W, CMA 01/02/2025, 2:07 PM

## 2025-01-02 NOTE — Progress Notes (Signed)
 Pt report called for Pt [facility], all questions answered
